# Patient Record
Sex: Female | Born: 1965 | Race: White | Hispanic: No | Marital: Married | Smoking: Current every day smoker
Health system: Southern US, Community
[De-identification: ages and names within clinical notes are randomized; demographics above are authoritative.]

## PROBLEM LIST (undated history)

## (undated) DIAGNOSIS — I10 Essential (primary) hypertension: Secondary | ICD-10-CM

## (undated) DIAGNOSIS — I509 Heart failure, unspecified: Secondary | ICD-10-CM

## (undated) DIAGNOSIS — M1990 Unspecified osteoarthritis, unspecified site: Secondary | ICD-10-CM

## (undated) DIAGNOSIS — J45909 Unspecified asthma, uncomplicated: Secondary | ICD-10-CM

## (undated) HISTORY — PX: TUBAL LIGATION: SHX77

## (undated) HISTORY — PX: CHOLECYSTECTOMY: SHX55

## (undated) HISTORY — DX: Essential (primary) hypertension: I10

## (undated) HISTORY — DX: Unspecified osteoarthritis, unspecified site: M19.90

## (undated) HISTORY — PX: APPENDECTOMY: SHX54

## (undated) HISTORY — DX: Heart failure, unspecified: I50.9

## (undated) HISTORY — DX: Unspecified asthma, uncomplicated: J45.909

---

## 2018-05-02 DIAGNOSIS — I161 Hypertensive emergency: Secondary | ICD-10-CM | POA: Insufficient documentation

## 2020-07-30 ENCOUNTER — Encounter: Payer: Self-pay | Admitting: Internal Medicine

## 2020-07-30 ENCOUNTER — Ambulatory Visit (INDEPENDENT_AMBULATORY_CARE_PROVIDER_SITE_OTHER): Payer: 59 | Admitting: Internal Medicine

## 2020-07-30 ENCOUNTER — Other Ambulatory Visit: Payer: Self-pay

## 2020-07-30 VITALS — BP 139/85 | HR 82 | Temp 98.0°F | Ht 62.72 in | Wt 235.0 lb

## 2020-07-30 DIAGNOSIS — I509 Heart failure, unspecified: Secondary | ICD-10-CM | POA: Diagnosis not present

## 2020-07-30 DIAGNOSIS — L0291 Cutaneous abscess, unspecified: Secondary | ICD-10-CM | POA: Diagnosis not present

## 2020-07-30 DIAGNOSIS — Z1239 Encounter for other screening for malignant neoplasm of breast: Secondary | ICD-10-CM | POA: Diagnosis not present

## 2020-07-30 LAB — URINALYSIS, ROUTINE W REFLEX MICROSCOPIC
Bilirubin, UA: NEGATIVE
Glucose, UA: NEGATIVE
Ketones, UA: NEGATIVE
Leukocytes,UA: NEGATIVE
Nitrite, UA: NEGATIVE
Protein,UA: NEGATIVE
Specific Gravity, UA: 1.015 (ref 1.005–1.030)
Urobilinogen, Ur: 0.2 mg/dL (ref 0.2–1.0)
pH, UA: 6 (ref 5.0–7.5)

## 2020-07-30 LAB — BAYER DCA HB A1C WAIVED: HB A1C (BAYER DCA - WAIVED): 5.9 %

## 2020-07-30 LAB — MICROSCOPIC EXAMINATION
Bacteria, UA: NONE SEEN
WBC, UA: NONE SEEN /HPF (ref 0–5)

## 2020-07-30 MED ORDER — HYDROCHLOROTHIAZIDE 12.5 MG PO CAPS: 12.5000 mg | ORAL_CAPSULE | Freq: Every day | ORAL | 3 refills | Status: DC

## 2020-07-30 MED ORDER — NYSTATIN 100000 UNIT/GM EX CREA: 1.0000 "application " | TOPICAL_CREAM | Freq: Two times a day (BID) | CUTANEOUS | 1 refills | Status: DC

## 2020-07-30 MED ORDER — SULFAMETHOXAZOLE-TRIMETHOPRIM 800-160 MG PO TABS: 1.0000 | ORAL_TABLET | Freq: Two times a day (BID) | ORAL | 0 refills | Status: AC

## 2020-07-30 MED ORDER — NAPROXEN 500 MG PO TABS: 500.0000 mg | ORAL_TABLET | Freq: Two times a day (BID) | ORAL | 2 refills | Status: DC | PRN

## 2020-07-30 NOTE — Progress Notes (Signed)
BP 139/85   Pulse 82   Temp 98 F (36.7 C) (Oral)   Ht 5' 2.72" (1.593 m)   Wt 235 lb (106.6 kg)   LMP  (LMP Unknown)   SpO2 98%   BMI 42.00 kg/m    Subjective:    Patient ID: Jodi Keith, female    DOB: 12/30/1965, 55 y.o.   MRN: [id number]  HPI: Jodi Keith is a 55 y.o. female  Pt is here to establish care - hasnt been seen  She has a ho of CHF not on meds currently has left lower ext, CAD  , asthma, and tobacco use presenting to the ED x 4 /4 for 1 month of left-sided flank pain and swelling as well as with 3 days of increased urinary frequency and urgency that acutely worsened yesterday afternoon  Per pt notcied some lumps / has pus and discharge from the ;left breast with erythema under bil breasts.   Hypertension This is a chronic problem. The problem is resistant. Pertinent negatives include no anxiety, blurred vision, chest pain, headaches, malaise/fatigue, neck pain, orthopnea, palpitations, peripheral edema, PND, shortness of breath or sweats.  Abdominal Pain This is a recurrent problem. The current episode started more than 1 month ago. The problem has been waxing and waning. The pain is located in the LUQ. The pain is at a severity of 5/10. The pain is moderate. The quality of the pain is dull. The abdominal pain does not radiate. Pertinent negatives include no flatus, headaches, melena, myalgias, nausea, vomiting or weight loss.    Chief Complaint  Patient presents with  . New Patient (Initial Visit)    UC week ago, pain left side under breast with swelling, started 2 months just now getting worse, at the worst when she lays down. Found blood in urine, went to ED, found cyst in right ovary. Has family h/o cancer, seeing gyno on 4/18. Trouble sleeping mind does not stop, has restless legs, take ibuprofen daily, HTN. Father, sister, stepmother and cousins have pasted in past 2 years.Left leg swelling    Relevant past medical, surgical, family and social history  reviewed and updated as indicated. Interim medical history since our last visit reviewed. Allergies and medications reviewed and updated.  Review of Systems  Constitutional: Negative for malaise/fatigue and weight loss.  Eyes: Negative for blurred vision.  Respiratory: Negative for shortness of breath.   Cardiovascular: Negative for chest pain, palpitations, orthopnea and PND.  Gastrointestinal: Positive for abdominal pain. Negative for flatus, melena, nausea and vomiting.  Musculoskeletal: Negative for myalgias and neck pain.  Neurological: Negative for headaches.    Per HPI unless specifically indicated above     Objective:    BP 139/85   Pulse 82   Temp 98 F (36.7 C) (Oral)   Ht 5' 2.72" (1.593 m)   Wt 235 lb (106.6 kg)   LMP  (LMP Unknown)   SpO2 98%   BMI 42.00 kg/m   Wt Readings from Last 3 Encounters:  07/30/20 235 lb (106.6 kg)    Physical Exam Vitals and nursing note reviewed.  Constitutional:      General: She is not in acute distress.    Appearance: Normal appearance. She is not ill-appearing or diaphoretic.  HENT:     Head: Normocephalic and atraumatic.     Right Ear: Tympanic membrane and external ear normal. There is no impacted cerumen.     Left Ear: External ear normal.     Nose: No congestion  or rhinorrhea.     Mouth/Throat:     Pharynx: No oropharyngeal exudate or posterior oropharyngeal erythema.  Eyes:     Conjunctiva/sclera: Conjunctivae normal.     Pupils: Pupils are equal, round, and reactive to light.  Cardiovascular:     Rate and Rhythm: Normal rate and regular rhythm.     Heart sounds: No murmur heard. No friction rub. No gallop.   Pulmonary:     Effort: No respiratory distress.     Breath sounds: No stridor. No wheezing or rhonchi.  Chest:     Chest wall: No tenderness.  Abdominal:     General: Bowel sounds are normal. There is no distension.     Palpations: Abdomen is soft. There is no shifting dullness, hepatomegaly or mass.      Tenderness: There is no abdominal tenderness. There is no guarding.  Musculoskeletal:        General: No swelling or deformity.     Cervical back: Normal range of motion and neck supple. No rigidity or tenderness.     Right lower leg: No edema.     Left lower leg: No edema.  Skin:    General: Skin is warm and dry.     Coloration: Skin is not jaundiced.     Findings: Erythema and rash present.     Comments: umps / has pus and discharge from the ;left breast with erythema under bil breasts.   Neurological:     Mental Status: She is alert and oriented to person, place, and time. Mental status is at baseline.  Psychiatric:        Mood and Affect: Mood normal.        Behavior: Behavior normal.        Thought Content: Thought content normal.        Judgment: Judgment normal.     No results found for this or any previous visit.      Current Outpatient Medications:  .  amLODipine (NORVASC) 10 MG tablet, Take 1 tablet by mouth daily., Disp: , Rfl:  .  losartan (COZAAR) 25 MG tablet, Take 1 tablet by mouth daily., Disp: , Rfl:   3.8 cm right ovarian cyst is noted. Recommend followup US in 3-6  months. Note: This recommendation does not apply to premenarchal  patients or to those with increased risk (genetic, family history,  elevated tumor markers or other high-risk factors) of ovarian  cancer. Reference: Radiology 2019 Nov; 293(2):359-371.    Electronically Signed  By: Lupita Raider M.D.  On: 07/21/2020 13:24  Narrative Performed by [facility] CLINICAL DATA: Acute left upper quadrant abdominal pain.   EXAM:  TRANSABDOMINAL AND TRANSVAGINAL ULTRASOUND OF PELVIS   DOPPLER ULTRASOUND OF OVARIES   TECHNIQUE:  Both transabdominal and transvaginal ultrasound examinations of the  pelvis were performed. Transabdominal technique was performed for  global imaging of the pelvis including uterus, ovaries, adnexal  regions, and pelvic cul-de-sac.   It was necessary to proceed with  endovaginal exam following the  transabdominal exam to visualize the endometrium and ovaries. Color  and duplex Doppler ultrasound was utilized to evaluate blood flow to  the ovaries.   COMPARISON: CT of same day.   FINDINGS:  Uterus   Measurements: 7.2 x 4.3 x 3.4 cm = volume: 55 mL. No fibroids or  other mass visualized.   Endometrium   Thickness: 8 mm which is within normal limits. No focal abnormality  visualized.   Right ovary   Measurements: 4.6  x 3.8 x 3.1 cm = volume: 28 mL. 3.8 cm cyst is  noted.   Left ovary   Not visualized.   Pulsed Doppler evaluation of right ovary demonstrates normal  low-resistance arterial and venous waveforms.   Other findings   No abnormal free fluid.  Procedure Note  Orlene OchGreen, James Judd, MD - 07/21/2020  Formatting of this note might be different from the original.  CLINICAL DATA: Acute left upper quadrant abdominal pain.   EXAM:  TRANSABDOMINAL AND TRANSVAGINAL ULTRASOUND OF PELVIS   DOPPLER ULTRASOUND OF OVARIES   TECHNIQUE:  Both transabdominal and transvaginal ultrasound examinations of the  pelvis were performed. Transabdominal technique was performed for  global imaging of the pelvis including uterus, ovaries, adnexal  regions, and pelvic cul-de-sac.   It was necessary to proceed with endovaginal exam following the  transabdominal exam to visualize the endometrium and ovaries. Color  and duplex Doppler ultrasound was utilized to evaluate blood flow to  the ovaries.   COMPARISON: CT of same day.   FINDINGS:  Uterus   Measurements: 7.2 x 4.3 x 3.4 cm = volume: 55 mL. No fibroids or  other mass visualized.   Endometrium   Thickness: 8 mm which is within normal limits. No focal abnormality  visualized.   Right ovary   Measurements: 4.6 x 3.8 x 3.1 cm = volume: 28 mL. 3.8 cm cyst is  noted.   Left ovary   Not visualized.   Pulsed Doppler evaluation of right ovary demonstrates normal  low-resistance  arterial and venous waveforms.   Other findings   No abnormal free fluid.   IMPRESSION:  3.8 cm right ovarian cyst is noted. Recommend followup US in 3-6  months. Note: This recommendation does not apply to premenarchal  patients or to those with increased risk (genetic, family history,  elevated tumor markers or other high-risk factors) of ovarian  cancer. Reference: Radiology 2019 Nov; 293(2):359-371.     Assessment & Plan:  1. Breast abcess  Will start pt on bactrim DS. noncontast CT scan - spleen wnl , no paraspleenic per read @ [facility]  Pain left sided LUQ / Lower chest  ? Sec to musculoskeletal sec to morbid obesity per radiologist@ [facility] , recommend  Iv contrast CT abdomen only if she is worse Has had a ? Previous infection in rt kidney. Will start pt on naproxen for pain  She has had -ve xray ribs  Has had multiple images and per radiology MD, pt doesn't need any further imaging unless she gets worse . Will refert to cards for wu of pain as well.  2. CHF /lower extremity swelling well start patient on HCTZ  refer to cardiology ASAP for possible echo/ stress test.   3. morbid obesity Lifestyle modifications advised to pt. A1c at   Portion control and avoiding high carb low fat diet advised.  Diet plan given to pt   exercise plan given and encouraged.  To increase exercise to 150 mins a week ie 21/2 hours a week. Pt verbalises understanding of the above.    4. Smoking cessation Smoking cessation advised. pt refuses chantix. failed nicotine patches in the past. continues to smoke. more than > 5 - 10 mins of time was spent with pt regarding smoking cessation and complications.      Problem List Items Addressed This Visit   None   Visit Diagnoses    Congestive heart failure, unspecified HF chronicity, unspecified heart failure type (HCC)    -  Primary   Relevant Medications   amLODipine (NORVASC) 10 MG tablet   losartan (COZAAR) 25 MG tablet   Other Relevant Orders    Ambulatory referral to Cardiology   CBC with Differential/Platelet   Comprehensive metabolic panel   Lipid panel   TSH   Urinalysis, Routine w reflex microscopic   Morbid obesity (HCC)       Relevant Orders   Bayer DCA Hb A1c Waived   Encounter for screening for malignant neoplasm of breast, unspecified screening modality       Relevant Orders   MM 3D SCREEN BREAST BILATERAL       Follow up plan: Return in about 1 week (around 08/06/2020).

## 2020-07-31 LAB — COMPREHENSIVE METABOLIC PANEL
ALT: 23 IU/L (ref 0–32)
AST: 20 IU/L (ref 0–40)
Albumin/Globulin Ratio: 1.3 (ref 1.2–2.2)
Albumin: 4.3 g/dL (ref 3.8–4.9)
Alkaline Phosphatase: 149 IU/L — ABNORMAL HIGH (ref 44–121)
BUN/Creatinine Ratio: 11 (ref 9–23)
BUN: 11 mg/dL (ref 6–24)
Bilirubin Total: 0.3 mg/dL (ref 0.0–1.2)
CO2: 21 mmol/L (ref 20–29)
Calcium: 9.5 mg/dL (ref 8.7–10.2)
Chloride: 101 mmol/L (ref 96–106)
Creatinine, Ser: 1.04 mg/dL — ABNORMAL HIGH (ref 0.57–1.00)
Globulin, Total: 3.2 g/dL (ref 1.5–4.5)
Glucose: 90 mg/dL (ref 65–99)
Potassium: 4.3 mmol/L (ref 3.5–5.2)
Sodium: 139 mmol/L (ref 134–144)
Total Protein: 7.5 g/dL (ref 6.0–8.5)
eGFR: 64 mL/min/{1.73_m2} (ref >59)

## 2020-07-31 LAB — CBC WITH DIFFERENTIAL/PLATELET
Basophils Absolute: 0.1 10*3/uL (ref 0.0–0.2)
Basos: 1 %
EOS (ABSOLUTE): 0.1 10*3/uL (ref 0.0–0.4)
Eos: 2 %
Hematocrit: 48.7 % — ABNORMAL HIGH (ref 34.0–46.6)
Hemoglobin: 16.3 g/dL — ABNORMAL HIGH (ref 11.1–15.9)
Immature Grans (Abs): 0 10*3/uL (ref 0.0–0.1)
Immature Granulocytes: 0 %
Lymphocytes Absolute: 2.8 10*3/uL (ref 0.7–3.1)
Lymphs: 37 %
MCH: 29.9 pg (ref 26.6–33.0)
MCHC: 33.5 g/dL (ref 31.5–35.7)
MCV: 89 fL (ref 79–97)
Monocytes Absolute: 0.4 10*3/uL (ref 0.1–0.9)
Monocytes: 6 %
Neutrophils Absolute: 4.1 10*3/uL (ref 1.4–7.0)
Neutrophils: 54 %
Platelets: 335 10*3/uL (ref 150–450)
RBC: 5.46 x10E6/uL — ABNORMAL HIGH (ref 3.77–5.28)
RDW: 14.1 % (ref 11.7–15.4)
WBC: 7.5 10*3/uL (ref 3.4–10.8)

## 2020-07-31 LAB — LIPID PANEL
Chol/HDL Ratio: 4.7 ratio — ABNORMAL HIGH (ref 0.0–4.4)
Cholesterol, Total: 183 mg/dL (ref 100–199)
HDL: 39 mg/dL — ABNORMAL LOW (ref >39)
LDL Chol Calc (NIH): 115 mg/dL — ABNORMAL HIGH (ref 0–99)
Triglycerides: 162 mg/dL — ABNORMAL HIGH (ref 0–149)
VLDL Cholesterol Cal: 29 mg/dL (ref 5–40)

## 2020-07-31 LAB — TSH: TSH: 7.72 u[IU]/mL — ABNORMAL HIGH (ref 0.450–4.500)

## 2020-08-04 ENCOUNTER — Other Ambulatory Visit (HOSPITAL_COMMUNITY)
Admission: RE | Admit: 2020-08-04 | Discharge: 2020-08-04 | Disposition: A | Discharge status: Home / Self Care | Payer: 59 | Source: Ambulatory Visit | Attending: Obstetrics and Gynecology | Admitting: Obstetrics and Gynecology

## 2020-08-04 ENCOUNTER — Ambulatory Visit (INDEPENDENT_AMBULATORY_CARE_PROVIDER_SITE_OTHER): Payer: 59 | Admitting: Obstetrics and Gynecology

## 2020-08-04 ENCOUNTER — Encounter: Payer: Self-pay | Admitting: Obstetrics and Gynecology

## 2020-08-04 ENCOUNTER — Other Ambulatory Visit: Payer: Self-pay

## 2020-08-04 ENCOUNTER — Telehealth: Payer: Self-pay | Admitting: Internal Medicine

## 2020-08-04 VITALS — BP 126/84 | Wt 237.0 lb

## 2020-08-04 DIAGNOSIS — N83201 Unspecified ovarian cyst, right side: Secondary | ICD-10-CM

## 2020-08-04 DIAGNOSIS — Z124 Encounter for screening for malignant neoplasm of cervix: Secondary | ICD-10-CM

## 2020-08-04 NOTE — Telephone Encounter (Signed)
Pt is calling to follow up regarding referral to cardiology and her mamogram. Pt was told to have her mamogram before she returns this Wednesday.  Cb- [phone]

## 2020-08-04 NOTE — Telephone Encounter (Signed)
Advised cardiology referral is waiting on notes she can call [place] to schedule

## 2020-08-04 NOTE — Progress Notes (Signed)
Obstetrics & Gynecology Office Visit    Chief Complaint  Patient presents with  . Follow-up  . Ovarian Cyst  Right ovarian cyst  History of Present Illness: 55 y.o. G58P3003 female who presents in follow up from an ER visit where a right ovarian cyst was found. She went to the ER on 07/21/20 due to left lower quadrant pain and lower back pain.  The pain in her back starts from her mid sacrum to her lateral right gluteal muscle.  She had blood noted in her urine. So, she had a stone-protocol CT scan, which showed a right ovarian cyst.  A pelvic ultrasound was utilized to get a better view of her right ovary.  An uncharacterized right ovarian cyst was noted (3.8 cm).  She doesn't remember the last time she had an ultrasound of her pelvis. Her mother had ovarian cancer.  Her mother had cancer at multiple sites. It is hard to determine where the primary was located or whether there was more than one primary.  On CT scan her lymph nodes were not noted to be enlarged.  No ascites was noted. No comment regarding omental caking was noted.   She notes weight gain. She believes the weight gain is "a lot." She has recently been placed on a "fluid" pill and she has lost weight on that.  She denies new constipation.  She states that she hasn't had a solid bowel movement in 3 years.  She notes new bloating in the past months. She does notice early satiety.    She smokes 1 ppd x 28 years.   Past Medical History:  Diagnosis Date  . Arthritis   . Asthma   . CHF (congestive heart failure) (HCC)   . Hypertension     Past Surgical History:  Procedure Laterality Date  . APPENDECTOMY    . CHOLECYSTECTOMY    . TUBAL LIGATION      Gynecologic History: No LMP recorded (lmp unknown). (Menstrual status: Other).  Obstetric History: [id number], s/p SVD x 3  Family History  Problem Relation Age of Onset  . Non-Hodgkin's lymphoma Mother   . Lung cancer Mother   . Thyroid cancer Mother   . Esophageal cancer  Mother   . Stomach cancer Mother   . Ovarian cancer Mother   . Skin cancer Mother   . Heart failure Mother   . Heart disease Mother   . Hypertension Mother   . Stroke Mother   . Cancer Mother   . Arthritis Mother   . Asthma Mother   . COPD Mother   . Early death Mother   . Heart attack Father   . Diabetes Father   . Kidney disease Father   . Heart disease Father   . Hypertension Father   . Heart failure Father   . Thyroid disease Sister   . Anxiety disorder Sister   . Depression Sister   . Panic disorder Sister   . Diabetes Sister   . Suicidality Sister   . Early death Sister   . Diabetes Brother   . Other Brother        drug abuse  . Heart disease Maternal Grandmother   . Stroke Maternal Grandmother   . Cancer Maternal Grandfather   . Heart disease Maternal Grandfather   . Stroke Maternal Grandfather   . Alzheimer's disease Paternal Grandmother   . Diabetes Paternal Grandmother   . Hypertension Paternal Grandmother   . Heart disease Paternal Grandfather  Social History   Socioeconomic History  . Marital status: Married    Spouse name: Not on file  . Number of children: Not on file  . Years of education: Not on file  . Highest education level: Not on file  Occupational History  . Not on file  Tobacco Use  . Smoking status: Current Every Day Smoker    Packs/day: 1.00  . Smokeless tobacco: Never Used  Vaping Use  . Vaping Use: Never used  Substance and Sexual Activity  . Alcohol use: Never  . Drug use: Never  . Sexual activity: Yes  Other Topics Concern  . Not on file  Social History Narrative  . Not on file   Social Determinants of Health   Financial Resource Strain: Not on file  Food Insecurity: Not on file  Transportation Needs: Not on file  Physical Activity: Not on file  Stress: Not on file  Social Connections: Not on file  Intimate Partner Violence: Not on file    Allergies  Allergen Reactions  . Prochlorperazine Edisylate  Anaphylaxis    coma    Prior to Admission medications   Medication Sig Start Date End Date Taking? Authorizing Provider  amLODipine (NORVASC) 10 MG tablet Take 1 tablet by mouth daily. 07/21/20 07/21/21  [provider]  hydrochlorothiazide (MICROZIDE) 12.5 MG capsule Take 1 capsule (12.5 mg total) by mouth daily. 07/30/20   Vigg, Avanti, MD  losartan (COZAAR) 25 MG tablet Take 1 tablet by mouth daily. 07/21/20 08/20/20  [provider]  naproxen (NAPROSYN) 500 MG tablet Take 1 tablet (500 mg total) by mouth 2 (two) times daily as needed. 07/30/20   Vigg, Avanti, MD  nystatin cream (MYCOSTATIN) Apply 1 application topically 2 (two) times daily. 07/30/20   Vigg, Avanti, MD  sulfamethoxazole-trimethoprim (BACTRIM DS) 800-160 MG tablet Take 1 tablet by mouth 2 (two) times daily for 7 days. 07/30/20 08/06/20  [name], MD    Review of Systems  Constitutional: Negative.   HENT: Negative.   Eyes: Negative.   Respiratory: Negative.   Cardiovascular: Negative.   Gastrointestinal: Negative.   Genitourinary: Negative.   Musculoskeletal: Negative.   Skin: Negative.   Neurological: Negative.   Psychiatric/Behavioral: Negative.      Physical Exam BP 126/84   Wt 237 lb (107.5 kg)   LMP  (LMP Unknown)   BMI 42.36 kg/m  No LMP recorded (lmp unknown). (Menstrual status: Other). Physical Exam Constitutional:      General: She is not in acute distress.    Appearance: Normal appearance. She is well-developed.  Genitourinary:     Vulva, bladder and urethral meatus normal.     Right Labia: No rash, tenderness, lesions, skin changes or Bartholin's cyst.    Left Labia: No tenderness, skin changes, Bartholin's cyst or rash.    No inguinal adenopathy present in the right or left side.    Pelvic Tanner Score: 5/5.     Right Adnexa: not tender, not full and no mass present.    Left Adnexa: not tender, not full and no mass present.    No cervical motion tenderness, friability, lesion or  polyp.     Uterus is not enlarged, fixed or tender.     Uterus exam comments: Exam limited by body habitus.     Uterus is anteverted.     No urethral tenderness or mass present.     Pelvic exam was performed with patient in the lithotomy position.  HENT:     Head:  Normocephalic and atraumatic.  Eyes:     General: No scleral icterus.    Conjunctiva/sclera: Conjunctivae normal.  Cardiovascular:     Rate and Rhythm: Normal rate and regular rhythm.     Heart sounds: No murmur heard. No friction rub. No gallop.   Pulmonary:     Effort: Pulmonary effort is normal. No respiratory distress.     Breath sounds: Normal breath sounds. No wheezing or rales.  Abdominal:     General: Bowel sounds are normal. There is no distension.     Palpations: Abdomen is soft. There is no mass.     Tenderness: There is no abdominal tenderness. There is no guarding or rebound.     Hernia: There is no hernia in the left inguinal area or right inguinal area.  Musculoskeletal:        General: Normal range of motion.     Cervical back: Normal range of motion and neck supple.  Lymphadenopathy:     Lower Body: No right inguinal adenopathy. No left inguinal adenopathy.  Neurological:     General: No focal deficit present.     Mental Status: She is alert and oriented to person, place, and time.     Cranial Nerves: No cranial nerve deficit.  Skin:    General: Skin is warm and dry.     Findings: No erythema.  Psychiatric:        Mood and Affect: Mood normal.        Behavior: Behavior normal.        Judgment: Judgment normal.    Female chaperone present for pelvic and breast  portions of the physical exam  I personally reviewed the images from [facility] and the pelvic ultrasound shows a right ovarian, simple-appearing cyst that measures 2.74 x 2.95 x 3.7 cm cyst with no internal blood flow on doppler.  No ascites noted.  The endometrial stripe is about 8 mm. However, she has had no bleeding in years.   Assessment: 55  y.o. G44P3003 female here for  1. Right ovarian cyst   2. Pap smear for cervical cancer screening      Plan: Problem List Items Addressed This Visit   None   Visit Diagnoses    Right ovarian cyst    -  Primary   Relevant Orders   US PELVIS TRANSVAGINAL NON-OB (TV ONLY)   Pap smear for cervical cancer screening       Relevant Orders   Cytology - PAP     Discussed that I could not tell her for sure that her cyst is not malignant. However, the features on CT scan and ultrasound are very reassuring.   I strongly encouraged her to follow up for a pelvic ultrasound in 3-6 months to verify resolution or stability of this cyst. She does not want to go to the OR to have this removed.  We discussed that if the ultrasound showed interval growth or interval development of concerning features, we could need to move to surgery for pathologic assessment.    A total of 45 minutes were spent face-to-face with the patient as well as preparation, review of outside records and images, and documentation during this encounter.    [name], MD 08/04/2020 3:31 PM

## 2020-08-05 ENCOUNTER — Encounter: Payer: Self-pay | Admitting: Internal Medicine

## 2020-08-05 ENCOUNTER — Ambulatory Visit (INDEPENDENT_AMBULATORY_CARE_PROVIDER_SITE_OTHER): Payer: 59 | Admitting: Internal Medicine

## 2020-08-05 ENCOUNTER — Telehealth: Payer: Self-pay

## 2020-08-05 VITALS — BP 118/83 | HR 91 | Temp 98.1°F | Ht 62.8 in | Wt 235.6 lb

## 2020-08-05 DIAGNOSIS — R609 Edema, unspecified: Secondary | ICD-10-CM

## 2020-08-05 DIAGNOSIS — I509 Heart failure, unspecified: Secondary | ICD-10-CM

## 2020-08-05 DIAGNOSIS — Z1239 Encounter for other screening for malignant neoplasm of breast: Secondary | ICD-10-CM | POA: Diagnosis not present

## 2020-08-05 DIAGNOSIS — Z23 Encounter for immunization: Secondary | ICD-10-CM | POA: Diagnosis not present

## 2020-08-05 MED ORDER — FAMOTIDINE 20 MG PO TABS: 20.0000 mg | ORAL_TABLET | Freq: Every day | ORAL | 3 refills | Status: DC

## 2020-08-05 MED ORDER — LOSARTAN POTASSIUM 50 MG PO TABS: 50.0000 mg | ORAL_TABLET | Freq: Every day | ORAL | 3 refills | Status: DC

## 2020-08-05 NOTE — Telephone Encounter (Signed)
Copied from CRM [id number]. Topic: Referral - Question >> Aug 05, 2020  2:33 PM Aretta Nip wrote: Reason for CRM: Pt is calling as referral was just placed for cardiologist and she was contacted but was told her insurance is not accepted. She was told that the preferred office would be [facility] in [place] or either but not preferred, [facility] in [place]. Please advise pt at [phone] States Vigg was trying to get her in.

## 2020-08-05 NOTE — Progress Notes (Signed)
BP 118/83   Pulse 91   Temp 98.1 F (36.7 C) (Oral)   Ht 5' 2.8" (1.595 m)   Wt 235 lb 9.6 oz (106.9 kg)   LMP  (LMP Unknown)   SpO2 100%   BMI 42.01 kg/m    Subjective:    Patient ID: Jodi Keith, female    DOB: Nov 27, 1965, 55 y.o.   MRN: [id number]  HPI: Jodi Keith is a 55 y.o. female  Breast abscess improving per pt no dratinage from abscess mammogram due, to fu with cardiology  Ho cholecystectomy 15 yrs ago. Saw the ob gyn says has an ovarian  Cyst to recheck [place] in 3 months. Sees them @ [facility]  Still co Left upper quadrant pain  Congestive Heart Failure Presents for initial (has soboe about 1/2 a mile. ) visit. Associated symptoms include edema and shortness of breath. Pertinent negatives include no abdominal pain, chest pain, chest pressure, fatigue, muscle weakness, near-syncope, nocturia, orthopnea, palpitations, paroxysmal nocturnal dyspnea or unexpected weight change.  Thyroid Problem Presents for initial visit. Symptoms include anxiety, cold intolerance, dry skin, hair loss, nail problem and weight gain. Patient reports no constipation, depressed mood, diaphoresis, diarrhea, fatigue, heat intolerance, hoarse voice, leg swelling, menstrual problem, palpitations, tremors, visual change or weight loss.    Chief Complaint  Patient presents with  . Congestive Heart Failure    Patient is here to go over lab work    Relevant past medical, surgical, family and social history reviewed and updated as indicated. Interim medical history since our last visit reviewed. Allergies and medications reviewed and updated.  Review of Systems  Constitutional: Positive for weight gain. Negative for activity change, appetite change, chills, diaphoresis, fatigue, fever, unexpected weight change and weight loss.  HENT: Negative for congestion and hoarse voice.   Eyes: Negative for visual disturbance.  Respiratory: Positive for shortness of breath. Negative for apnea,  cough, chest tightness and wheezing.   Cardiovascular: Negative for chest pain, palpitations, leg swelling and near-syncope.  Gastrointestinal: Negative for abdominal distention, abdominal pain, constipation, diarrhea and nausea.  Endocrine: Positive for cold intolerance. Negative for heat intolerance, polydipsia, polyphagia and polyuria.  Genitourinary: Negative for difficulty urinating, frequency, hematuria, menstrual problem, nocturia and urgency.  Musculoskeletal: Negative for muscle weakness.  Skin: Negative for color change and rash.  Neurological: Negative for dizziness, tremors, speech difficulty, weakness, light-headedness, numbness and headaches.  Psychiatric/Behavioral: Negative for behavioral problems and confusion. The patient is nervous/anxious.     Per HPI unless specifically indicated above     Objective:    BP 118/83   Pulse 91   Temp 98.1 F (36.7 C) (Oral)   Ht 5' 2.8" (1.595 m)   Wt 235 lb 9.6 oz (106.9 kg)   LMP  (LMP Unknown)   SpO2 100%   BMI 42.01 kg/m   Wt Readings from Last 3 Encounters:  08/05/20 235 lb 9.6 oz (106.9 kg)  08/04/20 237 lb (107.5 kg)  07/30/20 235 lb (106.6 kg)    Physical Exam Vitals and nursing note reviewed.  Constitutional:      General: She is not in acute distress.    Appearance: Normal appearance. She is not ill-appearing or diaphoretic.  HENT:     Head: Normocephalic and atraumatic.     Right Ear: Tympanic membrane and external ear normal. There is no impacted cerumen.     Left Ear: External ear normal.     Nose: No congestion or rhinorrhea.     Mouth/Throat:  Pharynx: No oropharyngeal exudate or posterior oropharyngeal erythema.  Eyes:     Conjunctiva/sclera: Conjunctivae normal.     Pupils: Pupils are equal, round, and reactive to light.  Cardiovascular:     Rate and Rhythm: Normal rate and regular rhythm.     Heart sounds: No murmur heard. No friction rub. No gallop.   Pulmonary:     Effort: No respiratory  distress.     Breath sounds: No stridor. No wheezing or rhonchi.  Chest:     Chest wall: No tenderness.  Abdominal:     General: Abdomen is flat. Bowel sounds are normal. There is no distension.     Palpations: Abdomen is soft. There is no mass.     Tenderness: There is no abdominal tenderness. There is no guarding.  Musculoskeletal:        General: No swelling or deformity.     Cervical back: Normal range of motion and neck supple. No rigidity or tenderness.     Right lower leg: No edema.     Left lower leg: No edema.  Skin:    General: Skin is warm.     Coloration: Skin is not jaundiced.  Neurological:     Mental Status: She is alert and oriented to person, place, and time. Mental status is at baseline.  Psychiatric:        Mood and Affect: Mood normal.        Behavior: Behavior normal.        Thought Content: Thought content normal.        Judgment: Judgment normal.     Results for orders placed or performed in visit on 07/30/20  Microscopic Examination   Urine  Result Value Ref Range   WBC, UA None seen 0 - 5 /hpf   RBC 0-2 0 - 2 /hpf   Epithelial Cells (non renal) 0-10 0 - 10 /hpf   Bacteria, UA None seen None seen/Few  CBC with Differential/Platelet  Result Value Ref Range   WBC 7.5 3.4 - 10.8 x10E3/uL   RBC 5.46 (H) 3.77 - 5.28 x10E6/uL   Hemoglobin 16.3 (H) 11.1 - 15.9 g/dL   Hematocrit 65.8 (H) 00.6 - 46.6 %   MCV 89 79 - 97 fL   MCH 29.9 26.6 - 33.0 pg   MCHC 33.5 31.5 - 35.7 g/dL   RDW 34.9 49.4 - 47.3 %   Platelets 335 150 - 450 x10E3/uL   Neutrophils 54 Not Estab. %   Lymphs 37 Not Estab. %   Monocytes 6 Not Estab. %   Eos 2 Not Estab. %   Basos 1 Not Estab. %   Neutrophils Absolute 4.1 1.4 - 7.0 x10E3/uL   Lymphocytes Absolute 2.8 0.7 - 3.1 x10E3/uL   Monocytes Absolute 0.4 0.1 - 0.9 x10E3/uL   EOS (ABSOLUTE) 0.1 0.0 - 0.4 x10E3/uL   Basophils Absolute 0.1 0.0 - 0.2 x10E3/uL   Immature Granulocytes 0 Not Estab. %   Immature Grans (Abs) 0.0 0.0 -  0.1 x10E3/uL  Comprehensive metabolic panel  Result Value Ref Range   Glucose 90 65 - 99 mg/dL   BUN 11 6 - 24 mg/dL   Creatinine, Ser 9.58 (H) 0.57 - 1.00 mg/dL   eGFR 64 >44 BN/LWH/8.71   BUN/Creatinine Ratio 11 9 - 23   Sodium 139 134 - 144 mmol/L   Potassium 4.3 3.5 - 5.2 mmol/L   Chloride 101 96 - 106 mmol/L   CO2 21 20 - 29 mmol/L  Calcium 9.5 8.7 - 10.2 mg/dL   Total Protein 7.5 6.0 - 8.5 g/dL   Albumin 4.3 3.8 - 4.9 g/dL   Globulin, Total 3.2 1.5 - 4.5 g/dL   Albumin/Globulin Ratio 1.3 1.2 - 2.2   Bilirubin Total 0.3 0.0 - 1.2 mg/dL   Alkaline Phosphatase 149 (H) 44 - 121 IU/L   AST 20 0 - 40 IU/L   ALT 23 0 - 32 IU/L  Lipid panel  Result Value Ref Range   Cholesterol, Total 183 100 - 199 mg/dL   Triglycerides 162 (H) 0 - 149 mg/dL   HDL 39 (L) >39 mg/dL   VLDL Cholesterol Cal 29 5 - 40 mg/dL   LDL Chol Calc (NIH) 115 (H) 0 - 99 mg/dL   Chol/HDL Ratio 4.7 (H) 0.0 - 4.4 ratio  TSH  Result Value Ref Range   TSH 7.720 (H) 0.450 - 4.500 uIU/mL  Urinalysis, Routine w reflex microscopic  Result Value Ref Range   Specific Gravity, UA 1.015 1.005 - 1.030   pH, UA 6.0 5.0 - 7.5   Color, UA Yellow Yellow   Appearance Ur Cloudy (A) Clear   Leukocytes,UA Negative Negative   Protein,UA Negative Negative/Trace   Glucose, UA Negative Negative   Ketones, UA Negative Negative   RBC, UA Trace (A) Negative   Bilirubin, UA Negative Negative   Urobilinogen, Ur 0.2 0.2 - 1.0 mg/dL   Nitrite, UA Negative Negative   Microscopic Examination See below:   Bayer DCA Hb A1c Waived  Result Value Ref Range   HB A1C (BAYER DCA - WAIVED) 5.9 <7.0 %        Current Outpatient Medications:  .  amLODipine (NORVASC) 10 MG tablet, Take 1 tablet by mouth daily., Disp: , Rfl:  .  hydrochlorothiazide (MICROZIDE) 12.5 MG capsule, Take 1 capsule (12.5 mg total) by mouth daily., Disp: 30 capsule, Rfl: 3 .  losartan (COZAAR) 25 MG tablet, Take 1 tablet by mouth daily., Disp: , Rfl:  .  nystatin  cream (MYCOSTATIN), Apply 1 application topically 2 (two) times daily., Disp: 30 g, Rfl: 1 .  sulfamethoxazole-trimethoprim (BACTRIM DS) 800-160 MG tablet, Take 1 tablet by mouth 2 (two) times daily for 7 days., Disp: 14 tablet, Rfl: 0 .  naproxen (NAPROSYN) 500 MG tablet, Take 1 tablet (500 mg total) by mouth 2 (two) times daily as needed. (Patient not taking: Reported on 08/05/2020), Disp: 30 tablet, Rfl: 2    Assessment & Plan:  1. Breast abscesses :  We will much improved secondary to antibiotics patient needs to get a mammogram ordered for screening but change to diagnostic secondary to left breast pain.  2.  Left upper quadrant abdominal pain much improved since last visit.  Patient has been taking medications for such.  We will add famotidine for possible acid reflux. Discussed with radiology MD at length no new images of abdomen required given that she already has had a CT stone protocol done recently. She will need to go to the ER if pain recurs or worsens. Pain most likely secondary to weight from enlarged breasts urologist review of last CT scans performed in the ER.   3.  Hypertension stable on current medications including HCTZ, Norvasc, losartan HTN :  Continue current meds.  Medication compliance emphasised. pt advised to keep Bp logs. Pt verbalised understanding of the same. Pt to have a low salt diet . Exercise to reach a goal of at least 150 mins a week.  lifestyle modifications explained  and pt understands importance of the above.   4. Hypothyroidism will check US  Thyroid  Consider starting pt on synthroid depending on this / Ft4 / Ft3 levels.   5. SOBOE / Pedal edema.  Will need to check ECHO ASAP Consider cardiology referral.   Problem List Items Addressed This Visit   None      Follow up plan: No follow-ups on file. Pneumonia vaccine today Orders Placed This Encounter  Procedures  . US THYROID    Standing Status:   Future    Standing Expiration Date:    10/05/2020    Order Specific Question:   Reason for Exam (SYMPTOM  OR DIAGNOSIS REQUIRED)    Answer:   elevated tsh    Order Specific Question:   Preferred imaging location?    Answer:   [facility]  . MM DIAG BREAST TOMO BILATERAL    Standing Status:   Future    Standing Expiration Date:   08/05/2021    Order Specific Question:   Reason for Exam (SYMPTOM  OR DIAGNOSIS REQUIRED)    Answer:   left breast pain    Order Specific Question:   Is the patient pregnant?    Answer:   No    Order Specific Question:   Preferred imaging location?    Answer:   [facility]  . US BREAST LTD UNI LEFT INC AXILLA    Standing Status:   Future    Standing Expiration Date:   08/05/2021    Order Specific Question:   Reason for Exam (SYMPTOM  OR DIAGNOSIS REQUIRED)    Answer:   left breast pain    Order Specific Question:   Preferred imaging location?    Answer:   [facility]  . US BREAST LTD UNI RIGHT INC AXILLA    Standing Status:   Future    Standing Expiration Date:   08/05/2021    Order Specific Question:   Reason for Exam (SYMPTOM  OR DIAGNOSIS REQUIRED)    Answer:   left breast pain    Order Specific Question:   Preferred imaging location?    Answer:   [facility]  . Pneumococcal polysaccharide vaccine 23-valent greater than or equal to 2yo subcutaneous/IM  . T4, free  . TSH  . AMB Referral to [facility] Coordinaton    Referral Priority:   Routine    Referral Type:   Consultation    Referral Reason:   Care Coordination    Number of Visits Requested:   1  . ECHOCARDIOGRAM COMPLETE    Standing Status:   Future    Standing Expiration Date:   09/04/2020    Order Specific Question:   Where should this test be performed    Answer:   [facility]    Order Specific Question:   Perflutren DEFINITY (image enhancing agent) should be administered unless hypersensitivity or allergy exist    Answer:   Administer Perflutren    Order Specific Question:   Reason for exam-Echo     Answer:   Dyspnea  R06.00

## 2020-08-06 ENCOUNTER — Other Ambulatory Visit: Payer: Self-pay

## 2020-08-06 ENCOUNTER — Telehealth: Payer: Self-pay

## 2020-08-06 ENCOUNTER — Ambulatory Visit: Payer: 59 | Admitting: Internal Medicine

## 2020-08-06 LAB — T4, FREE: Free T4: 1.2 ng/dL (ref 0.82–1.77)

## 2020-08-06 LAB — TSH: TSH: 5.48 u[IU]/mL — ABNORMAL HIGH (ref 0.450–4.500)

## 2020-08-06 NOTE — Telephone Encounter (Signed)
Returned patient's call, informed her that Dr. Charlotta Newton highly recommends that she go to the ER if she is still in this much pain and discomfort. A referral was sent in to [facility], and 7 days of tramadol was filled.

## 2020-08-06 NOTE — Telephone Encounter (Signed)
Pt called back in regards to missed call. No documentation at the time of who called . Was able to speak with CMA Tammy who had called pt to advise to go to ER if still in Pain, Pt was advised to go to ER by [facility] agent who relayed message of going to er

## 2020-08-06 NOTE — Chronic Care Management (AMB) (Signed)
  Care Management   Note  08/06/2020 Name: Jodi Keith MRN: [id number] DOB: 1965/09/11  Jodi Keith is a 55 y.o. year old female who is a primary care patient of Vigg, Avanti, MD. I reached out to Tobey Bride by phone today in response to a referral sent by [name]'s health plan.    [name] was given information about care management services today including:  1. Care management services include personalized support from designated clinical staff supervised by her physician, including individualized plan of care and coordination with other care providers 2. 24/7 contact phone numbers for assistance for urgent and routine care needs. 3. The patient may stop care management services at any time by phone call to the office staff.  Patient agreed to services and verbal consent obtained.   Follow up plan: Telephone appointment with care management team member scheduled for:08/26/2020  Penne Lash, RMA Care Guide, Embedded Care Coordination [facility]  [place], [place] [address] Direct Dial: [phone] [email or web address]

## 2020-08-07 ENCOUNTER — Inpatient Hospital Stay
Admission: RE | Admit: 2020-08-07 | Discharge: 2020-08-07 | Disposition: A | Discharge status: Home / Self Care | Payer: Self-pay | Source: Ambulatory Visit | Attending: *Deleted | Admitting: *Deleted

## 2020-08-07 ENCOUNTER — Other Ambulatory Visit: Payer: Self-pay | Admitting: *Deleted

## 2020-08-07 DIAGNOSIS — Z1231 Encounter for screening mammogram for malignant neoplasm of breast: Secondary | ICD-10-CM

## 2020-08-07 LAB — CYTOLOGY - PAP
Comment: NEGATIVE
Diagnosis: NEGATIVE
Diagnosis: REACTIVE
High risk HPV: NEGATIVE

## 2020-08-10 ENCOUNTER — Encounter: Payer: Self-pay | Admitting: Internal Medicine

## 2020-08-11 ENCOUNTER — Ambulatory Visit
Admission: RE | Admit: 2020-08-11 | Discharge: 2020-08-11 | Disposition: A | Discharge status: Home / Self Care | Payer: 59 | Source: Ambulatory Visit | Attending: Internal Medicine | Admitting: Internal Medicine

## 2020-08-11 ENCOUNTER — Telehealth: Payer: Self-pay | Admitting: Internal Medicine

## 2020-08-11 ENCOUNTER — Other Ambulatory Visit: Payer: Self-pay

## 2020-08-11 DIAGNOSIS — Z1239 Encounter for other screening for malignant neoplasm of breast: Secondary | ICD-10-CM | POA: Diagnosis not present

## 2020-08-11 NOTE — Telephone Encounter (Signed)
Pt is calling regarding the below message.  Pt has stopped all Medication except my blood pressure meds. Thursday night April 21st she  started having cramps from my head to my toes non stop. After I stopped taking meds she started feeling better with very little cramps on Saturday. And her mammogram tuned out good. Cb- [phone]

## 2020-08-12 ENCOUNTER — Ambulatory Visit: Payer: 59 | Admitting: Podiatry

## 2020-08-12 ENCOUNTER — Ambulatory Visit: Payer: 59

## 2020-08-12 DIAGNOSIS — B070 Plantar wart: Secondary | ICD-10-CM

## 2020-08-12 NOTE — Progress Notes (Signed)
Please let pt know this was normal.

## 2020-08-12 NOTE — Progress Notes (Signed)
   Subjective: 55 y.o. 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  They are very sensitive and painful to walk on.  They have been present for few months now.  Her husband has been shaving them down with minimal improvement.    Past Medical History:  Diagnosis Date  . Arthritis   . Asthma   . CHF (congestive heart failure) (HCC)   . Hypertension     Objective: Physical Exam General: The patient is alert and oriented x3 in no acute distress.   Dermatology: Hyperkeratotic skin lesions noted to the plantar aspect of the bilateral feet approximately 1 cm in diameter. Pinpoint bleeding noted upon debridement. Skin is warm, dry and supple bilateral lower extremities. Negative for open lesions or macerations.   Vascular: Palpable pedal pulses bilaterally. No edema or erythema noted. Capillary refill within normal limits.   Neurological: Epicritic and protective threshold grossly intact bilaterally.    Musculoskeletal Exam: Pain on palpation to the noted skin lesions.  Range of motion within normal limits to all pedal and ankle joints bilateral. Muscle strength 5/5 in all groups bilateral.    Assessment: 1. plantar wart bilateral feet    Plan of Care:  1. Patient was evaluated. 2. Excisional debridement of the plantar wart lesion(s) was performed using a chisel blade.  Salicylic acid was applied and the lesion(s) was dressed with a dry sterile dressing. 3.  Recommend OTC salicylic acid daily 4.  Offloading felt dancers pads were applied to the insoles of the shoes to offload pressure from the area 5.  Return to clinic in 4 weeks  Felecia Shelling, DPM [facility]  Dr. Felecia Shelling, DPM    [address] [place], [place] [address]                Office [phone]  Fax [phone]

## 2020-08-15 ENCOUNTER — Ambulatory Visit
Admission: RE | Admit: 2020-08-15 | Discharge: 2020-08-15 | Disposition: A | Discharge status: Home / Self Care | Payer: 59 | Source: Ambulatory Visit | Attending: Internal Medicine | Admitting: Internal Medicine

## 2020-08-15 ENCOUNTER — Other Ambulatory Visit: Payer: Self-pay

## 2020-08-15 DIAGNOSIS — R0600 Dyspnea, unspecified: Secondary | ICD-10-CM | POA: Insufficient documentation

## 2020-08-15 DIAGNOSIS — I110 Hypertensive heart disease with heart failure: Secondary | ICD-10-CM | POA: Diagnosis not present

## 2020-08-15 DIAGNOSIS — R609 Edema, unspecified: Secondary | ICD-10-CM | POA: Insufficient documentation

## 2020-08-15 DIAGNOSIS — I509 Heart failure, unspecified: Secondary | ICD-10-CM | POA: Diagnosis not present

## 2020-08-15 LAB — ECHOCARDIOGRAM COMPLETE
AR max vel: 2.42 cm2
AV Area VTI: 3.04 cm2
AV Area mean vel: 2.64 cm2
AV Mean grad: 4 mmHg
AV Peak grad: 6.7 mmHg
Ao pk vel: 1.29 m/s
Area-P 1/2: 5.58 cm2
S' Lateral: 2.36 cm

## 2020-08-15 NOTE — Progress Notes (Signed)
*  PRELIMINARY RESULTS* Echocardiogram 2D Echocardiogram has been performed.  Cristela Blue 08/15/2020, 10:39 AM

## 2020-08-19 ENCOUNTER — Other Ambulatory Visit: Payer: Self-pay | Admitting: Internal Medicine

## 2020-08-19 NOTE — Progress Notes (Signed)
D/w pt, to fu with cards next week and scheudled for a stress test sec to abnl echo. To have an US thyroid this week as well , fu with me in 2 weeks with results of the above imaging/ labs. Ok for virtual  per pts request as she has mulitple appointments / imaging

## 2020-08-21 ENCOUNTER — Ambulatory Visit: Payer: 59 | Admitting: Internal Medicine

## 2020-08-22 ENCOUNTER — Telehealth: Payer: Self-pay

## 2020-08-22 NOTE — Telephone Encounter (Signed)
Copied from CRM [id number]. Topic: General - Other >> Aug 22, 2020 10:58 AM Gaetana Michaelis A wrote: Reason for CRM: Patient has made contact to request a prescription for an albuterol inhaler  Patient has been prescribed the inhaler previously by another physician and would like another prescription  Patient shares that they experience discomfort being in the heat for extended amounts of time  Patient shared that aside from extended time outdoors in heat, they're experiencing no respiratory issues or discomfort  Patient declined to make an appointment with their PCP to discuss further because they're scheduled to be seen virtually on 09/09/20   Please contact to further advise when possible   Would pt need sooner apt for refill?

## 2020-08-22 NOTE — Telephone Encounter (Signed)
Please advise 

## 2020-08-25 NOTE — Telephone Encounter (Signed)
This prescription is not in the patient's chart. What would the quantity and refill numbers be?

## 2020-08-26 ENCOUNTER — Ambulatory Visit: Payer: Self-pay | Admitting: General Practice

## 2020-08-26 ENCOUNTER — Telehealth: Payer: 59 | Admitting: General Practice

## 2020-08-26 ENCOUNTER — Telehealth: Payer: Self-pay | Admitting: General Practice

## 2020-08-26 ENCOUNTER — Other Ambulatory Visit: Payer: Self-pay

## 2020-08-26 ENCOUNTER — Ambulatory Visit
Admission: RE | Admit: 2020-08-26 | Discharge: 2020-08-26 | Disposition: A | Discharge status: Home / Self Care | Payer: 59 | Source: Ambulatory Visit | Attending: Internal Medicine | Admitting: Internal Medicine

## 2020-08-26 DIAGNOSIS — R609 Edema, unspecified: Secondary | ICD-10-CM

## 2020-08-26 DIAGNOSIS — J45909 Unspecified asthma, uncomplicated: Secondary | ICD-10-CM

## 2020-08-26 DIAGNOSIS — I509 Heart failure, unspecified: Secondary | ICD-10-CM | POA: Diagnosis present

## 2020-08-26 DIAGNOSIS — G8929 Other chronic pain: Secondary | ICD-10-CM

## 2020-08-26 DIAGNOSIS — W19XXXA Unspecified fall, initial encounter: Secondary | ICD-10-CM

## 2020-08-26 DIAGNOSIS — I10 Essential (primary) hypertension: Secondary | ICD-10-CM

## 2020-08-26 NOTE — Patient Instructions (Signed)
Visit Information  PATIENT GOALS:  Goals Addressed            This Visit's Progress   . RNCM: Manage Chronic Pain       Timeframe:  Short-Term Goal Priority:  High Start Date:              08-26-2020               Expected End Date:   12-16-2020                    Follow Up Date 10-08-2020   - develop a personal pain management plan - plan exercise or activity when pain is best controlled - prioritize tasks for the day - track times pain is worst and when it is best - track what makes the pain worse and what makes it better - use ice or heat for pain relief - work slower and less intense when having pain    Why is this important?    Day-to-day life can be hard when you have chronic pain.   Pain medicine is just one piece of the treatment puzzle.   You can try these action steps to help you manage your pain.          Patient Care Plan: RNCM: Fall Risk (Adult)    Problem Identified: RNCM: Fall Risk   Priority: High    Goal: RNCM: Absence of Fall and Fall-Related Injury   Priority: High  Note:    Current Barriers:  Marland Kitchen Knowledge Deficits related to fall precautions in patient with chronic pain and weakness in legs, causing falls at times  . Decreased adherence to prescribed treatment for fall prevention . Unable to independently manage safety in the home as evidence of fall over the past weekend due to leg weakness and giving away . Does not contact provider office for questions/concerns . Knowledge Deficits related to fall prevention and safety  . Chronic Disease Management support and education needs related to preventing falls in a patient with multiple chronic conditions with recent fall without injury  Clinical Goal(s):  . patient will demonstrate improved adherence to prescribed treatment plan for decreasing falls as evidenced by patient reporting and review of EMR . patient will verbalize using fall risk reduction strategies discussed . patient will not  experience additional falls . patient will verbalize understanding of plan for effective management of falls prevention and safety . patient will work with RNCM, pcp, and CCM team  to address needs related to falls and safety in the patients environment  . patient will attend all scheduled medical appointments: 09-09-2020 at 340 pm . patient will demonstrate improved adherence to prescribed treatment plan for falls prevention and safety  as evidenced byno new falls, being safe in her environment and working with the CCM team and pcp to optimize health and well being.  Interventions:  . Collaboration with Charlynne Cousins, MD regarding development and update of comprehensive plan of care as evidenced by provider attestation and co-signature . Inter-disciplinary care team collaboration (see longitudinal plan of care) . Provided written and verbal education re: Potential causes of falls and Fall prevention strategies . Reviewed medications and discussed potential side effects of medications such as dizziness and frequent urination . Assessed for s/s of orthostatic hypotension . Assessed for falls since last encounter. Fall over the weekend of May 7th . Assessed patients knowledge of fall risk prevention secondary to previously provided education. . Assessed working status of life  alert bracelet and patient adherence . Provided patient information for fall alert systems . Evaluation of current treatment plan related to falls prevention and safety and patient's adherence to plan as established by provider. . Advised patient to call the office for new falls, for concerns, or questions  . Provided education to patient re: being safe in her surroundings and monitoring triggers that cause falls. The patient states that she often falls because her legs give away and cause her to fall. Left leg weakness with last fall over the weekend.  . Reviewed medications with patient and discussed compliance  . Provided  patient with falls prevention and safety educational materials related to preventing falls through the [facility] and my chart system . Reviewed scheduled/upcoming provider appointments including: 09-09-2020 at 340 pm . Discussed plans with patient for ongoing care management follow up and provided patient with direct contact information for care management team Self-Care Deficits:  Unable to independently manage safety as evidence of fall over the weekend, without injury Does not attend all scheduled provider appointments Does not adhere to prescribed medication regimen Does not maintain contact with provider office Does not contact provider office for questions/concerns Patient Goals:  - Utilize safety appropriately with all ambulation- the patient currently does not use any DME for ambulation  - De-clutter walkways - Change positions slowly - Wear secure fitting shoes at all times with ambulation - Utilize home lighting for dim lit areas - Demonstrate self and pet awareness at all times - activities of daily living skills assessed - barriers to physical activity or exercise addressed - barriers to physical activity or exercise identified - barriers to safety identified - cognition assessed - cognitive-stimulating activities promoted - fall prevention plan reviewed and updated - fear of falling, loss of independence and pain acknowledged - medication list reviewed - modification of home and work environment promoted - vision and/or hearing aid use promoted Follow Up Plan: Telephone follow up appointment with care management team member scheduled for: 10-08-2020 at 0900 am   Task: RNCM: Identify and Manage Contributors to Fall Risk   Note:   Care Management Activities:    - activities of daily living skills assessed - barriers to physical activity or exercise addressed - barriers to physical activity or exercise identified - barriers to safety identified - cognition assessed -  cognitive-stimulating activities promoted - fall prevention plan reviewed and updated - fear of falling, loss of independence and pain acknowledged - medication list reviewed - modification of home and work environment promoted - vision and/or hearing aid use promoted       Patient Care Plan: RNCM: Chronic Pain (Adult)- bilateral legs and left side    Problem Identified: RNCM; Pain Management Plan (Chronic Pain) bilateral legs and left side   Priority: High    Goal: RNCM: Pain Management Plan Developed   Priority: High  Note:   Current Barriers:  Marland Kitchen Knowledge Deficits related to managing acute/chronic pain . Non-adherence to scheduled provider appointments . Non-adherence to prescribed medication regimen . Difficulty obtaining medications . Chronic Disease Management support and education needs related to chronic pain . Unable to independently manage pain and discomfort to bilateral legs and left side  . Does not adhere to prescribed medication regimen . Does not contact provider office for questions/concerns Nurse Case Manager Clinical Goal(s):  . patient will verbalize understanding of plan for managing pain . patient will attend all scheduled medical appointments: 09-09-2020 . patient will demonstrate use of different relaxation  skills and/or  diversional activities to assist with pain reduction (distraction, imagery, relaxation, massage, acupressure, TENS, heat, and cold application . patient will report pain at a level less than 3 to 4 on a 10-10 rating scale- reports her pain level at a 7 today . patient will use pharmacological and nonpharmacological pain relief strategies . patient will verbalize acceptable level of pain relief and ability to engage in desired activities . patient will engage in desired activities without an increase in pain level Interventions:  . Collaboration with [name], MD regarding development and update of comprehensive plan of care as evidenced  by provider attestation and co-signature . Inter-disciplinary care team collaboration (see longitudinal plan of care) . - deep breathing, relaxation and mindfulness use promoted . - effectiveness of pharmacologic therapy monitored . - medication-induced side effects managed . - misuse of pain medication assessed . - motivation and barriers to change assessed and addressed . - mutually acceptable comfort goal set . - pain assessed . - pain treatment goals reviewed . Evaluation of current treatment plan related to pain and patient's adherence to plan as established by provider. . Advised patient to call the office for changes in level or intensity of pain and discomfort, take medications as directed and work with the CCM team to optimize a plan of care for effective management of pain and discomfort.  . Provided education to patient re: taking Ibuprofen as prescribed, discussing her pain concerns with the pcp. The patient states she has tried to talk to the pcp about her pain but feels the pcp is not listening. She says she is having everything done but the pain being addressed. Reflective listening with education about finding out the root issues of her  . Reviewed medications with patient and discussed the concern of the patient taking Ibuprofen for pain relief and taking 6 pills at one time multiple times a day. The patient states that is the only relief she can get. Has Naproxen but states that does not help her and makes her stomach hurt. Explained the effects of Ibuprofen on the body systems and the need to take as directed. Will discuss with the pcp and pharm D.  . Collaborated with pcp and pharm D regarding  misuse of Ibuprofen. The patient reports she is taking 6 pills at a time 5 to 6 times a day for her chronic pain and discomfort. Education given.  . Discussed plans with patient for ongoing care management follow up and provided patient with direct contact information for care management  team . Allow patient to maintain a diary of pain ratings, timing, precipitating events, medications, treatments, and what works best to relieve pain,  . Refer to support groups and self-help groups . Educate patient about the use of pharmacological interventions for pain management- antianxiety, antidepressants, NSAIDS, opioid analgesics,  . Explain the importance of lifestyle modifications to effective pain management  Patient Goals/Self Care Activities:  . - mutually acceptable comfort goal set . - pain assessed . - pain management plan developed . - pain treatment goals reviewed . - patient response to treatment assessed . - sharing of pain management plan with teachers and other caregivers encouraged . Self-administers medications as prescribed . Attends all scheduled provider appointments . Calls pharmacy for medication refills . Calls provider office for new concerns or questions Follow Up Plan: Telephone follow up appointment with care management team member scheduled for: 10-08-2020 at 0900 am    Timeframe:  Short-Term Goal Priority:  High Start Date:  08-26-2020               Expected End Date:   12-16-2020                    Follow Up Date 10-08-2020   - develop a personal pain management plan - plan exercise or activity when pain is best controlled - prioritize tasks for the day - track times pain is worst and when it is best - track what makes the pain worse and what makes it better - use ice or heat for pain relief - work slower and less intense when having pain    Why is this important?    Day-to-day life can be hard when you have chronic pain.   Pain medicine is just one piece of the treatment puzzle.   You can try these action steps to help you manage your pain.        Task: RNCM: Partner to Develop Chronic Pain Management Plan   Note:   Care Management Activities:    - mutually acceptable comfort goal set - pain assessed - pain management  plan developed - pain treatment goals reviewed - patient response to treatment assessed - sharing of pain management plan with teachers and other caregivers encouraged       Patient Care Plan: RNCM; Hypertension (Adult)    Problem Identified: RNCM: Hypertension (Hypertension)   Priority: Medium    Long-Range Goal: RNCM; Hypertension Monitored   Priority: Medium  Note:   Objective:  . Last practice recorded BP readings:  BP Readings from Last 3 Encounters:  08/05/20 118/83  08/04/20 126/84  07/30/20 139/85 .   Marland Kitchen Most recent eGFR/CrCl:  Lab Results  Component Value Date   EGFR 64 07/30/2020 .    No components found for: CRCL Current Barriers:  Marland Kitchen Knowledge Deficits related to basic understanding of hypertension pathophysiology and self care management . Knowledge Deficits related to understanding of medications prescribed for management of hypertension . Non-adherence to prescribed medication regimen . Does not adhere to prescribed medication regimen . Does not contact provider office for questions/concerns Case Manager Clinical Goal(s):  . patient will verbalize understanding of plan for hypertension management . patient will attend all scheduled medical appointments: 09-09-2020 at 340 pm . patient will demonstrate improved adherence to prescribed treatment plan for hypertension as evidenced by taking all medications as prescribed, monitoring and recording blood pressure as directed, adhering to low sodium/DASH diet . patient will demonstrate improved health management independence as evidenced by checking blood pressure as directed and notifying PCP if SBP>160 or DBP > 90, taking all medications as prescribe, and adhering to a low sodium diet as discussed. . patient will verbalize basic understanding of hypertension disease process and self health management plan as evidenced by compliance with heart healthy diet, compliance with medications, and working with the CCM team to  manage health and well being Interventions:  . Collaboration with Charlynne Cousins, MD regarding development and update of comprehensive plan of care as evidenced by provider attestation and co-signature . Inter-disciplinary care team collaboration (see longitudinal plan of care) . Evaluation of current treatment plan related to hypertension self management and patient's adherence to plan as established by provider. Is having swelling in her feet and legs. Scheduled for stress test tomorrow for evaluation. Education on elevation of legs. She does not like to wear anything on her legs, not even pants touching them. Review of ways to help with edema in legs and  feet.  . Provided education to patient re: stroke prevention, s/s of heart attack and stroke, DASH diet, complications of uncontrolled blood pressure . Reviewed medications with patient and discussed importance of compliance . Discussed plans with patient for ongoing care management follow up and provided patient with direct contact information for care management team . Advised patient, providing education and rationale, to monitor blood pressure daily and record, calling PCP for findings outside established parameters.  . Reviewed scheduled/upcoming provider appointments including: 09-09-2020 at 340 pm Self-Care Activities: - Self administers medications as prescribed Attends all scheduled provider appointments Calls provider office for new concerns, questions, or BP outside discussed parameters Checks BP and records as discussed Follows a low sodium diet/DASH diet Patient Goals: - check blood pressure weekly - choose a place to take my blood pressure (home, clinic or office, retail store) - write blood pressure results in a log or diary - agree on reward when goals are met - agree to work together to make changes - ask questions to understand - have a family meeting to talk about healthy habits - learn about high blood pressure  - blood  pressure trends reviewed - depression screen reviewed - home or ambulatory blood pressure monitoring encouraged Follow Up Plan: Telephone follow up appointment with care management team member scheduled for: 10-08-2020 at 0900 am   Task: RNCM: Identify and Monitor Blood Pressure Elevation   Note:   Care Management Activities:    - blood pressure trends reviewed - depression screen reviewed - home or ambulatory blood pressure monitoring encouraged       Patient Care Plan: RNCM: Asthma (Adult)    Problem Identified: RNCM: Disease Progression (Asthma)   Priority: Medium    Long-Range Goal: Disease Progression Prevented or Minimized   Priority: Medium  Note:   Current Barriers:  Marland Kitchen Knowledge deficits related to basic understanding of asthma disease process . Knowledge deficits related to basic asthma  self care/management . Knowledge deficit related to importance of energy conservation . Unable to independently manage asthma exacerbations  . Does not maintain contact with provider office . Does not contact provider office for questions/concerns  Case Manager Clinical Goal(s):  patient will report utilizing pursed lip breathing for shortness of breath  patient will verbalize understanding of asthma action plan and when to seek appropriate levels of medical care  patient will engage in lite exercise as tolerated to build/regain stamina and strength and reduce shortness of breath through activity tolerance  patient will verbalize basic understanding of asthma disease process and self care activities  Interventions:  . Collaboration with Charlynne Cousins, MD regarding development and update of comprehensive plan of care as evidenced by provider attestation and co-signature . Inter-disciplinary care team collaboration (see longitudinal plan of care)  UNABLE to independently: manage asthma during periods of exacerbation   Provided patient with basic written and verbal asthma education on  self care/management/and exacerbation prevention. The patient states that she needs a refill for her albuterol as the one she has is outdated. She states she has had asthma for a long time that started in her childhood. She only uses her inhaler when she needs it. Will touch base with pcp and collaborate with patients expressed needs. Explained to the patient that she may need to be evaluated by the pcp before an inhaler could be prescribed. Also advised the patient about smoking causing exacerbation. See care plan for smoking cessation.   Provided patient with asthma action plan and reinforced importance of  daily self assessment  Provided instruction about proper use of medications used for management of asthma including inhalers  Advised patient to self assesses asthma  action plan zone and make appointment with provider if in the yellow zone for 48 hours without improvement.  Provided patient with education about the role of exercise in the management of asthma   Advised patient to engage in light exercise as tolerated 3-5 days a week  Provided education about and advised patient to utilize infection prevention strategies to reduce risk of respiratory infection  Self-Care Activities:  Patient verbalizes understanding of plan to effective management of asthma  Self administers medications as prescribed Attends all scheduled provider appointments Calls pharmacy for medication refills Attends church or other social activities Performs ADL's independently Performs IADL's independently Calls provider office for new concerns or questions Patient Goals: - do breathing exercises every day - do breathing exercises at least 2 times each day - do exercises in a comfortable position that makes breathing as easy as possible - develop a new routine to improve sleep - don't eat or exercise right before bedtime - eat healthy - get at least 7 to 8 hours of sleep at night - get outdoors every day  (weather permitting) - keep room cool and dark - limit daytime naps - practice relaxation or meditation daily - use a fan or white noise in bedroom - use devices that will help like a cane, sock-puller or reacher - begin a symptom diary - bring symptom diary to all visits - develop a rescue plan - eliminate symptom triggers at home - follow rescue plan if symptoms flare-up - keep follow-up appointments - use an extra pillow to sleep - avoid second hand smoke - eliminate smoking in my home - identify and avoid work-related triggers - identify and remove indoor air pollutants - limit outdoor activity during cold weather - listen for public air quality announcements every day - activity or exercise based on tolerance encouraged - barriers to medication adherence identified - emotional support provided - medication-adherence assessment completed - screen for functional limitations reviewed - side effects of medication monitored - self-awareness of symptom triggers encouraged - symptom control monitored - symptom log or asthma action plan reviewed - symptom triggers identified Follow Up Plan: Telephone follow up appointment with care management team member scheduled for: 10-08-2020 at 0900 am   Task: RNCM: Monitor and Manage Adherence to Asthma Therapy   Note:   Care Management Activities:    - activity or exercise based on tolerance encouraged - barriers to medication adherence identified - emotional support provided - medication-adherence assessment completed - screen for functional limitations reviewed - side effects of medication monitored - self-awareness of symptom triggers encouraged - symptom control monitored - symptom log or asthma action plan reviewed - symptom triggers identified       Problem Identified: RNCM: Smoking cessation   Priority: High    Long-Range Goal: RNCM: Smoking Cessation   Priority: High  Note:   Current Barriers:  . Unable to  independently stop smoking.  Leodis [place] social connections . Does not contact provider office for questions/concerns . Tobacco abuse of >30 years; currently smoking 1 ppd . Previous quit attempts, unsuccessful several successful using 3 times while she was pregnant and quit because she did not want to harm the baby  . Reports smoking within 30 minutes of waking up . Reports triggers to smoke include: anxiety, not having the answers to her health problems . Reports motivation  to quit smoking includes: knows it would be better for her health and well being  . On a scale of 1-10, reports MOTIVATION to quit is 7 . On a scale of 1-10, reports CONFIDENCE in quitting is 7 Clinical Goal(s):  . patient will work with Chief Operating Officer and provider towards tobacco cessation  Interventions: . Collaboration with Charlynne Cousins, MD regarding development and update of comprehensive plan of care as evidenced by provider attestation and co-signature . Inter-disciplinary care team collaboration (see longitudinal plan of care) . Evaluation of current treatment plan reviewed- the patient wants to quit but states she smokes more when her anxiety level is up. Is interested in medications to help with quitting but states she can not afford. Pharm D referral.  . Provided contact information for [place] [facility] (1-800-QUIT-NOW). Patient will outreach this group for support. . Discussed plans with patient for ongoing care management follow up and provided patient with direct contact information for care management team . Provided patient with printed smoking cessation educational materials . Reviewed scheduled/upcoming provider appointments including: 09-09-2020 . Referred patient to pharmacy team . Provided contact information for [place] [facility] (1-800-QUIT-NOW). Patient will outreach this group for support. . Evaluation of current treatment plan reviewed Patient Goals/Self-Care  Activities - diversion activities as a habit during cravings  - verbally commit to reducing tobacco consumption - adherence to treatment plan encouraged - barriers to adherence to treatment plan identified - lifestyle changes encouraged - psychosocial concerns monitored - symptoms of respiratory distress monitored  Follow Up Plan: Telephone follow up appointment with care management team member scheduled for: 10-08-2020 at 0900 am   Task: RNCM: Smoking cessation   Note:   Care Management Activities:    - adherence to treatment plan encouraged - barriers to adherence to treatment plan identified - lifestyle changes encouraged - psychosocial concerns monitored - symptoms of respiratory distress monitored          Ms. Heimann was given information about Care Management services by the embedded care coordination team including:  1. Care Management services include personalized support from designated clinical staff supervised by her physician, including individualized plan of care and coordination with other care providers 2. 24/7 contact phone numbers for assistance for urgent and routine care needs. 3. The patient may stop CCM services at any time (effective at the end of the month) by phone call to the office staff.  Patient agreed to services and verbal consent obtained.   Patient verbalizes understanding of instructions provided today and agrees to view in [place].   Telephone follow up appointment with care management team member scheduled for: 10-08-2020 at 0900 am  Noreene Larsson RN, MSN, [facility] Mobile: [phone]    Steps to Quit Smoking Smoking tobacco is the leading cause of preventable death. It can affect almost every organ in the body. Smoking puts you and people around you at risk for many serious, long-lasting (chronic) diseases. Quitting smoking can be hard, but it is one of the  best things that you can do for your health. It is never too late to quit. How do I get ready to quit? When you decide to quit smoking, make a plan to help you succeed. Before you quit:  Pick a date to quit. Set a date within the next 2 weeks to give you time to prepare.  Write down the reasons why you are  quitting. Keep this list in places where you will see it often.  Tell your family, friends, and co-workers that you are quitting. Their support is important.  Talk with your doctor about the choices that may help you quit.  Find out if your health insurance will pay for these treatments.  Know the people, places, things, and activities that make you want to smoke (triggers). Avoid them. What first steps can I take to quit smoking?  Throw away all cigarettes at home, at work, and in your car.  Throw away the things that you use when you smoke, such as ashtrays and lighters.  Clean your car. Make sure to empty the ashtray.  Clean your home, including curtains and carpets. What can I do to help me quit smoking? Talk with your doctor about taking medicines and seeing a counselor at the same time. You are more likely to succeed when you do both.  If you are pregnant or breastfeeding, talk with your doctor about counseling or other ways to quit smoking. Do not take medicine to help you quit smoking unless your doctor tells you to do so. To quit smoking: Quit right away  Quit smoking totally, instead of slowly cutting back on how much you smoke over a period of time.  Go to counseling. You are more likely to quit if you go to counseling sessions regularly. Take medicine You may take medicines to help you quit. Some medicines need a prescription, and some you can buy over-the-counter. Some medicines may contain a drug called nicotine to replace the nicotine in cigarettes. Medicines may:  Help you to stop having the desire to smoke (cravings).  Help to stop the problems that come when  you stop smoking (withdrawal symptoms). Your doctor may ask you to use:  Nicotine patches, gum, or lozenges.  Nicotine inhalers or sprays.  Non-nicotine medicine that is taken by mouth. Find resources Find resources and other ways to help you quit smoking and remain smoke-free after you quit. These resources are most helpful when you use them often. They include:  Online chats with a Social worker.  Phone quitlines.  Printed Furniture conservator/restorer.  Support groups or group counseling.  Text messaging programs.  Mobile phone apps. Use apps on your mobile phone or tablet that can help you stick to your quit plan. There are many free apps for mobile phones and tablets as well as websites. Examples include Quit Guide from the [employer] and smokefree.gov   What things can I do to make it easier to quit?  Talk to your family and friends. Ask them to support and encourage you.  Call a phone quitline (1-800-QUIT-NOW), reach out to support groups, or work with a Social worker.  Ask people who smoke to not smoke around you.  Avoid places that make you want to smoke, such as: ? Bars. ? Parties. ? Smoke-break areas at work.  Spend time with people who do not smoke.  Lower the stress in your life. Stress can make you want to smoke. Try these things to help your stress: ? Getting regular exercise. ? Doing deep-breathing exercises. ? Doing yoga. ? Meditating. ? Doing a body scan. To do this, close your eyes, focus on one area of your body at a time from head to toe. Notice which parts of your body are tense. Try to relax the muscles in those areas.   How will I feel when I quit smoking? Day 1 to 3 weeks Within the first 24 hours,  you may start to have some problems that come from quitting tobacco. These problems are very bad 2-3 days after you quit, but they do not often last for more than 2-3 weeks. You may get these symptoms:  Mood swings.  Feeling restless, nervous, angry, or annoyed.  Trouble  concentrating.  Dizziness.  Strong desire for high-sugar foods and nicotine.  Weight gain.  Trouble pooping (constipation).  Feeling like you may vomit (nausea).  Coughing or a sore throat.  Changes in how the medicines that you take for other issues work in your body.  Depression.  Trouble sleeping (insomnia). Week 3 and afterward After the first 2-3 weeks of quitting, you may start to notice more positive results, such as:  Better sense of smell and taste.  Less coughing and sore throat.  Slower heart rate.  Lower blood pressure.  Clearer skin.  Better breathing.  Fewer sick days. Quitting smoking can be hard. Do not give up if you fail the first time. Some people need to try a few times before they succeed. Do your best to stick to your quit plan, and talk with your doctor if you have any questions or concerns. Summary  Smoking tobacco is the leading cause of preventable death. Quitting smoking can be hard, but it is one of the best things that you can do for your health.  When you decide to quit smoking, make a plan to help you succeed.  Quit smoking right away, not slowly over a period of time.  When you start quitting, seek help from your doctor, family, or friends. This information is not intended to replace advice given to you by your health care provider. Make sure you discuss any questions you have with your health care provider. Document Revised: 12/29/2018 Document Reviewed: 06/24/2018 Elsevier Patient Education  [place].  [email or web address]">  DASH Eating Plan DASH stands for Dietary Approaches to Stop Hypertension. The DASH eating plan is a healthy eating plan that has been shown to:  Reduce high blood pressure (hypertension).  Reduce your risk for type 2 diabetes, heart disease, and stroke.  Help with weight loss. What are tips for following this plan? Reading food labels  Check food  labels for the amount of salt (sodium) per serving. Choose foods with less than 5 percent of the Daily Value of sodium. Generally, foods with less than 300 milligrams (mg) of sodium per serving fit into this eating plan.  To find whole grains, look for the word "whole" as the first word in the ingredient list. Shopping  Buy products labeled as "low-sodium" or "no salt added."  Buy fresh foods. Avoid canned foods and pre-made or frozen meals. Cooking  Avoid adding salt when cooking. Use salt-free seasonings or herbs instead of table salt or sea salt. Check with your health care provider or pharmacist before using salt substitutes.  Do not fry foods. Cook foods using healthy methods such as baking, boiling, grilling, roasting, and broiling instead.  Cook with heart-healthy oils, such as olive, canola, avocado, soybean, or sunflower oil. Meal planning  Eat a balanced diet that includes: ? 4 or more servings of fruits and 4 or more servings of vegetables each day. Try to fill one-half of your plate with fruits and vegetables. ? 6-8 servings of whole grains each day. ? Less than 6 oz (170 g) of lean meat, poultry, or fish each day. A 3-oz (85-g) serving of meat is about the same size as a deck of cards. One  egg equals 1 oz (28 g). ? 2-3 servings of low-fat dairy each day. One serving is 1 cup (237 mL). ? 1 serving of nuts, seeds, or beans 5 times each week. ? 2-3 servings of heart-healthy fats. Healthy fats called omega-3 fatty acids are found in foods such as walnuts, flaxseeds, fortified milks, and eggs. These fats are also found in cold-water fish, such as sardines, salmon, and mackerel.  Limit how much you eat of: ? Canned or prepackaged foods. ? Food that is high in trans fat, such as some fried foods. ? Food that is high in saturated fat, such as fatty meat. ? Desserts and other sweets, sugary drinks, and other foods with added sugar. ? Full-fat dairy products.  Do not salt foods  before eating.  Do not eat more than 4 egg yolks a week.  Try to eat at least 2 vegetarian meals a week.  Eat more home-cooked food and less restaurant, buffet, and fast food.   Lifestyle  When eating at a restaurant, ask that your food be prepared with less salt or no salt, if possible.  If you drink alcohol: ? Limit how much you use to:  0-1 drink a day for women who are not pregnant.  0-2 drinks a day for men. ? Be aware of how much alcohol is in your drink. In the [place] one drink equals one 12 oz bottle of beer (355 mL), one 5 oz glass of wine (148 mL), or one 1 oz glass of hard liquor (44 mL). General information  Avoid eating more than 2,300 mg of salt a day. If you have hypertension, you may need to reduce your sodium intake to 1,500 mg a day.  Work with your health care provider to maintain a healthy body weight or to lose weight. Ask what an ideal weight is for you.  Get at least 30 minutes of exercise that causes your heart to beat faster (aerobic exercise) most days of the week. Activities may include walking, swimming, or biking.  Work with your health care provider or dietitian to adjust your eating plan to your individual calorie needs. What foods should I eat? Fruits All fresh, dried, or frozen fruit. Canned fruit in natural juice (without added sugar). Vegetables Fresh or frozen vegetables (raw, steamed, roasted, or grilled). Low-sodium or reduced-sodium tomato and vegetable juice. Low-sodium or reduced-sodium tomato sauce and tomato paste. Low-sodium or reduced-sodium canned vegetables. Grains Whole-grain or whole-wheat bread. Whole-grain or whole-wheat pasta. Brown rice. [place]. Bulgur. Whole-grain and low-sodium cereals. Pita bread. Low-fat, low-sodium crackers. Whole-wheat flour tortillas. Meats and other proteins Skinless chicken or [place]. Ground chicken or [place]. Pork with fat trimmed off. Fish and seafood. Egg whites. Dried beans, peas, or  lentils. Unsalted nuts, nut butters, and seeds. Unsalted canned beans. Lean cuts of beef with fat trimmed off. Low-sodium, lean precooked or cured meat, such as sausages or meat loaves. Dairy Low-fat (1%) or fat-free (skim) milk. Reduced-fat, low-fat, or fat-free cheeses. Nonfat, low-sodium ricotta or cottage cheese. Low-fat or nonfat yogurt. Low-fat, low-sodium cheese. Fats and oils Soft margarine without trans fats. Vegetable oil. Reduced-fat, low-fat, or light mayonnaise and salad dressings (reduced-sodium). Canola, safflower, olive, avocado, soybean, and sunflower oils. Avocado. Seasonings and condiments Herbs. Spices. Seasoning mixes without salt. Other foods Unsalted popcorn and pretzels. Fat-free sweets. The items listed above may not be a complete list of foods and beverages you can eat. Contact a dietitian for more information. What foods should I avoid? Fruits Canned fruit  in a light or heavy syrup. Fried fruit. Fruit in cream or butter sauce. Vegetables Creamed or fried vegetables. Vegetables in a cheese sauce. Regular canned vegetables (not low-sodium or reduced-sodium). Regular canned tomato sauce and paste (not low-sodium or reduced-sodium). Regular tomato and vegetable juice (not low-sodium or reduced-sodium). Angie Fava. Olives. Grains Baked goods made with fat, such as croissants, muffins, or some breads. Dry pasta or rice meal packs. Meats and other proteins Fatty cuts of meat. Ribs. Fried meat. Berniece Salines. Bologna, salami, and other precooked or cured meats, such as sausages or meat loaves. Fat from the back of a pig (fatback). Bratwurst. Salted nuts and seeds. Canned beans with added salt. Canned or smoked fish. Whole eggs or egg yolks. Chicken or [place] with skin. Dairy Whole or 2% milk, cream, and half-and-half. Whole or full-fat cream cheese. Whole-fat or sweetened yogurt. Full-fat cheese. Nondairy creamers. Whipped toppings. Processed cheese and cheese spreads. Fats and  oils Butter. Stick margarine. Lard. Shortening. Ghee. Bacon fat. Tropical oils, such as coconut, palm kernel, or palm oil. Seasonings and condiments Onion salt, garlic salt, seasoned salt, table salt, and sea salt. Worcestershire sauce. Tartar sauce. Barbecue sauce. Teriyaki sauce. Soy sauce, including reduced-sodium. Steak sauce. Canned and packaged gravies. Fish sauce. Oyster sauce. Cocktail sauce. Store-bought horseradish. Ketchup. Mustard. Meat flavorings and tenderizers. Bouillon cubes. Hot sauces. Pre-made or packaged marinades. Pre-made or packaged taco seasonings. Relishes. Regular salad dressings. Other foods Salted popcorn and pretzels. The items listed above may not be a complete list of foods and beverages you should avoid. Contact a dietitian for more information. Where to find more information  National Heart, Lung, and Blood Institute: https://wilson-eaton.com/  American Heart Association: www.heart.org  Academy of Nutrition and Dietetics: www.eatright.[place]: www.kidney.org Summary  The DASH eating plan is a healthy eating plan that has been shown to reduce high blood pressure (hypertension). It may also reduce your risk for type 2 diabetes, heart disease, and stroke.  When on the DASH eating plan, aim to eat more fresh fruits and vegetables, whole grains, lean proteins, low-fat dairy, and heart-healthy fats.  With the DASH eating plan, you should limit salt (sodium) intake to 2,300 mg a day. If you have hypertension, you may need to reduce your sodium intake to 1,500 mg a day.  Work with your health care provider or dietitian to adjust your eating plan to your individual calorie needs. This information is not intended to replace advice given to you by your health care provider. Make sure you discuss any questions you have with your health care provider. Document Revised: 03/09/2019 Document Reviewed: 03/09/2019 Elsevier Patient Education  2021 [place].  Pain Medicine Instructions You may need pain medicine after an injury or illness. Two common types of pain medicine are:  Opioid pain medicine. These may be called opioids.  Non-opioid pain medicine. This includes NSAIDs. It is important to follow your doctor's instructions when you are taking pain medicine. Doing this can keep yourself and others safe. How can pain medicine affect me? Pain medicine may not make all of your pain go away. It should make you comfortable enough to:  Move.  Breathe.  Do normal activities. Opioids can cause side effects, such as:  Trouble pooping (constipation).  Feeling sick to your stomach (nausea).  Throwing up (vomiting).  Feeling very sleepy.  Confusion.  Taking the medicine for nonmedical reasons even though taking it hurts your health and well-being (opioid use disorder).  Trouble breathing (respiratory depression). Taking  opioids for longer than 3 days raises your risk of these side effects. Taking opioids for a long time can affect how well you can do daily tasks. Taking them for a long time also puts you at risk for:  Car crashes.  Depression.  Suicide.  Heart attack.  Taking too much of the medicine (overdose). This can lead to death.   What should I do to stay safe while taking pain medicine? Take your medicine as told  Take pain medicine exactly as told by your doctor. Take it only when you need it.  Write down the times when you take your pain medicine. Look at the times before you take your next dose.  Take other over-the-counter or prescription medicines only as told by your doctor. ? If your pain medicine has acetaminophen in it, do not take any other acetaminophen while you are taking this medicine. Too much can damage the liver.  Get pain medicine prescriptions from only one doctor. Avoid certain activities While you are taking prescription pain medicine, and for 8 hours after your last dose:  Do not  drive.  Do not use machinery.  Do not use power tools.  Do not sign legal documents.  Do not drink alcohol.  Do not take sleeping pills.  Do not take care of children by yourself.  Do not do any activities that involve climbing or being in high places.  Do not go into any body of water unless there is an adult nearby who can watch you and help you if needed. This includes: ? [place], rivers, or oceans. ? Spas or swimming pools.   Keep others safe  Store your medicine as told by your doctor. Keep it where children and pets cannot reach it.  Do not share your pain medicine with anyone.  Do not save any leftover pills. If you have leftover pills, you can: ? Bring them to a take-back program. ? Bring them to a pharmacy that has a drug disposal container. ? Throw them in the trash. Check the medicine label or package insert to see if it is safe to throw it out. If it is safe, take the medicine out of the container. Mix it with something that makes it unusable, such as pet waste. Then put the medicine in the trash. General instructions  Talk with your doctor about other ways to manage your pain.  If you have trouble pooping: ? Drink enough fluid to keep your pee (urine) pale yellow. ? Use a poop (stool) softener as told by your doctor. ? Eat more fruits and vegetables.  Keep all follow-up visits as told by your doctor. This is important. Contact a doctor if:  Your medicine is not helping with your pain.  You have a rash.  You feel depressed. Get help right away if: Seek medical care right away if you are taking pain medicines and you (or people close to you) notice any of the following:  Trouble breathing.  Breathing that is slower than normal.  Breathing that is more shallow than normal.  Confusion.  Sleepiness or trouble staying awake.  Feeling sick to your stomach.  Throwing up.  Your skin or lips turning pale or bluish in color.  Tongue swelling. Get help  right away if you feel like you may hurt yourself or others, or have thoughts about taking your own life. Go to your nearest emergency room or:  Call your local emergency services (911 in the [place]  Call the [facility]  Suicide Prevention Lifeline at 1-800- 6297520816. This is open 24 hours a day. Summary  Take your pain medicine exactly as told by your doctor.  Pain medicine can help lower your pain. It may also cause side effects.  Talk with your doctor about other ways to manage your pain.  Follow your doctor's instructions about how to take your pain medicine and keep others safe. Ask what activities you should avoid while taking pain medicine. This information is not intended to replace advice given to you by your health care provider. Make sure you discuss any questions you have with your health care provider. Document Revised: 01/17/2020 Document Reviewed: 01/17/2020 Elsevier Patient Education  2021 [place] for Falls Each year, millions of people have serious injuries from falls. It is important to understand your risk for falling. Talk with your health care provider about your risk and what you can do to lower it. There are actions you can take at home to lower your risk and prevent falls. If you do have a serious fall, make sure to tell your health care provider. Falling once raises your risk of falling again. How can falls affect me? Serious injuries from falls are common. These include:  Broken bones, such as hip fractures.  Head injuries, such as traumatic brain injuries (TBI) or concussion. A fear of falling can cause you to avoid activities and stay at home. This can make your muscles weaker and actually raise your risk for a fall. What can increase my risk? There are a number of risk factors that increase your risk for falling. The more risk factors you have, the higher your risk of falling. Serious injuries from a fall happen most often to  people older than age 66. Children and young adults ages 46-29 are also at higher risk. Common risk factors include:  Weakness in the lower body.  Lack (deficiency) of vitamin D.  Being generally weak or confused due to long-term (chronic) illness.  Dizziness or balance problems.  Poor vision.  Medicines that cause dizziness or drowsiness. These can include medicines for your blood pressure, heart, anxiety, insomnia, or edema, as well as pain medicines and muscle relaxants. Other risk factors include:  Drinking alcohol.  Having had a fall in the past.  Having depression.  Having foot pain or wearing improper footwear.  Working at a dangerous job.  Having any of the following in your home: ? Tripping hazards, such as floor clutter or loose rugs. ? Poor lighting. ? Pets.  Having dementia or memory loss. What actions can I take to lower my risk of falling? Physical activity Maintain physical fitness. Do strength and balance exercises. Consider taking a regular class to build strength and balance. Yoga and tai chi are good options. Vision Have your eyes checked every year and your vision prescription updated as needed. Walking aids and footwear  Wear nonskid shoes. Do not wear high heels.  Do not walk around the house in socks or slippers.  Use a cane or walker as told by your health care provider. Home safety  Attach secure railings on both sides of your stairs.  Install grab bars for your tub, shower, and toilet. Use a bath mat in your tub or shower.  Use good lighting in all rooms. Keep a flashlight near your bed.  Make sure there is a clear path from your bed to the bathroom. Use night-lights.  Do not use throw rugs. Make sure all carpeting is taped  or tacked down securely.  Remove all clutter from walkways and stairways, including extension cords.  Repair uneven or broken steps.  Avoid walking on icy or slippery surfaces. Walk on the grass instead of on  icy or slick sidewalks. Use ice melt to get rid of ice on walkways.  Use a cordless phone.      Questions to ask your health care provider  Can you help me check my risk for a fall?  Do any of my medicines make me more likely to fall?  Should I take a vitamin D supplement?  What exercises can I do to improve my strength and balance?  Should I make an appointment to have my vision checked?  Do I need a bone density test to check for weak bones or osteoporosis?  Would it help to use a cane or a walker? Where to find more information  Centers for Disease Control and Prevention, [place]AD[place]: http://www.wolf.info/  Community-Based Fall Prevention Programs: http://www.wolf.info/  National Institute on Aging: http://kim-miller.com/ Contact a health care provider if:  You fall at home.  You are afraid of falling at home.  You feel weak, drowsy, or dizzy. Summary  Serious injuries from a fall happen most often to people older than age 2. Children and young adults ages 92-29 are also at higher risk.  Talk with your health care provider about your risks for falling and how to lower those risks.  Taking certain precautions at home can lower your risk for falling.  If you fall, always tell your health care provider. This information is not intended to replace advice given to you by your health care provider. Make sure you discuss any questions you have with your health care provider. Document Revised: 11/07/2019 Document Reviewed: 11/07/2019 Elsevier Patient Education  [place].

## 2020-08-26 NOTE — Chronic Care Management (AMB) (Signed)
Care Management    RN Visit Note  08/26/2020 Name: Jodi Keith MRN: [id number] DOB: 01-30-1966  Subjective: Jodi Keith is a 55 y.o. year old female who is a primary care patient of Vigg, Avanti, MD. The care management team was consulted for assistance with disease management and care coordination needs.    Engaged with patient by telephone for initial visit in response to provider referral for case management and/or care coordination services.   Consent to Services:   Jodi Keith was given information about Care Management services today including:  1. Care Management services includes personalized support from designated clinical staff supervised by her physician, including individualized plan of care and coordination with other care providers 2. 24/7 contact phone numbers for assistance for urgent and routine care needs. 3. The patient may stop case management services at any time by phone call to the office staff.  Patient agreed to services and consent obtained.   Assessment: Review of patient past medical history, allergies, medications, health status, including review of consultants reports, laboratory and other test data, was performed as part of comprehensive evaluation and provision of chronic care management services.   SDOH (Social Determinants of Health) assessments and interventions performed:  SDOH Interventions   Flowsheet Row Most Recent Value  SDOH Interventions   Physical Activity Interventions Other (Comments)  [patient states she wants to exercise but is limited due to pain in her legs]  Stress Interventions Other (Comment)  [the patient is concerned due to all the testing being done]  Social Connections Interventions Other (Comment)  [has a good support system]  Depression Interventions/Treatment  --  [referral to LCSW for help with smoking cessation and anxiety]       Care Plan  Allergies  Allergen Reactions  . Prochlorperazine Edisylate  Anaphylaxis    coma    Outpatient Encounter Medications as of 08/26/2020  Medication Sig Note  . ibuprofen (ADVIL) 200 MG tablet Take 200 mg by mouth every 4 (four) hours as needed (patient states that she takes 6 at a time 5 or 6 times a day). Education- will do pharmacy referral   . naproxen (NAPROSYN) 500 MG tablet Take 1 tablet (500 mg total) by mouth 2 (two) times daily as needed. 08/05/2020: Makes patient sick to her stomach  . amLODipine (NORVASC) 10 MG tablet Take 1 tablet by mouth daily.   . famotidine (PEPCID) 20 MG tablet Take 1 tablet (20 mg total) by mouth at bedtime.   . hydrochlorothiazide (MICROZIDE) 12.5 MG capsule Take 1 capsule (12.5 mg total) by mouth daily.   Marland Kitchen losartan (COZAAR) 50 MG tablet Take 1 tablet (50 mg total) by mouth daily.   Marland Kitchen nystatin cream (MYCOSTATIN) Apply 1 application topically 2 (two) times daily.    No facility-administered encounter medications on file as of 08/26/2020.    Patient Active Problem List   Diagnosis Date Noted  . Hypertensive emergency 05/02/2018    Conditions to be addressed/monitored: HTN and chronic pain, fall prevention and safety, smoking cessation and asthma  Care Plan : RNCM: Fall Risk (Adult)  Updates made by Jodi Keith since 08/26/2020 12:00 AM    Problem: RNCM: Fall Risk   Priority: High    Goal: RNCM: Absence of Fall and Fall-Related Injury   Priority: High  Note:    Current Barriers:  Marland Kitchen Knowledge Deficits related to fall precautions in patient with chronic pain and weakness in legs, causing falls at times  . Decreased adherence to  prescribed treatment for fall prevention . Unable to independently manage safety in the home as evidence of fall over the past weekend due to leg weakness and giving away . Does not contact provider office for questions/concerns . Knowledge Deficits related to fall prevention and safety  . Chronic Disease Management support and education needs related to preventing falls in a patient  with multiple chronic conditions with recent fall without injury  Clinical Goal(s):  . patient will demonstrate improved adherence to prescribed treatment plan for decreasing falls as evidenced by patient reporting and review of EMR . patient will verbalize using fall risk reduction strategies discussed . patient will not experience additional falls . patient will verbalize understanding of plan for effective management of falls prevention and safety . patient will work with RNCM, pcp, and CCM team  to address needs related to falls and safety in the patients environment  . patient will attend all scheduled medical appointments: 09-09-2020 at 340 pm . patient will demonstrate improved adherence to prescribed treatment plan for falls prevention and safety  as evidenced byno new falls, being safe in her environment and working with the CCM team and pcp to optimize health and well being.  Interventions:  . Collaboration with Charlynne Cousins, MD regarding development and update of comprehensive plan of care as evidenced by provider attestation and co-signature . Inter-disciplinary care team collaboration (see longitudinal plan of care) . Provided written and verbal education re: Potential causes of falls and Fall prevention strategies . Reviewed medications and discussed potential side effects of medications such as dizziness and frequent urination . Assessed for s/s of orthostatic hypotension . Assessed for falls since last encounter. Fall over the weekend of May 7th . Assessed patients knowledge of fall risk prevention secondary to previously provided education. . Assessed working status of life alert bracelet and patient adherence . Provided patient information for fall alert systems . Evaluation of current treatment plan related to falls prevention and safety and patient's adherence to plan as established by provider. . Advised patient to call the office for new falls, for concerns, or questions   . Provided education to patient re: being safe in her surroundings and monitoring triggers that cause falls. The patient states that she often falls because her legs give away and cause her to fall. Left leg weakness with last fall over the weekend.  . Reviewed medications with patient and discussed compliance  . Provided patient with falls prevention and safety educational materials related to preventing falls through the [facility] and my chart system . Reviewed scheduled/upcoming provider appointments including: 09-09-2020 at 340 pm . Discussed plans with patient for ongoing care management follow up and provided patient with direct contact information for care management team Self-Care Deficits:  Unable to independently manage safety as evidence of fall over the weekend, without injury Does not attend all scheduled provider appointments Does not adhere to prescribed medication regimen Does not maintain contact with provider office Does not contact provider office for questions/concerns Patient Goals:  - Utilize safety appropriately with all ambulation- the patient currently does not use any DME for ambulation  - De-clutter walkways - Change positions slowly - Wear secure fitting shoes at all times with ambulation - Utilize home lighting for dim lit areas - Demonstrate self and pet awareness at all times - activities of daily living skills assessed - barriers to physical activity or exercise addressed - barriers to physical activity or exercise identified - barriers to safety identified - cognition assessed -  cognitive-stimulating activities promoted - fall prevention plan reviewed and updated - fear of falling, loss of independence and pain acknowledged - medication list reviewed - modification of home and work environment promoted - vision and/or hearing aid use promoted Follow Up Plan: Telephone follow up appointment with care management team member scheduled for: 10-08-2020 at 0900 am    Task: RNCM: Identify and Manage Contributors to Fall Risk   Note:   Care Management Activities:    - activities of daily living skills assessed - barriers to physical activity or exercise addressed - barriers to physical activity or exercise identified - barriers to safety identified - cognition assessed - cognitive-stimulating activities promoted - fall prevention plan reviewed and updated - fear of falling, loss of independence and pain acknowledged - medication list reviewed - modification of home and work environment promoted - vision and/or hearing aid use promoted       Care Plan : RNCM: Chronic Pain (Adult)- bilateral legs and left side  Updates made by Jodi Keith since 08/26/2020 12:00 AM    Problem: RNCM; Pain Management Plan (Chronic Pain) bilateral legs and left side   Priority: High    Goal: RNCM: Pain Management Plan Developed   Priority: High  Note:   Current Barriers:  Marland Kitchen Knowledge Deficits related to managing acute/chronic pain . Non-adherence to scheduled provider appointments . Non-adherence to prescribed medication regimen . Difficulty obtaining medications . Chronic Disease Management support and education needs related to chronic pain . Unable to independently manage pain and discomfort to bilateral legs and left side  . Does not adhere to prescribed medication regimen . Does not contact provider office for questions/concerns Nurse Case Manager Clinical Goal(s):  . patient will verbalize understanding of plan for managing pain . patient will attend all scheduled medical appointments: 09-09-2020 . patient will demonstrate use of different relaxation  skills and/or diversional activities to assist with pain reduction (distraction, imagery, relaxation, massage, acupressure, TENS, heat, and cold application . patient will report pain at a level less than 3 to 4 on a 10-10 rating scale- reports her pain level at a 7 today . patient will use pharmacological  and nonpharmacological pain relief strategies . patient will verbalize acceptable level of pain relief and ability to engage in desired activities . patient will engage in desired activities without an increase in pain level Interventions:  . Collaboration with Charlynne Cousins, MD regarding development and update of comprehensive plan of care as evidenced by provider attestation and co-signature . Inter-disciplinary care team collaboration (see longitudinal plan of care) . - deep breathing, relaxation and mindfulness use promoted . - effectiveness of pharmacologic therapy monitored . - medication-induced side effects managed . - misuse of pain medication assessed . - motivation and barriers to change assessed and addressed . - mutually acceptable comfort goal set . - pain assessed . - pain treatment goals reviewed . Evaluation of current treatment plan related to pain and patient's adherence to plan as established by provider. . Advised patient to call the office for changes in level or intensity of pain and discomfort, take medications as directed and work with the CCM team to optimize a plan of care for effective management of pain and discomfort.  . Provided education to patient re: taking Ibuprofen as prescribed, discussing her pain concerns with the pcp. The patient states she has tried to talk to the pcp about her pain but feels the pcp is not listening. She says she is having everything done but  the pain being addressed. Reflective listening with education about finding out the root issues of her  . Reviewed medications with patient and discussed the concern of the patient taking Ibuprofen for pain relief and taking 6 pills at one time multiple times a day. The patient states that is the only relief she can get. Has Naproxen but states that does not help her and makes her stomach hurt. Explained the effects of Ibuprofen on the body systems and the need to take as directed. Will discuss with the  pcp and pharm D.  . Collaborated with pcp and pharm D regarding  misuse of Ibuprofen. The patient reports she is taking 6 pills at a time 5 to 6 times a day for her chronic pain and discomfort. Education given.  . Discussed plans with patient for ongoing care management follow up and provided patient with direct contact information for care management team . Allow patient to maintain a diary of pain ratings, timing, precipitating events, medications, treatments, and what works best to relieve pain,  . Refer to support groups and self-help groups . Educate patient about the use of pharmacological interventions for pain management- antianxiety, antidepressants, NSAIDS, opioid analgesics,  . Explain the importance of lifestyle modifications to effective pain management  Patient Goals/Self Care Activities:  . - mutually acceptable comfort goal set . - pain assessed . - pain management plan developed . - pain treatment goals reviewed . - patient response to treatment assessed . - sharing of pain management plan with teachers and other caregivers encouraged . Self-administers medications as prescribed . Attends all scheduled provider appointments . Calls pharmacy for medication refills . Calls provider office for new concerns or questions Follow Up Plan: Telephone follow up appointment with care management team member scheduled for: 10-08-2020 at 0900 am    Timeframe:  Short-Term Goal Priority:  High Start Date:              08-26-2020               Expected End Date:   12-16-2020                    Follow Up Date 10-08-2020   - develop a personal pain management plan - plan exercise or activity when pain is best controlled - prioritize tasks for the day - track times pain is worst and when it is best - track what makes the pain worse and what makes it better - use ice or heat for pain relief - work slower and less intense when having pain    Why is this important?    Day-to-day life can  be hard when you have chronic pain.   Pain medicine is just one piece of the treatment puzzle.   You can try these action steps to help you manage your pain.        Task: RNCM: Partner to Develop Chronic Pain Management Plan   Note:   Care Management Activities:    - mutually acceptable comfort goal set - pain assessed - pain management plan developed - pain treatment goals reviewed - patient response to treatment assessed - sharing of pain management plan with teachers and other caregivers encouraged       Care Plan : RNCM; Hypertension (Adult)  Updates made by Jodi Keith since 08/26/2020 12:00 AM    Problem: RNCM: Hypertension (Hypertension)   Priority: Medium    Long-Range Goal: RNCM; Hypertension Monitored   Priority: Medium  Note:   Objective:  . Last practice recorded BP readings:  BP Readings from Last 3 Encounters:  08/05/20 118/83  08/04/20 126/84  07/30/20 139/85 .   Marland Kitchen Most recent eGFR/CrCl:  Lab Results  Component Value Date   EGFR 64 07/30/2020 .    No components found for: CRCL Current Barriers:  Marland Kitchen Knowledge Deficits related to basic understanding of hypertension pathophysiology and self care management . Knowledge Deficits related to understanding of medications prescribed for management of hypertension . Non-adherence to prescribed medication regimen . Does not adhere to prescribed medication regimen . Does not contact provider office for questions/concerns Case Manager Clinical Goal(s):  . patient will verbalize understanding of plan for hypertension management . patient will attend all scheduled medical appointments: 09-09-2020 at 340 pm . patient will demonstrate improved adherence to prescribed treatment plan for hypertension as evidenced by taking all medications as prescribed, monitoring and recording blood pressure as directed, adhering to low sodium/DASH diet . patient will demonstrate improved health management independence as evidenced  by checking blood pressure as directed and notifying PCP if SBP>160 or DBP > 90, taking all medications as prescribe, and adhering to a low sodium diet as discussed. . patient will verbalize basic understanding of hypertension disease process and self health management plan as evidenced by compliance with heart healthy diet, compliance with medications, and working with the CCM team to manage health and well being Interventions:  . Collaboration with Charlynne Cousins, MD regarding development and update of comprehensive plan of care as evidenced by provider attestation and co-signature . Inter-disciplinary care team collaboration (see longitudinal plan of care) . Evaluation of current treatment plan related to hypertension self management and patient's adherence to plan as established by provider. Is having swelling in her feet and legs. Scheduled for stress test tomorrow for evaluation. Education on elevation of legs. She does not like to wear anything on her legs, not even pants touching them. Review of ways to help with edema in legs and feet.  . Provided education to patient re: stroke prevention, s/s of heart attack and stroke, DASH diet, complications of uncontrolled blood pressure . Reviewed medications with patient and discussed importance of compliance . Discussed plans with patient for ongoing care management follow up and provided patient with direct contact information for care management team . Advised patient, providing education and rationale, to monitor blood pressure daily and record, calling PCP for findings outside established parameters.  . Reviewed scheduled/upcoming provider appointments including: 09-09-2020 at 340 pm Self-Care Activities: - Self administers medications as prescribed Attends all scheduled provider appointments Calls provider office for new concerns, questions, or BP outside discussed parameters Checks BP and records as discussed Follows a low sodium diet/DASH  diet Patient Goals: - check blood pressure weekly - choose a place to take my blood pressure (home, clinic or office, retail store) - write blood pressure results in a log or diary - agree on reward when goals are met - agree to work together to make changes - ask questions to understand - have a family meeting to talk about healthy habits - learn about high blood pressure  - blood pressure trends reviewed - depression screen reviewed - home or ambulatory blood pressure monitoring encouraged Follow Up Plan: Telephone follow up appointment with care management team member scheduled for: 10-08-2020 at 0900 am   Task: RNCM: Identify and Monitor Blood Pressure Elevation   Note:   Care Management Activities:    - blood pressure trends reviewed -  depression screen reviewed - home or ambulatory blood pressure monitoring encouraged       Care Plan : RNCM: Asthma (Adult)  Updates made by Jodi Keith since 08/26/2020 12:00 AM    Problem: RNCM: Disease Progression (Asthma)   Priority: Medium    Long-Range Goal: Disease Progression Prevented or Minimized   Priority: Medium  Note:   Current Barriers:  Marland Kitchen Knowledge deficits related to basic understanding of asthma disease process . Knowledge deficits related to basic asthma  self care/management . Knowledge deficit related to importance of energy conservation . Unable to independently manage asthma exacerbations  . Does not maintain contact with provider office . Does not contact provider office for questions/concerns  Case Manager Clinical Goal(s):  patient will report utilizing pursed lip breathing for shortness of breath  patient will verbalize understanding of asthma action plan and when to seek appropriate levels of medical care  patient will engage in lite exercise as tolerated to build/regain stamina and strength and reduce shortness of breath through activity tolerance  patient will verbalize basic understanding of asthma  disease process and self care activities  Interventions:  . Collaboration with Charlynne Cousins, MD regarding development and update of comprehensive plan of care as evidenced by provider attestation and co-signature . Inter-disciplinary care team collaboration (see longitudinal plan of care)  UNABLE to independently: manage asthma during periods of exacerbation   Provided patient with basic written and verbal asthma education on self care/management/and exacerbation prevention. The patient states that she needs a refill for her albuterol as the one she has is outdated. She states she has had asthma for a long time that started in her childhood. She only uses her inhaler when she needs it. Will touch base with pcp and collaborate with patients expressed needs. Explained to the patient that she may need to be evaluated by the pcp before an inhaler could be prescribed. Also advised the patient about smoking causing exacerbation. See care plan for smoking cessation.   Provided patient with asthma action plan and reinforced importance of daily self assessment  Provided instruction about proper use of medications used for management of asthma including inhalers  Advised patient to self assesses asthma  action plan zone and make appointment with provider if in the yellow zone for 48 hours without improvement.  Provided patient with education about the role of exercise in the management of asthma   Advised patient to engage in light exercise as tolerated 3-5 days a week  Provided education about and advised patient to utilize infection prevention strategies to reduce risk of respiratory infection  Self-Care Activities:  Patient verbalizes understanding of plan to effective management of asthma  Self administers medications as prescribed Attends all scheduled provider appointments Calls pharmacy for medication refills Attends church or other social activities Performs ADL's independently Performs  IADL's independently Calls provider office for new concerns or questions Patient Goals: - do breathing exercises every day - do breathing exercises at least 2 times each day - do exercises in a comfortable position that makes breathing as easy as possible - develop a new routine to improve sleep - don't eat or exercise right before bedtime - eat healthy - get at least 7 to 8 hours of sleep at night - get outdoors every day (weather permitting) - keep room cool and dark - limit daytime naps - practice relaxation or meditation daily - use a fan or white noise in bedroom - use devices that will help like a cane,  sock-puller or reacher - begin a symptom diary - bring symptom diary to all visits - develop a rescue plan - eliminate symptom triggers at home - follow rescue plan if symptoms flare-up - keep follow-up appointments - use an extra pillow to sleep - avoid second hand smoke - eliminate smoking in my home - identify and avoid work-related triggers - identify and remove indoor air pollutants - limit outdoor activity during cold weather - listen for public air quality announcements every day - activity or exercise based on tolerance encouraged - barriers to medication adherence identified - emotional support provided - medication-adherence assessment completed - screen for functional limitations reviewed - side effects of medication monitored - self-awareness of symptom triggers encouraged - symptom control monitored - symptom log or asthma action plan reviewed - symptom triggers identified Follow Up Plan: Telephone follow up appointment with care management team member scheduled for: 10-08-2020 at 0900 am   Task: RNCM: Monitor and Manage Adherence to Asthma Therapy   Note:   Care Management Activities:    - activity or exercise based on tolerance encouraged - barriers to medication adherence identified - emotional support provided - medication-adherence assessment  completed - screen for functional limitations reviewed - side effects of medication monitored - self-awareness of symptom triggers encouraged - symptom control monitored - symptom log or asthma action plan reviewed - symptom triggers identified       Problem: RNCM: Smoking cessation   Priority: High    Long-Range Goal: RNCM: Smoking Cessation   Priority: High  Note:   Current Barriers:  . Unable to independently stop smoking.  [place] social connections . Does not contact provider office for questions/concerns . Tobacco abuse of >30 years; currently smoking 1 ppd . Previous quit attempts, unsuccessful several successful using 3 times while she was pregnant and quit because she did not want to harm the baby  . Reports smoking within 30 minutes of waking up . Reports triggers to smoke include: anxiety, not having the answers to her health problems . Reports motivation to quit smoking includes: knows it would be better for her health and well being  . On a scale of 1-10, reports MOTIVATION to quit is 7 . On a scale of 1-10, reports CONFIDENCE in quitting is 7 Clinical Goal(s):  . patient will work with Chief Operating Officer and provider towards tobacco cessation  Interventions: . Collaboration with Charlynne Cousins, MD regarding development and update of comprehensive plan of care as evidenced by provider attestation and co-signature . Inter-disciplinary care team collaboration (see longitudinal plan of care) . Evaluation of current treatment plan reviewed- the patient wants to quit but states she smokes more when her anxiety level is up. Is interested in medications to help with quitting but states she can not afford. Pharm D referral.  . Provided contact information for [facility] (1-800-QUIT-NOW). Patient will outreach this group for support. . Discussed plans with patient for ongoing care management follow up and provided patient with direct  contact information for care management team . Provided patient with printed smoking cessation educational materials . Reviewed scheduled/upcoming provider appointments including: 09-09-2020 . Referred patient to pharmacy team . Provided contact information for [facility] (1-800-QUIT-NOW). Patient will outreach this group for support. . Evaluation of current treatment plan reviewed Patient Goals/Self-Care Activities - diversion activities as a habit during cravings  - verbally commit to reducing tobacco consumption - adherence to treatment plan encouraged - barriers to  adherence to treatment plan identified - lifestyle changes encouraged - psychosocial concerns monitored - symptoms of respiratory distress monitored  Follow Up Plan: Telephone follow up appointment with care management team member scheduled for: 10-08-2020 at 0900 am   Task: RNCM: Smoking cessation   Note:   Care Management Activities:    - adherence to treatment plan encouraged - barriers to adherence to treatment plan identified - lifestyle changes encouraged - psychosocial concerns monitored - symptoms of respiratory distress monitored          Plan: Telephone follow up appointment with care management team member scheduled for:  10-08-2020 at 0900 am  Noreene Larsson RN, MSN, [facility] Mobile: [phone]

## 2020-08-26 NOTE — Chronic Care Management (AMB) (Deleted)
Care Management    RN Visit Note  08/26/2020 Name: Jodi Keith MRN: [id number] DOB: 04/19/1966  Subjective: Jodi Keith is a 55 y.o. year old female who is a primary care patient of Vigg, Avanti, MD. The care management team was consulted for assistance with disease management and care coordination needs.    Engaged with patient by telephone for initial visit in response to provider referral for case management and/or care coordination services.   Consent to Services:   Jodi Keith was given information about Care Management services today including:  1. Care Management services includes personalized support from designated clinical staff supervised by her physician, including individualized plan of care and coordination with other care providers 2. 24/7 contact phone numbers for assistance for urgent and routine care needs. 3. The patient may stop case management services at any time by phone call to the office staff.  Patient agreed to services and consent obtained.   Assessment: Review of patient past medical history, allergies, medications, health status, including review of consultants reports, laboratory and other test data, was performed as part of comprehensive evaluation and provision of chronic care management services.   SDOH (Social Determinants of Health) assessments and interventions performed:    Care Plan  Allergies  Allergen Reactions  . Prochlorperazine Edisylate Anaphylaxis    coma    Outpatient Encounter Medications as of 08/26/2020  Medication Sig Note  . amLODipine (NORVASC) 10 MG tablet Take 1 tablet by mouth daily.   . famotidine (PEPCID) 20 MG tablet Take 1 tablet (20 mg total) by mouth at bedtime.   . hydrochlorothiazide (MICROZIDE) 12.5 MG capsule Take 1 capsule (12.5 mg total) by mouth daily.   Marland Kitchen ibuprofen (ADVIL) 200 MG tablet Take 200 mg by mouth every 4 (four) hours as needed (patient states that she takes 6 at a time 5 or 6 times a day).  Education- will do pharmacy referral   . losartan (COZAAR) 50 MG tablet Take 1 tablet (50 mg total) by mouth daily.   . naproxen (NAPROSYN) 500 MG tablet Take 1 tablet (500 mg total) by mouth 2 (two) times daily as needed. 08/05/2020: Makes patient sick to her stomach  . nystatin cream (MYCOSTATIN) Apply 1 application topically 2 (two) times daily.    No facility-administered encounter medications on file as of 08/26/2020.    Patient Active Problem List   Diagnosis Date Noted  . Hypertensive emergency 05/02/2018    Conditions to be addressed/monitored: HTN and asthma, chronic pain, falls prevention and safety  Care Plan : RNCM: Fall Risk (Adult)  Updates made by Marlowe Sax since 08/26/2020 12:00 AM    Problem: RNCM: Fall Risk   Priority: High    Goal: RNCM: Absence of Fall and Fall-Related Injury   Priority: High  Note:    Current Barriers:  Marland Kitchen Knowledge Deficits related to fall precautions in patient with chronic pain and weakness in legs, causing falls at times  . Decreased adherence to prescribed treatment for fall prevention . Unable to independently manage safety in the home as evidence of fall over the past weekend due to leg weakness and giving away . Does not contact provider office for questions/concerns . Knowledge Deficits related to fall prevention and safety  . Chronic Disease Management support and education needs related to preventing falls in a patient with multiple chronic conditions with recent fall without injury  Clinical Goal(s):  . patient will demonstrate improved adherence to prescribed treatment plan for decreasing  falls as evidenced by patient reporting and review of EMR . patient will verbalize using fall risk reduction strategies discussed . patient will not experience additional falls . patient will verbalize understanding of plan for effective management of falls prevention and safety . patient will work with RNCM, pcp, and CCM team  to address needs  related to falls and safety in the patients environment  . patient will attend all scheduled medical appointments: 09-09-2020 at 340 pm . patient will demonstrate improved adherence to prescribed treatment plan for falls prevention and safety  as evidenced byno new falls, being safe in her environment and working with the CCM team and pcp to optimize health and well being.  Interventions:  . Collaboration with Charlynne Cousins, MD regarding development and update of comprehensive plan of care as evidenced by provider attestation and co-signature . Inter-disciplinary care team collaboration (see longitudinal plan of care) . Provided written and verbal education re: Potential causes of falls and Fall prevention strategies . Reviewed medications and discussed potential side effects of medications such as dizziness and frequent urination . Assessed for s/s of orthostatic hypotension . Assessed for falls since last encounter. Fall over the weekend of May 7th . Assessed patients knowledge of fall risk prevention secondary to previously provided education. . Assessed working status of life alert bracelet and patient adherence . Provided patient information for fall alert systems . Evaluation of current treatment plan related to falls prevention and safety and patient's adherence to plan as established by provider. . Advised patient to call the office for new falls, for concerns, or questions  . Provided education to patient re: being safe in her surroundings and monitoring triggers that cause falls. The patient states that she often falls because her legs give away and cause her to fall. Left leg weakness with last fall over the weekend.  . Reviewed medications with patient and discussed compliance  . Provided patient with falls prevention and safety educational materials related to preventing falls through the [facility] and my chart system . Reviewed scheduled/upcoming provider appointments including: 09-09-2020 at  340 pm . Discussed plans with patient for ongoing care management follow up and provided patient with direct contact information for care management team Self-Care Deficits:  Unable to independently manage safety as evidence of fall over the weekend, without injury Does not attend all scheduled provider appointments Does not adhere to prescribed medication regimen Does not maintain contact with provider office Does not contact provider office for questions/concerns Patient Goals:  - Utilize safety appropriately with all ambulation- the patient currently does not use any DME for ambulation  - De-clutter walkways - Change positions slowly - Wear secure fitting shoes at all times with ambulation - Utilize home lighting for dim lit areas - Demonstrate self and pet awareness at all times - activities of daily living skills assessed - barriers to physical activity or exercise addressed - barriers to physical activity or exercise identified - barriers to safety identified - cognition assessed - cognitive-stimulating activities promoted - fall prevention plan reviewed and updated - fear of falling, loss of independence and pain acknowledged - medication list reviewed - modification of home and work environment promoted - vision and/or hearing aid use promoted Follow Up Plan: Telephone follow up appointment with care management team member scheduled for: 10-08-2020 at 0900 am   Task: RNCM: Identify and Manage Contributors to Fall Risk   Note:   Care Management Activities:    - activities of daily living skills assessed -  barriers to physical activity or exercise addressed - barriers to physical activity or exercise identified - barriers to safety identified - cognition assessed - cognitive-stimulating activities promoted - fall prevention plan reviewed and updated - fear of falling, loss of independence and pain acknowledged - medication list reviewed - modification of home and work  environment promoted - vision and/or hearing aid use promoted       Care Plan : RNCM: Chronic Pain (Adult)- bilateral legs and left side  Updates made by Vanita Ingles since 08/26/2020 12:00 AM    Problem: RNCM; Pain Management Plan (Chronic Pain) bilateral legs and left side   Priority: High    Goal: RNCM: Pain Management Plan Developed   Priority: High  Note:   Current Barriers:  Marland Kitchen Knowledge Deficits related to managing acute/chronic pain . Non-adherence to scheduled provider appointments . Non-adherence to prescribed medication regimen . Difficulty obtaining medications . Chronic Disease Management support and education needs related to chronic pain . Unable to independently manage pain and discomfort to bilateral legs and left side  . Does not adhere to prescribed medication regimen . Does not contact provider office for questions/concerns Nurse Case Manager Clinical Goal(s):  . patient will verbalize understanding of plan for managing pain . patient will attend all scheduled medical appointments: 09-09-2020 . patient will demonstrate use of different relaxation  skills and/or diversional activities to assist with pain reduction (distraction, imagery, relaxation, massage, acupressure, TENS, heat, and cold application . patient will report pain at a level less than 3 to 4 on a 10-10 rating scale- reports her pain level at a 7 today . patient will use pharmacological and nonpharmacological pain relief strategies . patient will verbalize acceptable level of pain relief and ability to engage in desired activities . patient will engage in desired activities without an increase in pain level Interventions:  . Collaboration with Charlynne Cousins, MD regarding development and update of comprehensive plan of care as evidenced by provider attestation and co-signature . Inter-disciplinary care team collaboration (see longitudinal plan of care) . - deep breathing, relaxation and mindfulness use  promoted . - effectiveness of pharmacologic therapy monitored . - medication-induced side effects managed . - misuse of pain medication assessed . - motivation and barriers to change assessed and addressed . - mutually acceptable comfort goal set . - pain assessed . - pain treatment goals reviewed . Evaluation of current treatment plan related to pain and patient's adherence to plan as established by provider. . Advised patient to call the office for changes in level or intensity of pain and discomfort, take medications as directed and work with the CCM team to optimize a plan of care for effective management of pain and discomfort.  . Provided education to patient re: taking Ibuprofen as prescribed, discussing her pain concerns with the pcp. The patient states she has tried to talk to the pcp about her pain but feels the pcp is not listening. She says she is having everything done but the pain being addressed. Reflective listening with education about finding out the root issues of her  . Reviewed medications with patient and discussed the concern of the patient taking Ibuprofen for pain relief and taking 6 pills at one time multiple times a day. The patient states that is the only relief she can get. Has Naproxen but states that does not help her and makes her stomach hurt. Explained the effects of Ibuprofen on the body systems and the need to take as directed. Will  discuss with the pcp and pharm D.  . Collaborated with pcp and pharm D regarding  misuse of Ibuprofen. The patient reports she is taking 6 pills at a time 5 to 6 times a day for her chronic pain and discomfort. Education given.  . Discussed plans with patient for ongoing care management follow up and provided patient with direct contact information for care management team . Allow patient to maintain a diary of pain ratings, timing, precipitating events, medications, treatments, and what works best to relieve pain,  . Refer to support  groups and self-help groups . Educate patient about the use of pharmacological interventions for pain management- antianxiety, antidepressants, NSAIDS, opioid analgesics,  . Explain the importance of lifestyle modifications to effective pain management  Patient Goals/Self Care Activities:  . - mutually acceptable comfort goal set . - pain assessed . - pain management plan developed . - pain treatment goals reviewed . - patient response to treatment assessed . - sharing of pain management plan with teachers and other caregivers encouraged . Self-administers medications as prescribed . Attends all scheduled provider appointments . Calls pharmacy for medication refills . Calls provider office for new concerns or questions Follow Up Plan: Telephone follow up appointment with care management team member scheduled for: 10-08-2020 at 0900 am    Timeframe:  Short-Term Goal Priority:  High Start Date:              08-26-2020               Expected End Date:   12-16-2020                    Follow Up Date 10-08-2020   - develop a personal pain management plan - plan exercise or activity when pain is best controlled - prioritize tasks for the day - track times pain is worst and when it is best - track what makes the pain worse and what makes it better - use ice or heat for pain relief - work slower and less intense when having pain    Why is this important?    Day-to-day life can be hard when you have chronic pain.   Pain medicine is just one piece of the treatment puzzle.   You can try these action steps to help you manage your pain.        Task: RNCM: Partner to Develop Chronic Pain Management Plan   Note:   Care Management Activities:    - mutually acceptable comfort goal set - pain assessed - pain management plan developed - pain treatment goals reviewed - patient response to treatment assessed - sharing of pain management plan with teachers and other caregivers encouraged        Care Plan : RNCM; Hypertension (Adult)  Updates made by Vanita Ingles since 08/26/2020 12:00 AM    Problem: RNCM: Hypertension (Hypertension)   Priority: Medium    Long-Range Goal: RNCM; Hypertension Monitored   Priority: Medium  Note:   Objective:  . Last practice recorded BP readings:  BP Readings from Last 3 Encounters:  08/05/20 118/83  08/04/20 126/84  07/30/20 139/85 .   Marland Kitchen Most recent eGFR/CrCl:  Lab Results  Component Value Date   EGFR 64 07/30/2020 .    No components found for: CRCL Current Barriers:  Marland Kitchen Knowledge Deficits related to basic understanding of hypertension pathophysiology and self care management . Knowledge Deficits related to understanding of medications prescribed for management of hypertension .  Non-adherence to prescribed medication regimen . Does not adhere to prescribed medication regimen . Does not contact provider office for questions/concerns Case Manager Clinical Goal(s):  . patient will verbalize understanding of plan for hypertension management . patient will attend all scheduled medical appointments: 09-09-2020 at 340 pm . patient will demonstrate improved adherence to prescribed treatment plan for hypertension as evidenced by taking all medications as prescribed, monitoring and recording blood pressure as directed, adhering to low sodium/DASH diet . patient will demonstrate improved health management independence as evidenced by checking blood pressure as directed and notifying PCP if SBP>160 or DBP > 90, taking all medications as prescribe, and adhering to a low sodium diet as discussed. . patient will verbalize basic understanding of hypertension disease process and self health management plan as evidenced by compliance with heart healthy diet, compliance with medications, and working with the CCM team to manage health and well being Interventions:  . Collaboration with [name], MD regarding development and update of comprehensive plan  of care as evidenced by provider attestation and co-signature . Inter-disciplinary care team collaboration (see longitudinal plan of care) . Evaluation of current treatment plan related to hypertension self management and patient's adherence to plan as established by provider. Is having swelling in her feet and legs. Scheduled for stress test tomorrow for evaluation. Education on elevation of legs. She does not like to wear anything on her legs, not even pants touching them. Review of ways to help with edema in legs and feet.  . Provided education to patient re: stroke prevention, s/s of heart attack and stroke, DASH diet, complications of uncontrolled blood pressure . Reviewed medications with patient and discussed importance of compliance . Discussed plans with patient for ongoing care management follow up and provided patient with direct contact information for care management team . Advised patient, providing education and rationale, to monitor blood pressure daily and record, calling PCP for findings outside established parameters.  . Reviewed scheduled/upcoming provider appointments including: 09-09-2020 at 340 pm Self-Care Activities: - Self administers medications as prescribed Attends all scheduled provider appointments Calls provider office for new concerns, questions, or BP outside discussed parameters Checks BP and records as discussed Follows a low sodium diet/DASH diet Patient Goals: - check blood pressure weekly - choose a place to take my blood pressure (home, clinic or office, retail store) - write blood pressure results in a log or diary - agree on reward when goals are met - agree to work together to make changes - ask questions to understand - have a family meeting to talk about healthy habits - learn about high blood pressure  - blood pressure trends reviewed - depression screen reviewed - home or ambulatory blood pressure monitoring encouraged Follow Up Plan: Telephone  follow up appointment with care management team member scheduled for: 10-08-2020 at 0900 am   Task: RNCM: Identify and Monitor Blood Pressure Elevation   Note:   Care Management Activities:    - blood pressure trends reviewed - depression screen reviewed - home or ambulatory blood pressure monitoring encouraged       Care Plan : RNCM: Asthma (Adult)  Updates made by Marlowe Sax since 08/26/2020 12:00 AM    Problem: RNCM: Disease Progression (Asthma)   Priority: Medium    Long-Range Goal: Disease Progression Prevented or Minimized   Priority: Medium  Note:   Current Barriers:  Marland Kitchen Knowledge deficits related to basic understanding of asthma disease process . Knowledge deficits related to basic asthma  self care/management . Knowledge deficit related to importance of energy conservation . Unable to independently manage asthma exacerbations  . Does not maintain contact with provider office . Does not contact provider office for questions/concerns  Case Manager Clinical Goal(s):  patient will report utilizing pursed lip breathing for shortness of breath  patient will verbalize understanding of asthma action plan and when to seek appropriate levels of medical care  patient will engage in lite exercise as tolerated to build/regain stamina and strength and reduce shortness of breath through activity tolerance  patient will verbalize basic understanding of asthma disease process and self care activities  Interventions:  . Collaboration with Charlynne Cousins, MD regarding development and update of comprehensive plan of care as evidenced by provider attestation and co-signature . Inter-disciplinary care team collaboration (see longitudinal plan of care)  UNABLE to independently: manage asthma during periods of exacerbation   Provided patient with basic written and verbal asthma education on self care/management/and exacerbation prevention. The patient states that she needs a refill for her  albuterol as the one she has is outdated. She states she has had asthma for a long time that started in her childhood. She only uses her inhaler when she needs it. Will touch base with pcp and collaborate with patients expressed needs. Explained to the patient that she may need to be evaluated by the pcp before an inhaler could be prescribed. Also advised the patient about smoking causing exacerbation. See care plan for smoking cessation.   Provided patient with asthma action plan and reinforced importance of daily self assessment  Provided instruction about proper use of medications used for management of asthma including inhalers  Advised patient to self assesses asthma  action plan zone and make appointment with provider if in the yellow zone for 48 hours without improvement.  Provided patient with education about the role of exercise in the management of asthma   Advised patient to engage in light exercise as tolerated 3-5 days a week  Provided education about and advised patient to utilize infection prevention strategies to reduce risk of respiratory infection  Self-Care Activities:  Patient verbalizes understanding of plan to effective management of asthma  Self administers medications as prescribed Attends all scheduled provider appointments Calls pharmacy for medication refills Attends church or other social activities Performs ADL's independently Performs IADL's independently Calls provider office for new concerns or questions Patient Goals: - do breathing exercises every day - do breathing exercises at least 2 times each day - do exercises in a comfortable position that makes breathing as easy as possible - develop a new routine to improve sleep - don't eat or exercise right before bedtime - eat healthy - get at least 7 to 8 hours of sleep at night - get outdoors every day (weather permitting) - keep room cool and dark - limit daytime naps - practice relaxation or  meditation daily - use a fan or white noise in bedroom - use devices that will help like a cane, sock-puller or reacher - begin a symptom diary - bring symptom diary to all visits - develop a rescue plan - eliminate symptom triggers at home - follow rescue plan if symptoms flare-up - keep follow-up appointments - use an extra pillow to sleep - avoid second hand smoke - eliminate smoking in my home - identify and avoid work-related triggers - identify and remove indoor air pollutants - limit outdoor activity during cold weather - listen for public air quality announcements every day - activity  or exercise based on tolerance encouraged - barriers to medication adherence identified - emotional support provided - medication-adherence assessment completed - screen for functional limitations reviewed - side effects of medication monitored - self-awareness of symptom triggers encouraged - symptom control monitored - symptom log or asthma action plan reviewed - symptom triggers identified Follow Up Plan: Telephone follow up appointment with care management team member scheduled for: 10-08-2020 at 0900 am   Task: RNCM: Monitor and Manage Adherence to Asthma Therapy   Note:   Care Management Activities:    - activity or exercise based on tolerance encouraged - barriers to medication adherence identified - emotional support provided - medication-adherence assessment completed - screen for functional limitations reviewed - side effects of medication monitored - self-awareness of symptom triggers encouraged - symptom control monitored - symptom log or asthma action plan reviewed - symptom triggers identified       Problem: RNCM: Smoking cessation   Priority: High    Long-Range Goal: RNCM: Smoking Cessation   Priority: High  Note:   Current Barriers:  . Unable to independently stop smoking.  Jodi Keith social connections . Does not contact provider office for  questions/concerns . Tobacco abuse of >30 years; currently smoking 1 ppd . Previous quit attempts, unsuccessful several successful using 3 times while she was pregnant and quit because she did not want to harm the baby  . Reports smoking within 30 minutes of waking up . Reports triggers to smoke include: anxiety, not having the answers to her health problems . Reports motivation to quit smoking includes: knows it would be better for her health and well being  . On a scale of 1-10, reports MOTIVATION to quit is 7 . On a scale of 1-10, reports CONFIDENCE in quitting is 7 Clinical Goal(s):  . patient will work with Chief Operating Officer and provider towards tobacco cessation  Interventions: . Collaboration with Charlynne Cousins, MD regarding development and update of comprehensive plan of care as evidenced by provider attestation and co-signature . Inter-disciplinary care team collaboration (see longitudinal plan of care) . Evaluation of current treatment plan reviewed- the patient wants to quit but states she smokes more when her anxiety level is up. Is interested in medications to help with quitting but states she can not afford. Pharm D referral.  . Provided contact information for [place] [facility]uit [facility] (1-800-QUIT-NOW). Patient will outreach this group for support. . Discussed plans with patient for ongoing care management follow up and provided patient with direct contact information for care management team . Provided patient with printed smoking cessation educational materials . Reviewed scheduled/upcoming provider appointments including: 09-09-2020 . Referred patient to pharmacy team . Provided contact information for  Quit Line (1-800-QUIT-NOW). Patient will outreach this group for support. . Evaluation of current treatment plan reviewed Patient Goals/Self-Care Activities - diversion activities as a habit during cravings  - verbally commit to reducing tobacco  consumption - adherence to treatment plan encouraged - barriers to adherence to treatment plan identified - lifestyle changes encouraged - psychosocial concerns monitored - symptoms of respiratory distress monitored  Follow Up Plan: Telephone follow up appointment with care management team member scheduled for: 10-08-2020 at 0900 am   Task: RNCM: Smoking cessation   Note:   Care Management Activities:    - adherence to treatment plan encouraged - barriers to adherence to treatment plan identified - lifestyle changes encouraged - psychosocial concerns monitored - symptoms of respiratory distress monitored  Plan: Telephone follow up appointment with care management team member scheduled for:  10-08-2020 at 30 am  [place], MSN, [facility] Mobile: [phone]

## 2020-08-26 NOTE — Telephone Encounter (Signed)
  Chronic Care Management   Outreach Note  08/26/2020 Name: Jodi Keith MRN: [id number] DOB: 1965-09-07  Referred by: [name], MD Reason for referral : Care Coordination (RNCM: Initial Outreach for Chronic Disease Management and Care Coordination Needs- Call attempt)   The patient called back and the patient call completed. See new encounter.   Follow Up Plan: Telephone follow up appointment with care management team member scheduled for: 10-08-2020 at 0900 am  Alto Denver RN, MSN, CCM Community Care Coordinator [place]  [facility] Mobile: [phone]

## 2020-08-26 NOTE — Progress Notes (Signed)
Erroneous encounter

## 2020-08-27 ENCOUNTER — Telehealth: Payer: Self-pay

## 2020-08-27 NOTE — Telephone Encounter (Signed)
Pt returning your call. Please call back °

## 2020-08-27 NOTE — Chronic Care Management (AMB) (Signed)
  Chronic Care Management   Note  08/27/2020 Name: Jodi Keith MRN: [id number] DOB: June 26, 1965  Jodi Keith is a 55 y.o. year old female who is a primary care patient of Vigg, Avanti, MD. Jodi Keith is currently enrolled in care management services. An additional referral for Licensed Clinical SW and Pharm D  was placed.   Follow up plan: Unsuccessful telephone outreach attempt made. A HIPAA compliant phone message was left for the patient providing contact information and requesting a return call.  The care management team will reach out to the patient again over the next 4 days.  If patient returns call to provider office, please advise to call Embedded Care Management Care Guide Jodi Keith  at [phone]  Jodi Keith, RMA Care Guide, Embedded Care Coordination [facility]  [place], [place] [address] Direct Dial: [phone] [email or web address] Website: [email or web address]

## 2020-08-28 ENCOUNTER — Telehealth: Payer: Self-pay | Admitting: Internal Medicine

## 2020-08-28 NOTE — Telephone Encounter (Signed)
Please let patient know that her thyroid US showed a nodule which looked stable per the Radiologist.  They recommend she repeat it in 1 year to ensure its stability.  The scan also showed some abnormality of the thyroid which is consistent with her elevated TSH.  When Dr. Charlotta Newton returns, she will let patient know further treatment recommendations.

## 2020-08-28 NOTE — Telephone Encounter (Signed)
Patient would like to speak with someone regarding some test results.  Patient would not give any other details.  Please call patient to discuss at [phone]

## 2020-08-28 NOTE — Telephone Encounter (Signed)
Please advise 

## 2020-08-28 NOTE — Telephone Encounter (Signed)
Spoke with patient and notified of recent ultrasound imaging results per Larae Grooms, NP. Patient verbalized understanding and has no further questions.

## 2020-08-29 NOTE — Chronic Care Management (AMB) (Signed)
  Chronic Care Management   Note  08/29/2020 Name: Jodi Keith MRN: [id number] DOB: 1965-11-25  Jodi Keith is a 55 y.o. year old female who is a primary care patient of Vigg, Avanti, MD. Jodi Keith is currently enrolled in care management services. An additional referral for LCSW and Pharm D was placed.   Follow up plan: Telephone appointment with care management team member scheduled for:  Pharm D 09/22/2020 LCSW 10/03/2020   Penne Lash, RMA Care Guide, Embedded Care Coordination [facility]  [place], [place] [address] Direct Dial: [phone] [email or web address] Website: [email or web address]

## 2020-09-08 ENCOUNTER — Telehealth: Payer: 59 | Admitting: Internal Medicine

## 2020-09-09 ENCOUNTER — Telehealth (INDEPENDENT_AMBULATORY_CARE_PROVIDER_SITE_OTHER): Payer: 59 | Admitting: Internal Medicine

## 2020-09-09 ENCOUNTER — Ambulatory Visit: Payer: Self-pay | Admitting: General Practice

## 2020-09-09 ENCOUNTER — Encounter: Payer: Self-pay | Admitting: Internal Medicine

## 2020-09-09 ENCOUNTER — Ambulatory Visit: Payer: 59 | Admitting: Podiatry

## 2020-09-09 DIAGNOSIS — E041 Nontoxic single thyroid nodule: Secondary | ICD-10-CM

## 2020-09-09 DIAGNOSIS — G8929 Other chronic pain: Secondary | ICD-10-CM

## 2020-09-09 MED ORDER — LEVOTHYROXINE SODIUM 25 MCG PO TABS: 25.0000 ug | ORAL_TABLET | Freq: Every day | ORAL | 3 refills | Status: DC

## 2020-09-09 MED ORDER — ATORVASTATIN CALCIUM 20 MG PO TABS: 20.0000 mg | ORAL_TABLET | Freq: Every day | ORAL | 3 refills | Status: DC

## 2020-09-09 NOTE — Chronic Care Management (AMB) (Signed)
Care Management    RN Visit Note  09/09/2020 Name: Jodi Keith MRN: [id number] DOB: [birth year]  Subjective: Jodi Keith is a 55 y.o. year old female who is a primary care patient of Vigg, Avanti, MD. The care management team was consulted for assistance with disease management and care coordination needs.    Engaged with patient by telephone for follow up visit in response to provider referral for case management and/or care coordination services.   Consent to Services:   [name] was given information about Care Management services today including:  1. Care Management services includes personalized support from designated clinical staff supervised by her physician, including individualized plan of care and coordination with other care providers 2. 24/7 contact phone numbers for assistance for urgent and routine care needs. 3. The patient may stop case management services at any time by phone call to the office staff.  Patient agreed to services and consent obtained.   Assessment: Review of patient past medical history, allergies, medications, health status, including review of consultants reports, laboratory and other test data, was performed as part of comprehensive evaluation and provision of chronic care management services.   SDOH (Social Determinants of Health) assessments and interventions performed:    Care Plan  Allergies  Allergen Reactions  . Prochlorperazine Edisylate Anaphylaxis    coma    Outpatient Encounter Medications as of 09/09/2020  Medication Sig Note  . amLODipine (NORVASC) 10 MG tablet Take 1 tablet by mouth daily.   Marland Kitchen atorvastatin (LIPITOR) 20 MG tablet Take 1 tablet (20 mg total) by mouth daily.   . famotidine (PEPCID) 20 MG tablet Take 1 tablet (20 mg total) by mouth at bedtime.   . hydrochlorothiazide (MICROZIDE) 12.5 MG capsule Take 1 capsule (12.5 mg total) by mouth daily. (Patient not taking: Reported on 09/09/2020)   . ibuprofen  (ADVIL) 200 MG tablet Take 200 mg by mouth every 4 (four) hours as needed (patient states that she takes 6 at a time 5 or 6 times a day). Education- will do pharmacy referral   . levothyroxine (SYNTHROID) 25 MCG tablet Take 1 tablet (25 mcg total) by mouth daily.   Marland Kitchen losartan (COZAAR) 50 MG tablet Take 1 tablet (50 mg total) by mouth daily.   . metoprolol tartrate (LOPRESSOR) 100 MG tablet Take 100 mg by mouth daily.   . naproxen (NAPROSYN) 500 MG tablet Take 1 tablet (500 mg total) by mouth 2 (two) times daily as needed. 08/05/2020: Makes patient sick to her stomach  . nystatin cream (MYCOSTATIN) Apply 1 application topically 2 (two) times daily.   Marland Kitchen spironolactone (ALDACTONE) 25 MG tablet Take 0.5 tablets by mouth daily.    No facility-administered encounter medications on file as of 09/09/2020.    Patient Active Problem List   Diagnosis Date Noted  . Hypertensive emergency 05/02/2018    Conditions to be addressed/monitored: chroinic pain and new thyroid medicaition questions  Care Plan : RNCM: Chronic Pain (Adult)- bilateral legs and left side  Updates made by Marlowe Sax since 09/09/2020 12:00 AM    Problem: RNCM; Pain Management Plan (Chronic Pain) bilateral legs and left side   Priority: High    Goal: RNCM: Pain Management Plan Developed   Priority: High  Note:   Current Barriers:  Marland Kitchen Knowledge Deficits related to managing acute/chronic pain . Non-adherence to scheduled provider appointments . Non-adherence to prescribed medication regimen . Difficulty obtaining medications . Chronic Disease Management support and education needs related to  chronic pain . Unable to independently manage pain and discomfort to bilateral legs and left side  . Does not adhere to prescribed medication regimen . Does not contact provider office for questions/concerns Nurse Case Manager Clinical Goal(s):  . patient will verbalize understanding of plan for managing pain . patient will attend all  scheduled medical appointments: 09-09-2020 . patient will demonstrate use of different relaxation  skills and/or diversional activities to assist with pain reduction (distraction, imagery, relaxation, massage, acupressure, TENS, heat, and cold application . patient will report pain at a level less than 3 to 4 on a 10-10 rating scale- reports her pain level at a 7 today . patient will use pharmacological and nonpharmacological pain relief strategies . patient will verbalize acceptable level of pain relief and ability to engage in desired activities . patient will engage in desired activities without an increase in pain level Interventions:  . Collaboration with [name], MD regarding development and update of comprehensive plan of care as evidenced by provider attestation and co-signature . Inter-disciplinary care team collaboration (see longitudinal plan of care) . - deep breathing, relaxation and mindfulness use promoted . - effectiveness of pharmacologic therapy monitored . - medication-induced side effects managed . - misuse of pain medication assessed . - motivation and barriers to change assessed and addressed . - mutually acceptable comfort goal set . - pain assessed . - pain treatment goals reviewed . Evaluation of current treatment plan related to pain and patient's adherence to plan as established by provider. 09-09-2020: The  patient called and sent a message through CRM that she wanted to talk to "Nurse Pam".  Call made to the patient and she was concerned about the new thyroid medications that pcp placed her on today and the side effects.  The patient states she is frustrated that her pain is not being addressed. Empathetic listening and support given. Advised the patient to talk to the pcp about her concerns and frustrations. Also advised the patient to take the medication for her thyroid and give it an honest try. The patient verbalized understanding.  . Advised patient to call the  office for changes in level or intensity of pain and discomfort, take medications as directed and work with the CCM team to optimize a plan of care for effective management of pain and discomfort.  . Provided education to patient re: taking Ibuprofen as prescribed, discussing her pain concerns with the pcp. The patient states she has tried to talk to the pcp about her pain but feels the pcp is not listening. She says she is having everything done but the pain being addressed. Reflective listening with education about finding out the root issues of her. 09-09-2020: Review with the patient and the patient does not feel her concerns are being addressed.  . Reviewed medications with patient and discussed the concern of the patient taking Ibuprofen for pain relief and taking 6 pills at one time multiple times a day. The patient states that is the only relief she can get. Has Naproxen but states that does not help her and makes her stomach hurt. Explained the effects of Ibuprofen on the body systems and the need to take as directed. Will discuss with the pcp and pharm D.  . Collaborated with pcp and pharm D regarding  misuse of Ibuprofen. The patient reports she is taking 6 pills at a time 5 to 6 times a day for her chronic pain and discomfort. Education given.  . Discussed plans with patient  for ongoing care management follow up and provided patient with direct contact information for care management team . Allow patient to maintain a diary of pain ratings, timing, precipitating events, medications, treatments, and what works best to relieve pain,  . Refer to support groups and self-help groups . Educate patient about the use of pharmacological interventions for pain management- antianxiety, antidepressants, NSAIDS, opioid analgesics,  . Explain the importance of lifestyle modifications to effective pain management  Patient Goals/Self Care Activities:  . - mutually acceptable comfort goal set . - pain  assessed . - pain management plan developed . - pain treatment goals reviewed . - patient response to treatment assessed . - sharing of pain management plan with teachers and other caregivers encouraged . Self-administers medications as prescribed . Attends all scheduled provider appointments . Calls pharmacy for medication refills . Calls provider office for new concerns or questions Follow Up Plan: Telephone follow up appointment with care management team member scheduled for: 11-04-2020 at 0945am    Timeframe:  Short-Term Goal Priority:  High Start Date:              08-26-2020               Expected End Date:   12-16-2020                    Follow Up Date 11-04-2020   - develop a personal pain management plan - plan exercise or activity when pain is best controlled - prioritize tasks for the day - track times pain is worst and when it is best - track what makes the pain worse and what makes it better - use ice or heat for pain relief - work slower and less intense when having pain    Why is this important?    Day-to-day life can be hard when you have chronic pain.   Pain medicine is just one piece of the treatment puzzle.   You can try these action steps to help you manage your pain.          Plan: Telephone follow up appointment with care management team member scheduled for:  11-04-2020 at 0945 am  Alto Denver RN, MSN, CCM Community Care Coordinator [place]  [facility] Mobile: [phone]

## 2020-09-09 NOTE — Patient Instructions (Signed)
Visit Information  Goals Addressed            This Visit's Progress   . RNCM: Manage Chronic Pain       Timeframe:  Short-Term Goal Priority:  High Start Date:              08-26-2020               Expected End Date:   12-16-2020                    Follow Up Date 11-04-2020   - develop a personal pain management plan - plan exercise or activity when pain is best controlled - prioritize tasks for the day - track times pain is worst and when it is best - track what makes the pain worse and what makes it better - use ice or heat for pain relief - work slower and less intense when having pain    Why is this important?    Day-to-day life can be hard when you have chronic pain.   Pain medicine is just one piece of the treatment puzzle.   You can try these action steps to help you manage your pain.    09-09-2020: The patient is frustrated and does not feel her pain is being addressed. Will collaborate with the pcp      Patient Care Plan: RNCM: Fall Risk (Adult)    Problem Identified: RNCM: Fall Risk   Priority: High    Goal: RNCM: Absence of Fall and Fall-Related Injury   Priority: High  Note:    Current Barriers:  Marland Kitchen Knowledge Deficits related to fall precautions in patient with chronic pain and weakness in legs, causing falls at times  . Decreased adherence to prescribed treatment for fall prevention . Unable to independently manage safety in the home as evidence of fall over the past weekend due to leg weakness and giving away . Does not contact provider office for questions/concerns . Knowledge Deficits related to fall prevention and safety  . Chronic Disease Management support and education needs related to preventing falls in a patient with multiple chronic conditions with recent fall without injury  Clinical Goal(s):  . patient will demonstrate improved adherence to prescribed treatment plan for decreasing falls as evidenced by patient reporting and review of  EMR . patient will verbalize using fall risk reduction strategies discussed . patient will not experience additional falls . patient will verbalize understanding of plan for effective management of falls prevention and safety . patient will work with RNCM, pcp, and CCM team  to address needs related to falls and safety in the patients environment  . patient will attend all scheduled medical appointments: 09-09-2020 at 340 pm . patient will demonstrate improved adherence to prescribed treatment plan for falls prevention and safety  as evidenced byno new falls, being safe in her environment and working with the CCM team and pcp to optimize health and well being.  Interventions:  . Collaboration with Charlynne Cousins, MD regarding development and update of comprehensive plan of care as evidenced by provider attestation and co-signature . Inter-disciplinary care team collaboration (see longitudinal plan of care) . Provided written and verbal education re: Potential causes of falls and Fall prevention strategies . Reviewed medications and discussed potential side effects of medications such as dizziness and frequent urination . Assessed for s/s of orthostatic hypotension . Assessed for falls since last encounter. Fall over the weekend of May 7th . Assessed patients knowledge  of fall risk prevention secondary to previously provided education. . Assessed working status of life alert bracelet and patient adherence . Provided patient information for fall alert systems . Evaluation of current treatment plan related to falls prevention and safety and patient's adherence to plan as established by provider. . Advised patient to call the office for new falls, for concerns, or questions  . Provided education to patient re: being safe in her surroundings and monitoring triggers that cause falls. The patient states that she often falls because her legs give away and cause her to fall. Left leg weakness with last fall  over the weekend.  . Reviewed medications with patient and discussed compliance  . Provided patient with falls prevention and safety educational materials related to preventing falls through the [facility] and my chart system . Reviewed scheduled/upcoming provider appointments including: 09-09-2020 at 340 pm . Discussed plans with patient for ongoing care management follow up and provided patient with direct contact information for care management team Self-Care Deficits:  Unable to independently manage safety as evidence of fall over the weekend, without injury Does not attend all scheduled provider appointments Does not adhere to prescribed medication regimen Does not maintain contact with provider office Does not contact provider office for questions/concerns Patient Goals:  - Utilize safety appropriately with all ambulation- the patient currently does not use any DME for ambulation  - De-clutter walkways - Change positions slowly - Wear secure fitting shoes at all times with ambulation - Utilize home lighting for dim lit areas - Demonstrate self and pet awareness at all times - activities of daily living skills assessed - barriers to physical activity or exercise addressed - barriers to physical activity or exercise identified - barriers to safety identified - cognition assessed - cognitive-stimulating activities promoted - fall prevention plan reviewed and updated - fear of falling, loss of independence and pain acknowledged - medication list reviewed - modification of home and work environment promoted - vision and/or hearing aid use promoted Follow Up Plan: Telephone follow up appointment with care management team member scheduled for: 10-08-2020 at 0900 am   Task: RNCM: Identify and Manage Contributors to Fall Risk   Note:   Care Management Activities:    - activities of daily living skills assessed - barriers to physical activity or exercise addressed - barriers to physical  activity or exercise identified - barriers to safety identified - cognition assessed - cognitive-stimulating activities promoted - fall prevention plan reviewed and updated - fear of falling, loss of independence and pain acknowledged - medication list reviewed - modification of home and work environment promoted - vision and/or hearing aid use promoted       Patient Care Plan: RNCM: Chronic Pain (Adult)- bilateral legs and left side    Problem Identified: RNCM; Pain Management Plan (Chronic Pain) bilateral legs and left side   Priority: High    Goal: RNCM: Pain Management Plan Developed   Priority: High  Note:   Current Barriers:  Marland Kitchen Knowledge Deficits related to managing acute/chronic pain . Non-adherence to scheduled provider appointments . Non-adherence to prescribed medication regimen . Difficulty obtaining medications . Chronic Disease Management support and education needs related to chronic pain . Unable to independently manage pain and discomfort to bilateral legs and left side  . Does not adhere to prescribed medication regimen . Does not contact provider office for questions/concerns Nurse Case Manager Clinical Goal(s):  . patient will verbalize understanding of plan for managing pain . patient will attend all  scheduled medical appointments: 09-09-2020 . patient will demonstrate use of different relaxation  skills and/or diversional activities to assist with pain reduction (distraction, imagery, relaxation, massage, acupressure, TENS, heat, and cold application . patient will report pain at a level less than 3 to 4 on a 10-10 rating scale- reports her pain level at a 7 today . patient will use pharmacological and nonpharmacological pain relief strategies . patient will verbalize acceptable level of pain relief and ability to engage in desired activities . patient will engage in desired activities without an increase in pain level Interventions:  . Collaboration with  Charlynne Cousins, MD regarding development and update of comprehensive plan of care as evidenced by provider attestation and co-signature . Inter-disciplinary care team collaboration (see longitudinal plan of care) . - deep breathing, relaxation and mindfulness use promoted . - effectiveness of pharmacologic therapy monitored . - medication-induced side effects managed . - misuse of pain medication assessed . - motivation and barriers to change assessed and addressed . - mutually acceptable comfort goal set . - pain assessed . - pain treatment goals reviewed . Evaluation of current treatment plan related to pain and patient's adherence to plan as established by provider. 09-09-2020: The  patient called and sent a message through [place] that she wanted to talk to "Nurse Pam".  Call made to the patient and she was concerned about the new thyroid medications that pcp placed her on today and the side effects.  The patient states she is frustrated that her pain is not being addressed. Empathetic listening and support given. Advised the patient to talk to the pcp about her concerns and frustrations. Also advised the patient to take the medication for her thyroid and give it an honest try. The patient verbalized understanding.  . Advised patient to call the office for changes in level or intensity of pain and discomfort, take medications as directed and work with the CCM team to optimize a plan of care for effective management of pain and discomfort.  . Provided education to patient re: taking Ibuprofen as prescribed, discussing her pain concerns with the pcp. The patient states she has tried to talk to the pcp about her pain but feels the pcp is not listening. She says she is having everything done but the pain being addressed. Reflective listening with education about finding out the root issues of her. 09-09-2020: Review with the patient and the patient does not feel her concerns are being addressed.  . Reviewed  medications with patient and discussed the concern of the patient taking Ibuprofen for pain relief and taking 6 pills at one time multiple times a day. The patient states that is the only relief she can get. Has Naproxen but states that does not help her and makes her stomach hurt. Explained the effects of Ibuprofen on the body systems and the need to take as directed. Will discuss with the pcp and pharm D.  . Collaborated with pcp and pharm D regarding  misuse of Ibuprofen. The patient reports she is taking 6 pills at a time 5 to 6 times a day for her chronic pain and discomfort. Education given.  . Discussed plans with patient for ongoing care management follow up and provided patient with direct contact information for care management team . Allow patient to maintain a diary of pain ratings, timing, precipitating events, medications, treatments, and what works best to relieve pain,  . Refer to support groups and self-help groups . Educate patient about the use  of pharmacological interventions for pain management- antianxiety, antidepressants, NSAIDS, opioid analgesics,  . Explain the importance of lifestyle modifications to effective pain management  Patient Goals/Self Care Activities:  . - mutually acceptable comfort goal set . - pain assessed . - pain management plan developed . - pain treatment goals reviewed . - patient response to treatment assessed . - sharing of pain management plan with teachers and other caregivers encouraged . Self-administers medications as prescribed . Attends all scheduled provider appointments . Calls pharmacy for medication refills . Calls provider office for new concerns or questions Follow Up Plan: Telephone follow up appointment with care management team member scheduled for: 11-04-2020 at 0945am    Timeframe:  Short-Term Goal Priority:  High Start Date:              08-26-2020               Expected End Date:   12-16-2020                    Follow Up Date  11-04-2020   - develop a personal pain management plan - plan exercise or activity when pain is best controlled - prioritize tasks for the day - track times pain is worst and when it is best - track what makes the pain worse and what makes it better - use ice or heat for pain relief - work slower and less intense when having pain    Why is this important?    Day-to-day life can be hard when you have chronic pain.   Pain medicine is just one piece of the treatment puzzle.   You can try these action steps to help you manage your pain.        Task: RNCM: Partner to Develop Chronic Pain Management Plan   Note:   Care Management Activities:    - mutually acceptable comfort goal set - pain assessed - pain management plan developed - pain treatment goals reviewed - patient response to treatment assessed - sharing of pain management plan with teachers and other caregivers encouraged       Patient Care Plan: RNCM; Hypertension (Adult)    Problem Identified: RNCM: Hypertension (Hypertension)   Priority: Medium    Long-Range Goal: RNCM; Hypertension Monitored   Priority: Medium  Note:   Objective:  . Last practice recorded BP readings:  BP Readings from Last 3 Encounters:  08/05/20 118/83  08/04/20 126/84  07/30/20 139/85 .   Marland Kitchen Most recent eGFR/CrCl:  Lab Results  Component Value Date   EGFR 64 07/30/2020 .    No components found for: CRCL Current Barriers:  Marland Kitchen Knowledge Deficits related to basic understanding of hypertension pathophysiology and self care management . Knowledge Deficits related to understanding of medications prescribed for management of hypertension . Non-adherence to prescribed medication regimen . Does not adhere to prescribed medication regimen . Does not contact provider office for questions/concerns Case Manager Clinical Goal(s):  . patient will verbalize understanding of plan for hypertension management . patient will attend all scheduled  medical appointments: 09-09-2020 at 340 pm . patient will demonstrate improved adherence to prescribed treatment plan for hypertension as evidenced by taking all medications as prescribed, monitoring and recording blood pressure as directed, adhering to low sodium/DASH diet . patient will demonstrate improved health management independence as evidenced by checking blood pressure as directed and notifying PCP if SBP>160 or DBP > 90, taking all medications as prescribe, and adhering to a low sodium  diet as discussed. . patient will verbalize basic understanding of hypertension disease process and self health management plan as evidenced by compliance with heart healthy diet, compliance with medications, and working with the CCM team to manage health and well being Interventions:  . Collaboration with Charlynne Cousins, MD regarding development and update of comprehensive plan of care as evidenced by provider attestation and co-signature . Inter-disciplinary care team collaboration (see longitudinal plan of care) . Evaluation of current treatment plan related to hypertension self management and patient's adherence to plan as established by provider. Is having swelling in her feet and legs. Scheduled for stress test tomorrow for evaluation. Education on elevation of legs. She does not like to wear anything on her legs, not even pants touching them. Review of ways to help with edema in legs and feet.  . Provided education to patient re: stroke prevention, s/s of heart attack and stroke, DASH diet, complications of uncontrolled blood pressure . Reviewed medications with patient and discussed importance of compliance . Discussed plans with patient for ongoing care management follow up and provided patient with direct contact information for care management team . Advised patient, providing education and rationale, to monitor blood pressure daily and record, calling PCP for findings outside established parameters.   . Reviewed scheduled/upcoming provider appointments including: 09-09-2020 at 340 pm Self-Care Activities: - Self administers medications as prescribed Attends all scheduled provider appointments Calls provider office for new concerns, questions, or BP outside discussed parameters Checks BP and records as discussed Follows a low sodium diet/DASH diet Patient Goals: - check blood pressure weekly - choose a place to take my blood pressure (home, clinic or office, retail store) - write blood pressure results in a log or diary - agree on reward when goals are met - agree to work together to make changes - ask questions to understand - have a family meeting to talk about healthy habits - learn about high blood pressure  - blood pressure trends reviewed - depression screen reviewed - home or ambulatory blood pressure monitoring encouraged Follow Up Plan: Telephone follow up appointment with care management team member scheduled for: 10-08-2020 at 0900 am   Task: RNCM: Identify and Monitor Blood Pressure Elevation   Note:   Care Management Activities:    - blood pressure trends reviewed - depression screen reviewed - home or ambulatory blood pressure monitoring encouraged       Patient Care Plan: RNCM: Asthma (Adult)    Problem Identified: RNCM: Disease Progression (Asthma)   Priority: Medium    Long-Range Goal: Disease Progression Prevented or Minimized   Priority: Medium  Note:   Current Barriers:  Marland Kitchen Knowledge deficits related to basic understanding of asthma disease process . Knowledge deficits related to basic asthma  self care/management . Knowledge deficit related to importance of energy conservation . Unable to independently manage asthma exacerbations  . Does not maintain contact with provider office . Does not contact provider office for questions/concerns  Case Manager Clinical Goal(s):  patient will report utilizing pursed lip breathing for shortness of  breath  patient will verbalize understanding of asthma action plan and when to seek appropriate levels of medical care  patient will engage in lite exercise as tolerated to build/regain stamina and strength and reduce shortness of breath through activity tolerance  patient will verbalize basic understanding of asthma disease process and self care activities  Interventions:  . Collaboration with Charlynne Cousins, MD regarding development and update of comprehensive plan of care as evidenced  by provider attestation and co-signature . Inter-disciplinary care team collaboration (see longitudinal plan of care)  UNABLE to independently: manage asthma during periods of exacerbation   Provided patient with basic written and verbal asthma education on self care/management/and exacerbation prevention. The patient states that she needs a refill for her albuterol as the one she has is outdated. She states she has had asthma for a long time that started in her childhood. She only uses her inhaler when she needs it. Will touch base with pcp and collaborate with patients expressed needs. Explained to the patient that she may need to be evaluated by the pcp before an inhaler could be prescribed. Also advised the patient about smoking causing exacerbation. See care plan for smoking cessation.   Provided patient with asthma action plan and reinforced importance of daily self assessment  Provided instruction about proper use of medications used for management of asthma including inhalers  Advised patient to self assesses asthma  action plan zone and make appointment with provider if in the yellow zone for 48 hours without improvement.  Provided patient with education about the role of exercise in the management of asthma   Advised patient to engage in light exercise as tolerated 3-5 days a week  Provided education about and advised patient to utilize infection prevention strategies to reduce risk of respiratory  infection  Self-Care Activities:  Patient verbalizes understanding of plan to effective management of asthma  Self administers medications as prescribed Attends all scheduled provider appointments Calls pharmacy for medication refills Attends church or other social activities Performs ADL's independently Performs IADL's independently Calls provider office for new concerns or questions Patient Goals: - do breathing exercises every day - do breathing exercises at least 2 times each day - do exercises in a comfortable position that makes breathing as easy as possible - develop a new routine to improve sleep - don't eat or exercise right before bedtime - eat healthy - get at least 7 to 8 hours of sleep at night - get outdoors every day (weather permitting) - keep room cool and dark - limit daytime naps - practice relaxation or meditation daily - use a fan or white noise in bedroom - use devices that will help like a cane, sock-puller or reacher - begin a symptom diary - bring symptom diary to all visits - develop a rescue plan - eliminate symptom triggers at home - follow rescue plan if symptoms flare-up - keep follow-up appointments - use an extra pillow to sleep - avoid second hand smoke - eliminate smoking in my home - identify and avoid work-related triggers - identify and remove indoor air pollutants - limit outdoor activity during cold weather - listen for public air quality announcements every day - activity or exercise based on tolerance encouraged - barriers to medication adherence identified - emotional support provided - medication-adherence assessment completed - screen for functional limitations reviewed - side effects of medication monitored - self-awareness of symptom triggers encouraged - symptom control monitored - symptom log or asthma action plan reviewed - symptom triggers identified Follow Up Plan: Telephone follow up appointment with care management  team member scheduled for: 10-08-2020 at 0900 am   Task: RNCM: Monitor and Manage Adherence to Asthma Therapy   Note:   Care Management Activities:    - activity or exercise based on tolerance encouraged - barriers to medication adherence identified - emotional support provided - medication-adherence assessment completed - screen for functional limitations reviewed - side effects of  medication monitored - self-awareness of symptom triggers encouraged - symptom control monitored - symptom log or asthma action plan reviewed - symptom triggers identified       Problem Identified: RNCM: Smoking cessation   Priority: High    Long-Range Goal: RNCM: Smoking Cessation   Priority: High  Note:   Current Barriers:  . Unable to independently stop smoking.  [place] social connections . Does not contact provider office for questions/concerns . Tobacco abuse of >30 years; currently smoking 1 ppd . Previous quit attempts, unsuccessful several successful using 3 times while she was pregnant and quit because she did not want to harm the baby  . Reports smoking within 30 minutes of waking up . Reports triggers to smoke include: anxiety, not having the answers to her health problems . Reports motivation to quit smoking includes: knows it would be better for her health and well being  . On a scale of 1-10, reports MOTIVATION to quit is 7 . On a scale of 1-10, reports CONFIDENCE in quitting is 7 Clinical Goal(s):  . patient will work with Chief Operating Officer and provider towards tobacco cessation  Interventions: . Collaboration with Charlynne Cousins, MD regarding development and update of comprehensive plan of care as evidenced by provider attestation and co-signature . Inter-disciplinary care team collaboration (see longitudinal plan of care) . Evaluation of current treatment plan reviewed- the patient wants to quit but states she smokes more when her anxiety  level is up. Is interested in medications to help with quitting but states she can not afford. Pharm D referral.  . Provided contact information for [place] [facility] (1-800-QUIT-NOW). Patient will outreach this group for support. . Discussed plans with patient for ongoing care management follow up and provided patient with direct contact information for care management team . Provided patient with printed smoking cessation educational materials . Reviewed scheduled/upcoming provider appointments including: 09-09-2020 . Referred patient to pharmacy team . Provided contact information for [place] [facility] (1-800-QUIT-NOW). Patient will outreach this group for support. . Evaluation of current treatment plan reviewed Patient Goals/Self-Care Activities - diversion activities as a habit during cravings  - verbally commit to reducing tobacco consumption - adherence to treatment plan encouraged - barriers to adherence to treatment plan identified - lifestyle changes encouraged - psychosocial concerns monitored - symptoms of respiratory distress monitored  Follow Up Plan: Telephone follow up appointment with care management team member scheduled for: 10-08-2020 at 0900 am   Task: RNCM: Smoking cessation   Note:   Care Management Activities:    - adherence to treatment plan encouraged - barriers to adherence to treatment plan identified - lifestyle changes encouraged - psychosocial concerns monitored - symptoms of respiratory distress monitored         Patient verbalizes understanding of instructions provided today and agrees to view in [place].   Telephone follow up appointment with care management team member scheduled for: 11-04-2020 and 0945 am  [place], MSN, [facility] Mobile: [phone]

## 2020-09-09 NOTE — Progress Notes (Signed)
There were no vitals taken for this visit.   Subjective:    Patient ID: Jodi Keith, female    DOB: 01-05-1966, 55 y.o.   MRN: [id number]  HPI: Jodi Keith is a 55 y.o. female  I connected with [name] on 09/09/20 by a video enabled telemedicine application and verified that I am speaking with the correct person using two identifiers.  I discussed the limitations of evaluation and management by telemedicine. The patient expressed understanding and agreed to proceed. "I discussed the limitations of evaluation and management by telemedicine and the availability of in person appointments. The patient expressed understanding and agreed to proceed"     This visit was completed via MyChart due to the restrictions of the COVID-19 pandemic. All issues as above were discussed and addressed. Physical exam was done as above through visual confirmation on MyChart. If it was felt that the patient should be evaluated in the office, they were directed there. The patient verbally consented to this visit. Location of the patient: home Location of the provider: work Those involved with this call:  Provider: [name], MD CMA: Tristan Schroeder, CMA Front Desk/Registration: Harriet Pho  Time spent on call: 10 minutes with patient face to face via video conference. More than 50% of this time was spent in counseling and coordination of care. 10 minutes total spent in review of patient's record and preparation of their chart.    Congestive Heart Failure Presents for follow-up visit. Associated symptoms include edema. Pertinent negatives include no abdominal pain, fatigue, near-syncope, nocturia, orthopnea, palpitations or shortness of breath.  Thyroid Problem Presents for follow-up (pt has an abnl thyroid nodule per [place] ) visit. Patient reports no fatigue, leg swelling, menstrual problem, nail problem, palpitations or tremors.    Chief Complaint  Patient presents with  .  Congestive Heart Failure    Relevant past medical, surgical, family and social history reviewed and updated as indicated. Interim medical history since our last visit reviewed. Allergies and medications reviewed and updated.  Review of Systems  Constitutional: Negative for fatigue.  Respiratory: Negative for shortness of breath.   Cardiovascular: Negative for palpitations and near-syncope.  Gastrointestinal: Negative for abdominal pain.  Genitourinary: Negative for menstrual problem and nocturia.  Neurological: Negative for tremors.    Per HPI unless specifically indicated above     Objective:    There were no vitals taken for this visit.  Wt Readings from Last 3 Encounters:  08/05/20 235 lb 9.6 oz (106.9 kg)  08/04/20 237 lb (107.5 kg)  07/30/20 235 lb (106.6 kg)    Physical Exam Constitutional:      General: She is not in acute distress.    Appearance: Normal appearance. She is not ill-appearing or diaphoretic.  Eyes:     Conjunctiva/sclera: Conjunctivae normal.     Pupils: Pupils are equal, round, and reactive to light.  Pulmonary:     Breath sounds: No wheezing or rhonchi.  Chest:     Chest wall: No tenderness.  Neurological:     Mental Status: She is alert.  Psychiatric:        Mood and Affect: Mood normal.        Behavior: Behavior normal.        Thought Content: Thought content normal.        Judgment: Judgment normal.     Results for orders placed or performed during the hospital encounter of 08/15/20  ECHOCARDIOGRAM COMPLETE  Result Value Ref Range  Ao pk vel 1.29 m/s   AV Area VTI 3.04 cm2   AR max vel 2.42 cm2   AV Mean grad 4.0 mmHg   AV Peak grad 6.7 mmHg   S' Lateral 2.36 cm   AV Area mean vel 2.64 cm2   Area-P 1/2 5.58 cm2        Current Outpatient Medications:  .  amLODipine (NORVASC) 10 MG tablet, Take 1 tablet by mouth daily., Disp: , Rfl:  .  famotidine (PEPCID) 20 MG tablet, Take 1 tablet (20 mg total) by mouth at bedtime., Disp: 30  tablet, Rfl: 3 .  ibuprofen (ADVIL) 200 MG tablet, Take 200 mg by mouth every 4 (four) hours as needed (patient states that she takes 6 at a time 5 or 6 times a day). Education- will do pharmacy referral, Disp: , Rfl:  .  metoprolol tartrate (LOPRESSOR) 100 MG tablet, Take 100 mg by mouth daily., Disp: , Rfl:  .  naproxen (NAPROSYN) 500 MG tablet, Take 1 tablet (500 mg total) by mouth 2 (two) times daily as needed., Disp: 30 tablet, Rfl: 2 .  nystatin cream (MYCOSTATIN), Apply 1 application topically 2 (two) times daily., Disp: 30 g, Rfl: 1 .  spironolactone (ALDACTONE) 25 MG tablet, Take 0.5 tablets by mouth daily., Disp: , Rfl:  .  hydrochlorothiazide (MICROZIDE) 12.5 MG capsule, Take 1 capsule (12.5 mg total) by mouth daily. (Patient not taking: Reported on 09/09/2020), Disp: 30 capsule, Rfl: 3 .  losartan (COZAAR) 50 MG tablet, Take 1 tablet (50 mg total) by mouth daily., Disp: 30 tablet, Rfl: 3     Assessment & Plan:   Thyroid nodule :  There is an approximately 0.3 cm partially shadowing dystrophic calcification involving the superior pole of the right lobe of the thyroid which is without associated nodule.  IMPRESSION: 1. Atrophic and moderately heterogeneous appearing thyroid, [name], Keith (MRN [id number]) as of 09/09/2020 09:29  Ref. Range 07/30/2020 10:32 07/30/2020 10:34 08/05/2020 09:32  TSH Latest Ref Range: 0.450 - 4.500 uIU/mL  7.720 (H) 5.480 (H)  T4,[name], [name]   HLD LDL - goal 70, 117 today  Will start pt on lipitor 20 mg qpm. recheck FLP,  check LFT's work on diet, SE of meds explained to pt. low fat and high fiber diet explained to pt.   Problem List Items Addressed This Visit   None      Follow up plan: No follow-ups on file.

## 2020-09-16 ENCOUNTER — Ambulatory Visit: Payer: Self-pay

## 2020-09-16 NOTE — Telephone Encounter (Signed)
Pt has apt for [employer] on 09/17/2020

## 2020-09-16 NOTE — Telephone Encounter (Signed)
Pt. Reports she has a history of asthma "for a long time." "When the weather gets hot it gets worse and now it is worse at night." Requesting refill of Albuterol inhaler. Has one that she has had from a previous provider.Virtual visit made for tomorrow. Reason for Disposition . [1] MODERATE longstanding difficulty breathing (e.g., speaks in phrases, SOB even at rest, pulse 100-120) AND [2] SAME as normal  Answer Assessment - Initial Assessment Questions 1. RESPIRATORY STATUS: "Describe your breathing?" (e.g., wheezing, shortness of breath, unable to speak, severe coughing)      Shortness of breath 2. ONSET: "When did this breathing problem begin?"      2 weeks ago 3. PATTERN "Does the difficult breathing come and go, or has it been constant since it started?"      Comes and goes 4. SEVERITY: "How bad is your breathing?" (e.g., mild, moderate, severe)    - MILD: No SOB at rest, mild SOB with walking, speaks normally in sentences, can lie down, no retractions, pulse < 100.    - MODERATE: SOB at rest, SOB with minimal exertion and prefers to sit, cannot lie down flat, speaks in phrases, mild retractions, audible wheezing, pulse 100-120.    - SEVERE: Very SOB at rest, speaks in single words, struggling to breathe, sitting hunched forward, retractions, pulse > 120      Worse at night 5. RECURRENT SYMPTOM: "Have you had difficulty breathing before?" If Yes, ask: "When was the last time?" and "What happened that time?"      Yes 6. CARDIAC HISTORY: "Do you have any history of heart disease?" (e.g., heart attack, angina, bypass surgery, angioplasty)      Yes 7. LUNG HISTORY: "Do you have any history of lung disease?"  (e.g., pulmonary embolus, asthma, emphysema)     Asthma 8. CAUSE: "What do you think is causing the breathing problem?"      Asthma 9. OTHER SYMPTOMS: "Do you have any other symptoms? (e.g., dizziness, runny nose, cough, chest pain, fever)     Wheezing 10. O2 SATURATION MONITOR:  "Do  you use an oxygen saturation monitor (pulse oximeter) at home?" If Yes, "What is your reading (oxygen level) today?" "What is your usual oxygen saturation reading?" (e.g., 95%)       No 11. PREGNANCY: "Is there any chance you are pregnant?" "When was your last menstrual period?"       No 12. TRAVEL: "Have you traveled out of the country in the last month?" (e.g., travel history, exposures)       No  Protocols used: BREATHING DIFFICULTY-A-AH

## 2020-09-16 NOTE — Progress Notes (Signed)
BP (!) 138/102   Wt 235 lb (106.6 kg)   LMP  (LMP Unknown)   BMI 41.90 kg/m    Subjective:    Patient ID: Jodi Keith, female    DOB: April 09, 1966, 55 y.o.   MRN: [id number]  HPI: Jodi Keith is a 55 y.o. female  Chief Complaint  Patient presents with  . Asthma    Patient states that her asthma has flared up since the weather has gotten warm   ASTHMA Asthma status: uncontrolled Same symptoms occur every summer Satisfied with current treatment?: yes Albuterol/rescue inhaler frequency: she is out Dyspnea frequency: couple of times a day Wheezing frequency: couple of times a day Cough frequency: couple of times a day- symptoms worsen when she is outside.  Improved inside. Nocturnal symptom frequency: nightly Limitation of activity: yes Current upper respiratory symptoms: no Triggers: heat Home peak flows: Last Spirometry:  Failed/intolerant to following asthma meds:  Asthma meds in past: albuterol Aerochamber/spacer use: no Visits to ER or Urgent Care in past year: no Pneumovax: Up to Date Influenza: Not up to Date  Relevant past medical, surgical, family and social history reviewed and updated as indicated. Interim medical history since our last visit reviewed. Allergies and medications reviewed and updated.  Review of Systems  Respiratory: Positive for cough, shortness of breath and wheezing.     Per HPI unless specifically indicated above     Objective:    BP (!) 138/102   Wt 235 lb (106.6 kg)   LMP  (LMP Unknown)   BMI 41.90 kg/m   Wt Readings from Last 3 Encounters:  09/17/20 235 lb (106.6 kg)  08/05/20 235 lb 9.6 oz (106.9 kg)  08/04/20 237 lb (107.5 kg)    Physical Exam Vitals and nursing note reviewed.  Pulmonary:     Effort: Pulmonary effort is normal. No respiratory distress.  Neurological:     Mental Status: She is alert.  Psychiatric:        Mood and Affect: Mood normal.        Behavior: Behavior normal.        Thought  Content: Thought content normal.        Judgment: Judgment normal.     Results for orders placed or performed during the hospital encounter of 08/15/20  ECHOCARDIOGRAM COMPLETE  Result Value Ref Range   Ao pk vel 1.29 m/s   AV Area VTI 3.04 cm2   AR max vel 2.42 cm2   AV Mean grad 4.0 mmHg   AV Peak grad 6.7 mmHg   S' Lateral 2.36 cm   AV Area mean vel 2.64 cm2   Area-P 1/2 5.58 cm2      Assessment & Plan:   Problem List Items Addressed This Visit   None   Visit Diagnoses    Moderate asthma with exacerbation, unspecified whether persistent    -  Primary   Relevant Medications   albuterol (VENTOLIN HFA) 108 (90 Base) MCG/ACT inhaler       Follow up plan: Return in about 1 week (around 09/24/2020) for spirometry and breathing check.    This visit was completed via MyChart due to the restrictions of the COVID-19 pandemic. All issues as above were discussed and addressed. Physical exam was done as above through visual confirmation on MyChart. If it was felt that the patient should be evaluated in the office, they were directed there. The patient verbally consented to this visit. 1. Location of the patient: Home 2.  Location of the provider: Office 3. Those involved with this call:  ? Provider: Larae Grooms, NP ? CMA: Tiffany Reel, CMA ? Front Desk/Registration: Harriet Pho 4. Time spent on call: 20 minutes with patient face to face via video conference. More than 50% of this time was spent in counseling and coordination of care. 30 minutes total spent in review of patient's record and preparation of their chart.

## 2020-09-17 ENCOUNTER — Ambulatory Visit (INDEPENDENT_AMBULATORY_CARE_PROVIDER_SITE_OTHER): Payer: 59 | Admitting: Nurse Practitioner

## 2020-09-17 ENCOUNTER — Encounter: Payer: Self-pay | Admitting: Nurse Practitioner

## 2020-09-17 VITALS — BP 138/102 | Wt 235.0 lb

## 2020-09-17 DIAGNOSIS — J45901 Unspecified asthma with (acute) exacerbation: Secondary | ICD-10-CM

## 2020-09-17 MED ORDER — ALBUTEROL SULFATE HFA 108 (90 BASE) MCG/ACT IN AERS: 2.0000 | INHALATION_SPRAY | Freq: Four times a day (QID) | RESPIRATORY_TRACT | 2 refills | Status: DC | PRN

## 2020-09-17 NOTE — Assessment & Plan Note (Signed)
Chronic.  Not well controlled.  Patient is having trouble breathing whenever she goes outside. She is a current everyday smoker. Does not currently have albuterol. Has never been on maintenance inhaler. Recommend she come into the office next week to have Spirometry completed.  Patient will likely benefit from daily inhaler.

## 2020-09-18 ENCOUNTER — Telehealth: Payer: Self-pay | Admitting: Pharmacist

## 2020-09-18 NOTE — Chronic Care Management (AMB) (Signed)
Chronic Care Management Pharmacy Assistant   Name: Adamaris King  MRN: [id number] DOB: 01-30-1966  Jodi Keith is an 55 y.o. year old female who presents for her initial CCM visit with the clinical pharmacist.  Reason for Encounter: Initial CCM call   Recent office visits:  09/17/2020 Larae Grooms, NP. Seen for Asthma. Started on albuterol (VENTOLIN HFA) 108 (90 Base) MCG/ACT inhaler. Follow up in 1 week for spirometry and breathing check. 09/09/2020 [name], MD (PCP, Video Visit) Seen for Congestive heart failure and Thyroid problem. Start pt on lipitor 20 mg qpm. Follow up in 4 weeks. 08/05/2020 [name], MD (PCP) Congestion heart failure and thyroid problem. Start on famotidine. Given Pneumonia vaccine. 07/30/2020 Evalee Jefferson, MD (PCP) New patient establishing care. Start pt on bactrim, naproxen, HCTZ. Referring pt to Cardiology for ECHO/stress test. Follow up in 1 week.  Recent consult visits:  08/12/2020 Gala Lewandowsky, DPM (Podiatry) Consultation. Recommend OTC salicylic acid daily for plantar wart lesions. Follow up in 4 weeks. 08/04/2020 [name], MD (OBGYN) Ovarian Cyst follow up. Follow up in 3 months.  Hospital visits:  Medication Reconciliation was completed by comparing discharge summary, patient's EMR and Pharmacy list, and upon discussion with patient.  Admitted to the hospital on 07/21/2020 due to Right ovarian cyst. Discharge date was 07/21/2020. Discharged from [facility].    New?Medications Started at [facility] Discharge:?? -None noted Medication Changes at Hospital Discharge: -None noted  Medications Discontinued at Hospital Discharge: -None noted  Medications that remain the same after Hospital Discharge:??  -All other medications will remain the same.    Medications: Outpatient Encounter Medications as of 09/18/2020  Medication Sig Note  . albuterol (VENTOLIN HFA) 108 (90 Base) MCG/ACT inhaler Inhale 2 puffs into  the lungs every 6 (six) hours as needed for wheezing or shortness of breath.   Marland Kitchen amLODipine (NORVASC) 10 MG tablet Take 1 tablet by mouth daily.   Marland Kitchen atorvastatin (LIPITOR) 20 MG tablet Take 1 tablet (20 mg total) by mouth daily.   . famotidine (PEPCID) 20 MG tablet Take 1 tablet (20 mg total) by mouth at bedtime.   . hydrochlorothiazide (MICROZIDE) 12.5 MG capsule Take 1 capsule (12.5 mg total) by mouth daily. (Patient not taking: Reported on 09/09/2020)   . ibuprofen (ADVIL) 200 MG tablet Take 200 mg by mouth every 4 (four) hours as needed (patient states that she takes 6 at a time 5 or 6 times a day). Education- will do pharmacy referral   . levothyroxine (SYNTHROID) 25 MCG tablet Take 1 tablet (25 mcg total) by mouth daily.   Marland Kitchen losartan (COZAAR) 50 MG tablet Take 1 tablet (50 mg total) by mouth daily.   . metoprolol tartrate (LOPRESSOR) 100 MG tablet Take 100 mg by mouth daily.   . naproxen (NAPROSYN) 500 MG tablet Take 1 tablet (500 mg total) by mouth 2 (two) times daily as needed. 08/05/2020: Makes patient sick to her stomach  . nystatin cream (MYCOSTATIN) Apply 1 application topically 2 (two) times daily.   Marland Kitchen spironolactone (ALDACTONE) 25 MG tablet Take 0.5 tablets by mouth daily.    No facility-administered encounter medications on file as of 09/18/2020.    Albuterol (VENTOLIN HFA) 108 (90 Base) MCG/ACT inhaler Last filled:  AmLODipine (NORVASC) 10 MG tablet Last filled:08/19/2020 30 DS  Atorvastatin (LIPITOR) 20 MG tablet Last filled:09/09/2020 30 DS  famotidine (PEPCID) 20 MG tablet Last filled: 08/05/2020 30 DS  hydrochlorothiazide (MICROZIDE) 12.5 MG capsule Last filled:07/30/2020  30 D  ibuprofen (ADVIL) 200 MG tablet Last filled:None noted  levothyroxine (SYNTHROID) 25 MCG tablet Last filled: 09/09/2020 30 DS  losartan (COZAAR) 50 MG tablet (Expired) Last filled: 08/05/2020 30 DS  metoprolol tartrate (LOPRESSOR) 100 MG tablet Last filled: 09/01/2020 3         DS  naproxen (NAPROSYN) 500 MG tablet Last filled: 07/30/2020 15 DS  nystatin cream (MYCOSTATIN) Last filled: 07/30/2020 15 DS  spironolactone (ALDACTONE) 25 MG tablet Last filled:08/16/2020 60 DS   Have you seen any other providers since your last visit?  Patient states she has not seen other providers since her last visit.  Any changes in your medications or health?  Patient states she had started on Albuterol Inhaler   Any side effects from any medications? Patient states she has not had any side effect from any of her medications.  Do you have an symptoms or problems not managed by your medications?  Patient states she does not have any symptoms or problems not manages by her medications.  Any concerns about your health right now?  Patient states she is fighting currently a kidney infection in last 2 days is currently taking AZO for it and having body cramps for about 3-4 weeks. I advised patient to call her PCP to get evaluated.  Has your provider asked that you check blood pressure, blood sugar, or follow special diet at home? Patient states yes with her Blood pressure she checks it at least twice daily.  Do you get any type of exercise on a regular basis?  Patient states she tried to walk as much as she can.  Can you think of a goal you would like to reach for your health? Patient states that her goal is to just to be healthy so she can run & play with her grandchildren.  Do you have any problems getting your medications?  Patient states she does not have any problems getting her medications.  Is there anything that you would like to discuss during the appointment?  Patient states she is fighting currently a kidney infection in last 2 days is currently taking AZO for it and having body cramps for about 3-4 weeks. I advised patient to call her PCP to get evaluated.  Star Rating Drugs: Atorvastatin (LIPITOR) 20 MG tablet Last filled:09/09/2020 30 DS losartan (COZAAR) 50  MG tablet (Expired) Last filled: 08/05/2020 30 DS  Myriam Carolin Coy, RMA [facility] [phone]

## 2020-09-22 ENCOUNTER — Ambulatory Visit: Payer: 59 | Admitting: Pharmacist

## 2020-09-22 NOTE — Progress Notes (Incomplete)
Chronic Care Management Pharmacy Note  09/22/2020 Name:  Jodi Keith MRN:  [id number] DOB:  1966-01-31    Subjective: Jodi Keith is an 55 y.o. year old female who is a primary patient of Vigg, Avanti, MD.  The CCM team was consulted for assistance with disease management and care coordination needs.    Engaged with patient by telephone for initial visit in response to provider referral for pharmacy case management and/or care coordination services.   Consent to Services:  The patient was given the following information about Chronic Care Management services today, agreed to services, and gave verbal consent: 1. CCM service includes personalized support from designated clinical staff supervised by the primary care provider, including individualized plan of care and coordination with other care providers 2. 24/7 contact phone numbers for assistance for urgent and routine care needs. 3. Service will only be billed when office clinical staff spend 20 minutes or more in a month to coordinate care. 4. Only one practitioner may furnish and bill the service in a calendar month. 5.The patient may stop CCM services at any time (effective at the end of the month) by phone call to the office staff. 6. The patient will be responsible for cost sharing (co-pay) of up to 20% of the service fee (after annual deductible is met). Patient agreed to services and consent obtained.  Patient Care Team: Charlynne Cousins, MD as PCP - General (Internal Medicine) Vanita Ingles, RN as Registered Nurse (General Practice) Rebekah Chesterfield, LCSW as Social Worker (Licensed Clinical Social Worker) Vladimir Faster, [facility] (Pharmacist)  Recent office visits: 09/17/20- Mathis Dad- albuterol 2 puffs q6hp  Recent consult visits: 4/29/22[facility] visits: None in previous 6 months   Objective:  Lab Results  Component Value Date   CREATININE 1.04 (H) 07/30/2020   BUN 11 07/30/2020   NA 139 07/30/2020   K  4.3 07/30/2020   CALCIUM 9.5 07/30/2020   CO2 21 07/30/2020   GLUCOSE 90 07/30/2020    Lab Results  Component Value Date/Time   HGBA1C 5.9 07/30/2020 10:32 AM    Last diabetic Eye exam: No results found for: HMDIABEYEEXA  Last diabetic Foot exam: No results found for: HMDIABFOOTEX   Lab Results  Component Value Date   CHOL 183 07/30/2020   HDL 39 (L) 07/30/2020   LDLCALC 115 (H) 07/30/2020   TRIG 162 (H) 07/30/2020   CHOLHDL 4.7 (H) 07/30/2020    Hepatic Function Latest Ref Rng & Units 07/30/2020  Total Protein 6.0 - 8.5 g/dL 7.5  Albumin 3.8 - 4.9 g/dL 4.3  AST 0 - 40 IU/L 20  ALT 0 - 32 IU/L 23  Alk Phosphatase 44 - 121 IU/L 149(H)  Total Bilirubin 0.0 - 1.2 mg/dL 0.3    Lab Results  Component Value Date/Time   TSH 5.480 (H) 08/05/2020 09:32 AM   TSH 7.720 (H) 07/30/2020 10:34 AM   FREET4 1.20 08/05/2020 09:32 AM    CBC Latest Ref Rng & Units 07/30/2020  WBC 3.4 - 10.8 x10E3/uL 7.5  Hemoglobin 11.1 - 15.9 g/dL 16.3(H)  Hematocrit 34.0 - 46.6 % 48.7(H)  Platelets 150 - 450 x10E3/uL 335    No results found for: VD25OH  Clinical ASCVD: No  The 10-year ASCVD risk score Mikey Bussing DC Jr., et al., 2013) is: 8.5%   Values used to calculate the score:     Age: 75 years     Sex: Female     Is Non-Hispanic African American:  No     Diabetic: No     Tobacco smoker: Yes     Systolic Blood Pressure: 138 mmHg     Is BP treated: Yes     HDL Cholesterol: 39 mg/dL     Total Cholesterol: 183 mg/dL    Depression screen [facility] 2/9 09/09/2020 08/26/2020 08/05/2020  Decreased Interest 0 0 0  Down, Depressed, Hopeless 1 0 0  PHQ - 2 Score 1 0 0  Altered sleeping 1 3 -  Tired, decreased energy 0 3 -  Change in appetite 0 1 -  Feeling bad or failure about yourself  0 0 -  Trouble concentrating 0 0 -  Moving slowly or fidgety/restless 0 0 -  Suicidal thoughts - 0 -  PHQ-9 Score 2 7 -    GAD 7 : Generalized Anxiety Score 08/05/2020 07/30/2020  Nervous, Anxious, on Edge 3 3   Control/stop worrying 3 3  Worry too much - different things 3 3  Trouble relaxing 2 3  Restless 2 2  Easily annoyed or irritable 3 3  Afraid - awful might happen 3 3  Total GAD 7 Score 19 20    Social History   Tobacco Use  Smoking Status Current Every Day Smoker   Packs/day: 1.00  Smokeless Tobacco Never Used   BP Readings from Last 3 Encounters:  09/17/20 (!) 138/102  08/05/20 118/83  08/04/20 126/84   Pulse Readings from Last 3 Encounters:  08/05/20 91  07/30/20 82   Wt Readings from Last 3 Encounters:  09/17/20 235 lb (106.6 kg)  08/05/20 235 lb 9.6 oz (106.9 kg)  08/04/20 237 lb (107.5 kg)   BMI Readings from Last 3 Encounters:  09/17/20 41.90 kg/m  08/05/20 42.01 kg/m  08/04/20 42.36 kg/m    Assessment/Interventions: Review of patient past medical history, allergies, medications, health status, including review of consultants reports, laboratory and other test data, was performed as part of comprehensive evaluation and provision of chronic care management services.   SDOH:  (Social Determinants of Health) assessments and interventions performed: {yes/no:20286}  SDOH Screenings   Alcohol Screen: Low Risk    Last Alcohol Screening Score (AUDIT): 0  Depression (PHQ2-9): Low Risk    PHQ-2 Score: 2  Financial Resource Strain: Low Risk    Difficulty of Paying Living Expenses: Not very hard  Food Insecurity: No Food Insecurity   Worried About Programme researcher, broadcasting/film/video in the Last Year: Never true   Ran Out of Food in the Last Year: Never true  Housing: Low Risk    Last Housing Risk Score: 0  Physical Activity: Inactive   Days of Exercise per Week: 0 days   Minutes of Exercise per Session: 0 min  Social Connections: Moderately Isolated   Frequency of Communication with Friends and Family: More than three times a week   Frequency of Social Gatherings with Friends and Family: More than three times a week   Attends Religious Services: Never   Doctor, general practice or Organizations: No   Attends Banker Meetings: Never   Marital Status: Married  Stress: Stress Concern Present   Feeling of Stress : To some extent  Tobacco Use: High Risk   Smoking Tobacco Use: Current Every Day Smoker   Smokeless Tobacco Use: Never Used  Transportation Needs: No Transportation Needs   Lack of Transportation (Medical): No   Lack of Transportation (Non-Medical): No      Immunization History  Administered Date(s) Administered  Influenza,inj,Quad PF,6+ Mos 05/03/2018   PFIZER(Purple Top)SARS-COV-2 Vaccination 11/19/2019, 12/20/2019   Pneumococcal Polysaccharide-23 08/05/2020    Conditions to be addressed/monitored:  {USCCMDZASSESSMENTOPTIONS:23563}  There are no care plans that you recently modified to display for this patient.    Medication Assistance: {MEDASSISTANCEINFO:25044}  Compliance/Adherence/Medication fill history: Care Gaps: ***  Star-Rating Drugs: ***  Patient's preferred pharmacy is:  [facility], [place] - [address] [place] [address] Phone: [phone] Fax: [phone]  Uses pill box? {Yes or If no, why not?:20788} Pt endorses ***% compliance  We discussed: {Pharmacy options:24294} Patient decided to: {US Pharmacy Plan:23885}  Care Plan and Follow Up Patient Decision:  {FOLLOWUP:24991}  Plan: {CM FOLLOW UP PLAN:25073}  ***

## 2020-09-26 ENCOUNTER — Ambulatory Visit: Payer: 59 | Admitting: Podiatry

## 2020-09-30 ENCOUNTER — Other Ambulatory Visit: Payer: Self-pay

## 2020-09-30 ENCOUNTER — Encounter: Payer: Self-pay | Admitting: Internal Medicine

## 2020-09-30 ENCOUNTER — Ambulatory Visit (INDEPENDENT_AMBULATORY_CARE_PROVIDER_SITE_OTHER): Payer: 59 | Admitting: Internal Medicine

## 2020-09-30 VITALS — BP 119/80 | HR 76 | Temp 98.1°F | Ht 62.8 in | Wt 235.0 lb

## 2020-09-30 DIAGNOSIS — F17200 Nicotine dependence, unspecified, uncomplicated: Secondary | ICD-10-CM

## 2020-09-30 DIAGNOSIS — J45909 Unspecified asthma, uncomplicated: Secondary | ICD-10-CM | POA: Diagnosis not present

## 2020-09-30 DIAGNOSIS — N390 Urinary tract infection, site not specified: Secondary | ICD-10-CM

## 2020-09-30 DIAGNOSIS — J45901 Unspecified asthma with (acute) exacerbation: Secondary | ICD-10-CM

## 2020-09-30 DIAGNOSIS — E039 Hypothyroidism, unspecified: Secondary | ICD-10-CM | POA: Insufficient documentation

## 2020-09-30 DIAGNOSIS — R1012 Left upper quadrant pain: Secondary | ICD-10-CM

## 2020-09-30 DIAGNOSIS — R0602 Shortness of breath: Secondary | ICD-10-CM | POA: Insufficient documentation

## 2020-09-30 DIAGNOSIS — E785 Hyperlipidemia, unspecified: Secondary | ICD-10-CM

## 2020-09-30 DIAGNOSIS — D751 Secondary polycythemia: Secondary | ICD-10-CM

## 2020-09-30 LAB — URINALYSIS, ROUTINE W REFLEX MICROSCOPIC
Bilirubin, UA: NEGATIVE
Glucose, UA: NEGATIVE
Ketones, UA: NEGATIVE
Leukocytes,UA: NEGATIVE
Nitrite, UA: NEGATIVE
Protein,UA: NEGATIVE
RBC, UA: NEGATIVE
Specific Gravity, UA: 1.015 (ref 1.005–1.030)
Urobilinogen, Ur: 0.2 mg/dL (ref 0.2–1.0)
pH, UA: 6.5 (ref 5.0–7.5)

## 2020-09-30 MED ORDER — TRAMADOL HCL 50 MG PO TABS: 50.0000 mg | ORAL_TABLET | Freq: Two times a day (BID) | ORAL | 0 refills | Status: AC | PRN

## 2020-09-30 MED ORDER — OZEMPIC (0.25 OR 0.5 MG/DOSE) 2 MG/1.5ML ~~LOC~~ SOPN: 0.5000 mg | PEN_INJECTOR | SUBCUTANEOUS | 6 refills | Status: DC

## 2020-09-30 MED ORDER — NICOTINE 21 MG/24HR TD PT24: 21.0000 mg | MEDICATED_PATCH | Freq: Every day | TRANSDERMAL | 0 refills | Status: DC

## 2020-09-30 NOTE — Progress Notes (Signed)
BP 119/80   Pulse 76   Temp 98.1 F (36.7 C) (Oral)   Ht 5' 2.8" (1.595 m)   Wt 235 lb (106.6 kg)   LMP  (LMP Unknown)   SpO2 96%   BMI 41.89 kg/m    Subjective:    Patient ID: Jodi Keith, female    DOB: 11-13-1965, 55 y.o.   MRN: [id number]  Chief Complaint  Patient presents with   Flank Pain    Left side pain is ongoing   edema    B/L legs, getting better per patient    HPI: Jodi Keith is a 55 y.o. female  Pt is here for abdominal pain / She was here last week for sob/ is a current smoker was asked to come back for spirometry for further eval. Cleda Daub shows restrictive lung dz, unlike copd. Pt conitnues to co LUQ for which sh ewas in the ER and had presented to me in such a manner as a referal from them to establish care. She has had a -e CT scan then, has had [place] which was -ve too. She is in pain however and is very tearful today.    Abdominal Pain This is a recurrent problem. The current episode started more than 1 month ago. The onset quality is gradual.   Chief Complaint  Patient presents with   Flank Pain    Left side pain is ongoing   edema    B/L legs, getting better per patient    Relevant past medical, surgical, family and social history reviewed and updated as indicated. Interim medical history since our last visit reviewed. Allergies and medications reviewed and updated.  Review of Systems  Gastrointestinal:  Positive for abdominal pain.   Per HPI unless specifically indicated above     Objective:    BP 119/80   Pulse 76   Temp 98.1 F (36.7 C) (Oral)   Ht 5' 2.8" (1.595 m)   Wt 235 lb (106.6 kg)   LMP  (LMP Unknown)   SpO2 96%   BMI 41.89 kg/m   Wt Readings from Last 3 Encounters:  09/30/20 235 lb (106.6 kg)  09/17/20 235 lb (106.6 kg)  08/05/20 235 lb 9.6 oz (106.9 kg)    Physical Exam  Results for orders placed or performed during the hospital encounter of 08/15/20  ECHOCARDIOGRAM COMPLETE  Result Value Ref Range    Ao pk vel 1.29 m/s   AV Area VTI 3.04 cm2   AR max vel 2.42 cm2   AV Mean grad 4.0 mmHg   AV Peak grad 6.7 mmHg   S' Lateral 2.36 cm   AV Area mean vel 2.64 cm2   Area-P 1/2 5.58 cm2        Current Outpatient Medications:    albuterol (VENTOLIN HFA) 108 (90 Base) MCG/ACT inhaler, Inhale 2 puffs into the lungs every 6 (six) hours as needed for wheezing or shortness of breath., Disp: 16 g, Rfl: 2   amLODipine (NORVASC) 10 MG tablet, Take 1 tablet by mouth daily., Disp: , Rfl:    atorvastatin (LIPITOR) 20 MG tablet, Take 1 tablet (20 mg total) by mouth daily., Disp: 30 tablet, Rfl: 3   famotidine (PEPCID) 20 MG tablet, Take 1 tablet (20 mg total) by mouth at bedtime., Disp: 30 tablet, Rfl: 3   hydrochlorothiazide (MICROZIDE) 12.5 MG capsule, Take 1 capsule (12.5 mg total) by mouth daily., Disp: 30 capsule, Rfl: 3   levothyroxine (SYNTHROID) 25 MCG tablet, Take  1 tablet (25 mcg total) by mouth daily., Disp: 30 tablet, Rfl: 3   naproxen (NAPROSYN) 500 MG tablet, Take 1 tablet (500 mg total) by mouth 2 (two) times daily as needed., Disp: 30 tablet, Rfl: 2   nystatin cream (MYCOSTATIN), Apply 1 application topically 2 (two) times daily., Disp: 30 g, Rfl: 1   spironolactone (ALDACTONE) 25 MG tablet, Take 0.5 tablets by mouth daily., Disp: , Rfl:    Homeopathic Products (RESTFUL LEGS SL), Place 1 tablet under the tongue at bedtime., Disp: , Rfl:    ibuprofen (ADVIL) 200 MG tablet, Take 200 mg by mouth every 4 (four) hours as needed (patient states that she takes 6 at a time 5 or 6 times a day). Education- will do pharmacy referral, Disp: , Rfl:    losartan (COZAAR) 50 MG tablet, Take 1 tablet (50 mg total) by mouth daily., Disp: 30 tablet, Rfl: 3    Assessment & Plan:  SOB ? Sec to COPD vs asthma  Will check PFTs Spirometry shows  ? Restrictive lung dz ? Sec to obesity  Add spiriva.  2. Current smoker : Smokes - 1 ppd otherwise 2 ppd  Smoking cessation advise nicotine patches in the past.  continues to smoke. more than > 5 - 10 mins of time was spent with pt regarding smoking cessation and complications.   3. Obesity : Bmi 41 : will start pt on ozempic 0/25   4. Abdominal pain : LUQ, -ve Cardiac wu had a -ve ECHO which only shows diastolic dysfunction has had a CT cardiac protocol sec to not being able to perfom a stress test to fu with cards for such  Alkaline phosp high but pt is  S/P CHOLECYSTECtomy x 10 yrs ago? If sludge causing such.  Will check CT ABDOMEN / PELVIS  Had a Ct abdomen recently in April, continues to hurt.  Will refer to GI asap as well. ? Cuause of her LUQ pain   ? Mskulosk[place]al causes of pain     Problem List Items Addressed This Visit       Respiratory   Moderate asthma with exacerbation   Relevant Medications   Tiotropium Bromide Monohydrate (SPIRIVA RESPIMAT) 2.5 MCG/ACT AERS   Other Relevant Orders   Spirometry with graph (Completed)     Endocrine   Hypothyroidism   Relevant Orders   Thyroid Panel With TSH (Completed)   Comprehensive metabolic panel (Completed)   T4, free     Other   Asthma case management patient - Primary   SOB (shortness of breath)   Relevant Orders   Pulmonary Function Test   Left upper quadrant pain   Relevant Orders   Ambulatory referral to Gastroenterology   Polycythemia   Relevant Orders   CBC (STAT) (Completed)   Other Visit Diagnoses     Hyperlipidemia, unspecified hyperlipidemia type       Relevant Orders   Lipid panel (Completed)   LUQ pain       Relevant Orders   CT Abdomen Pelvis Wo Contrast   Urinalysis, Routine w reflex microscopic (Completed)   Urinary tract infection without hematuria, site unspecified       Relevant Orders   Urine Culture        Follow up plan: No follow-ups on file.

## 2020-10-01 ENCOUNTER — Telehealth: Payer: Self-pay

## 2020-10-01 ENCOUNTER — Telehealth: Payer: Self-pay | Admitting: Internal Medicine

## 2020-10-01 ENCOUNTER — Other Ambulatory Visit: Payer: Self-pay | Admitting: Internal Medicine

## 2020-10-01 LAB — LIPID PANEL
Chol/HDL Ratio: 5.1 ratio — ABNORMAL HIGH (ref 0.0–4.4)
Cholesterol, Total: 182 mg/dL (ref 100–199)
HDL: 36 mg/dL — ABNORMAL LOW (ref >39)
LDL Chol Calc (NIH): 96 mg/dL (ref 0–99)
Triglycerides: 295 mg/dL — ABNORMAL HIGH (ref 0–149)
VLDL Cholesterol Cal: 50 mg/dL — ABNORMAL HIGH (ref 5–40)

## 2020-10-01 LAB — COMPREHENSIVE METABOLIC PANEL
ALT: 30 IU/L (ref 0–32)
AST: 24 IU/L (ref 0–40)
Albumin/Globulin Ratio: 1.4 (ref 1.2–2.2)
Albumin: 4.3 g/dL (ref 3.8–4.9)
Alkaline Phosphatase: 141 IU/L — ABNORMAL HIGH (ref 44–121)
BUN/Creatinine Ratio: 10 (ref 9–23)
BUN: 13 mg/dL (ref 6–24)
Bilirubin Total: 0.2 mg/dL (ref 0.0–1.2)
CO2: 18 mmol/L — ABNORMAL LOW (ref 20–29)
Calcium: 9.8 mg/dL (ref 8.7–10.2)
Chloride: 98 mmol/L (ref 96–106)
Creatinine, Ser: 1.26 mg/dL — ABNORMAL HIGH (ref 0.57–1.00)
Globulin, Total: 3 g/dL (ref 1.5–4.5)
Glucose: 96 mg/dL (ref 65–99)
Potassium: 4.4 mmol/L (ref 3.5–5.2)
Sodium: 136 mmol/L (ref 134–144)
Total Protein: 7.3 g/dL (ref 6.0–8.5)
eGFR: 51 mL/min/{1.73_m2} — ABNORMAL LOW (ref >59)

## 2020-10-01 LAB — CBC
Hematocrit: 47.1 % — ABNORMAL HIGH (ref 34.0–46.6)
Hemoglobin: 16.1 g/dL — ABNORMAL HIGH (ref 11.1–15.9)
MCH: 30.8 pg (ref 26.6–33.0)
MCHC: 34.2 g/dL (ref 31.5–35.7)
MCV: 90 fL (ref 79–97)
Platelets: 208 10*3/uL (ref 150–450)
RBC: 5.23 x10E6/uL (ref 3.77–5.28)
RDW: 14.5 % (ref 11.7–15.4)
WBC: 9.1 10*3/uL (ref 3.4–10.8)

## 2020-10-01 LAB — THYROID PANEL WITH TSH
Free Thyroxine Index: 2 (ref 1.2–4.9)
T3 Uptake Ratio: 24 % (ref 24–39)
T4, Total: 8.4 ug/dL (ref 4.5–12.0)
TSH: 7.54 u[IU]/mL — ABNORMAL HIGH (ref 0.450–4.500)

## 2020-10-01 MED ORDER — LEVOTHYROXINE SODIUM 50 MCG PO TABS: 50.0000 ug | ORAL_TABLET | Freq: Every day | ORAL | 4 refills | Status: DC

## 2020-10-01 MED ORDER — SPIRIVA RESPIMAT 2.5 MCG/ACT IN AERS: 2.0000 | INHALATION_SPRAY | Freq: Every day | RESPIRATORY_TRACT | 5 refills | Status: DC

## 2020-10-01 NOTE — Telephone Encounter (Signed)
Copied from CRM [id number]. Topic: General - Other >> Sep 30, 2020  5:15 PM Pawlus, Maxine Glenn A wrote: Reason for CRM: PA auth called in regards to a fax they sent over for [facility], please advise if you have received this PA request.

## 2020-10-01 NOTE — Telephone Encounter (Signed)
Copied from CRM [id number]. Topic: General - Other >> Sep 30, 2020  4:47 PM Gaetana Michaelis A wrote: Reason for CRM: Patient would like to be contacted by a member of staff when possible  Patient has received push-back from their insurance company regarding multiple prescriptions for their traMADol  and ozempic  Please contact to further advise when possible

## 2020-10-01 NOTE — Telephone Encounter (Signed)
Patient would like the nurse to call regarding a referral that was sent to the wrong GYN.  Patient stated she has a regular GYN, Dr. Jean Rosenthal, and does not want to change.  She would like the referral to be sent to her current GYN.  Please advise.

## 2020-10-01 NOTE — Telephone Encounter (Signed)
Patient aware referral was sent to GI not GYN.

## 2020-10-01 NOTE — Telephone Encounter (Signed)
Contacted pharmacy, they will continue with review and let us know the response

## 2020-10-01 NOTE — Telephone Encounter (Signed)
Called patient and informed her that her GI appt has been moved up to October 14, 2020 at 330 in [place]. Patient verbalized understanding

## 2020-10-02 ENCOUNTER — Telehealth: Payer: Self-pay

## 2020-10-02 NOTE — Telephone Encounter (Signed)
Please advise 

## 2020-10-02 NOTE — Telephone Encounter (Signed)
Copied from CRM [id number]. Topic: General - Other >> Oct 01, 2020  4:49 PM Herby Abraham C wrote: Reason for CRM: Dr. Fritz Pickerel with pt's insurance company is calling in for assistance. She says that pt has been prescribed Ozempic. Says that pt's insurance isn't covering medication.  Dr. Fritz Pickerel would like to discuss getting this medication covered further.      CB: [phone]

## 2020-10-02 NOTE — Telephone Encounter (Signed)
Pt stated that insurance denied coverage for Ozempic and they advised her that the wouldn't cover it for the reasoning of obesity / pt stated the cost was over $1000 and is asking for an alternate lower cost med if possible / please advise

## 2020-10-03 ENCOUNTER — Ambulatory Visit: Payer: 59 | Admitting: Licensed Clinical Social Worker

## 2020-10-03 ENCOUNTER — Telehealth: Payer: Self-pay

## 2020-10-03 NOTE — Chronic Care Management (AMB) (Signed)
    Clinical Social Work  Care Management   Phone Outreach    10/03/2020 Name: Jodi Keith MRN: [id number] DOB: 12-Mar-1966  Jodi Keith is a 55 y.o. year old female who is a primary care patient of Vigg, Avanti, MD .   CCM LCSW reached out to patient today by phone to introduce self, assess needs and offer Care Management services and interventions.    Unable to keep phone appointment today and requested to reschedule.  Plan:Appointment was rescheduled with CCM LCSW for 10/09/20  Review of patient status, including review of consultants reports, relevant laboratory and other test results, and collaboration with appropriate care team members and the patient's provider was performed as part of comprehensive patient evaluation and provision of care management services.    Jenel Lucks, MSW, LCSW [facility] Care Management [place]  [facility] [email or web address] Phone [phone] 1:41 PM

## 2020-10-03 NOTE — Telephone Encounter (Signed)
PA for Ozempic has been denied by [employer]

## 2020-10-06 NOTE — Telephone Encounter (Signed)
Pt is calling for advice since the PA was denied on the ozempic. Please advise CB- [phone]

## 2020-10-06 NOTE — Telephone Encounter (Signed)
Is there an alternative that this patient can try?

## 2020-10-08 ENCOUNTER — Encounter: Payer: Self-pay | Admitting: Internal Medicine

## 2020-10-08 ENCOUNTER — Telehealth: Payer: 59

## 2020-10-08 ENCOUNTER — Other Ambulatory Visit: Payer: Self-pay | Admitting: Internal Medicine

## 2020-10-08 MED ORDER — CONTRAVE 8-90 MG PO TB12: ORAL_TABLET | ORAL | 0 refills | Status: DC

## 2020-10-08 NOTE — Telephone Encounter (Signed)
Pt wants tammy to call back

## 2020-10-08 NOTE — Telephone Encounter (Signed)
PA for Ozempic is being looking in to by Anabel Halon and patient was called and appt. Was set up to discuss weight loss

## 2020-10-08 NOTE — Telephone Encounter (Signed)
Can you please call, alternatives for weight loss need to be discussed - can try oral meds please see if pt is ok with such  Thank you

## 2020-10-08 NOTE — Telephone Encounter (Signed)
Called patient and informed her that we sent in Contrave in place of the Ozempic. Patient verbalized understanding

## 2020-10-08 NOTE — Telephone Encounter (Signed)
Pt calling back and states if Jodi Keith can still call her pls give her a cb at [phone] she is confused about the medication that was denied and the appointments set up. She is confused as her grandbaby was in ED.

## 2020-10-08 NOTE — Progress Notes (Signed)
Receieved message from nursing that ozempic isnt covered by insurance,  Will Change ozempic to contrave dose as below 1 tablet x 7 days 1 tab bid x 7 days then 2 tabs q am and 1 tab 1 pm and see me back in 3 weeks.   Meds ordered this encounter  Medications   Naltrexone-buPROPion HCl ER (CONTRAVE) 8-90 MG TB12    Sig: Start 1 tablet every morning for 7 days, then 1 tablet twice daily for 7 days, then 2 tablets every morning and one every evening    Dispense:  120 tablet    Refill:  0    Needs to fu with me x 3 weeks  Jodi Keith

## 2020-10-09 ENCOUNTER — Encounter: Payer: Self-pay | Admitting: Internal Medicine

## 2020-10-09 ENCOUNTER — Telehealth: Payer: Self-pay

## 2020-10-09 ENCOUNTER — Ambulatory Visit: Payer: 59 | Admitting: Licensed Clinical Social Worker

## 2020-10-09 ENCOUNTER — Telehealth: Payer: Self-pay | Admitting: Internal Medicine

## 2020-10-09 ENCOUNTER — Telehealth (INDEPENDENT_AMBULATORY_CARE_PROVIDER_SITE_OTHER): Payer: 59 | Admitting: Internal Medicine

## 2020-10-09 DIAGNOSIS — R7303 Prediabetes: Secondary | ICD-10-CM | POA: Diagnosis not present

## 2020-10-09 NOTE — Telephone Encounter (Signed)
I would like to see  her today if she wants to thnx. Can be virtual

## 2020-10-09 NOTE — Telephone Encounter (Signed)
Pt scheduled for today at 1:40pm pt verbalized understading.

## 2020-10-09 NOTE — Telephone Encounter (Signed)
Patient called to ask the nurse to call regarding a prescription.  CB# [phone]

## 2020-10-09 NOTE — Telephone Encounter (Signed)
Patient questions addressed.

## 2020-10-09 NOTE — Progress Notes (Signed)
BP 120/74   Pulse 89   LMP  (LMP Unknown)    Subjective:    Patient ID: Jodi Keith, female    DOB: 08-13-1965, 55 y.o.   MRN: [id number]  Chief Complaint  Patient presents with   Weight Loss   tongue sore    HPI: Jodi Keith is a 55 y.o. female   This visit was completed via telephone due to the restrictions of the COVID-19 pandemic. All issues as above were discussed and addressed but no physical exam was performed. If it was felt that the patient should be evaluated in the office, they were directed there. The patient verbally consented to this visit. Patient was unable to complete an audio/visual visit due to Technical difficulties. Due to the catastrophic nature of the COVID-19 pandemic, this visit was done through audio contact only. Location of the patient: home Location of the provider: work Those involved with this call:  Provider: Charlynne Cousins, MD CMA: Frazier Butt, [place] Desk/Registration: Levert Feinstein  Time spent on call: 10 minutes on the phone discussing health concerns. 10 minutes total spent in review of patient's record and preparation of their chart.  Unable to get ozempic sec to insurance, would like an alternative to such.   Still has pain in the Upper Left quadrant , some burning in her vaginal area, toungue feels raw per pt.    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 The problem occurs intermittently. The problem has been unchanged. The pain is located in the LUQ. The pain is at a severity of 4/10. The pain is moderate. Pertinent negatives include no anorexia, arthralgias, belching, constipation, fever, flatus, melena or myalgias. Associated symptoms comments: Is scheduled to see GI for such soon.   Chief Complaint  Patient presents with   Weight Loss   tongue sore    Relevant past medical,  surgical, family and social history reviewed and updated as indicated. Interim medical history since our last visit reviewed. Allergies and medications reviewed and updated.  Review of Systems  Constitutional:  Negative for fever.  Gastrointestinal:  Positive for abdominal pain. Negative for anorexia, constipation, flatus and melena.  Musculoskeletal:  Negative for arthralgias and myalgias.   Per HPI unless specifically indicated above     Objective:    BP 120/74   Pulse 89   LMP  (LMP Unknown)   Wt Readings from Last 3 Encounters:  09/30/20 235 lb (106.6 kg)  09/17/20 235 lb (106.6 kg)  08/05/20 235 lb 9.6 oz (106.9 kg)    Physical Exam  Results for orders placed or performed in visit on 09/30/20  Thyroid Panel With TSH  Result Value Ref Range   TSH 7.540 (H) 0.450 - 4.500 uIU/mL   T4, Total 8.4 4.5 - 12.0 ug/dL   T3 Uptake Ratio 24 24 - 39 %   Free Thyroxine Index 2.0 1.2 - 4.9  Comprehensive metabolic panel  Result Value Ref Range   Glucose 96 65 - 99 mg/dL   BUN 13 6 - 24 mg/dL   Creatinine, Ser 1.26 (H) 0.57 - 1.00 mg/dL   eGFR 51 (L) >59 mL/min/1.73   BUN/Creatinine Ratio 10 9 - 23   Sodium 136 134 - 144 mmol/L   Potassium 4.4 3.5 - 5.2 mmol/L   Chloride 98 96 - 106 mmol/L   CO2 18 (  L) 20 - 29 mmol/L   Calcium 9.8 8.7 - 10.2 mg/dL   Total Protein 7.3 6.0 - 8.5 g/dL   Albumin 4.3 3.8 - 4.9 g/dL   Globulin, Total 3.0 1.5 - 4.5 g/dL   Albumin/Globulin Ratio 1.4 1.2 - 2.2   Bilirubin Total 0.2 0.0 - 1.2 mg/dL   Alkaline Phosphatase 141 (H) 44 - 121 IU/L   AST 24 0 - 40 IU/L   ALT 30 0 - 32 IU/L  CBC (STAT)  Result Value Ref Range   WBC 9.1 3.4 - 10.8 x10E3/uL   RBC 5.23 3.77 - 5.28 x10E6/uL   Hemoglobin 16.1 (H) 11.1 - 15.9 g/dL   Hematocrit 47.1 (H) 34.0 - 46.6 %   MCV 90 79 - 97 fL   MCH 30.8 26.6 - 33.0 pg   MCHC 34.2 31.5 - 35.7 g/dL   RDW 14.5 11.7 - 15.4 %   Platelets 208 150 - 450 x10E3/uL  Lipid panel  Result Value Ref Range   Cholesterol,  Total 182 100 - 199 mg/dL   Triglycerides 295 (H) 0 - 149 mg/dL   HDL 36 (L) >39 mg/dL   VLDL Cholesterol Cal 50 (H) 5 - 40 mg/dL   LDL Chol Calc (NIH) 96 0 - 99 mg/dL   Chol/HDL Ratio 5.1 (H) 0.0 - 4.4 ratio  Urinalysis, Routine w reflex microscopic  Result Value Ref Range   Specific Gravity, UA 1.015 1.005 - 1.030   pH, UA 6.5 5.0 - 7.5   Color, UA Yellow Yellow   Appearance Ur Clear Clear   Leukocytes,UA Negative Negative   Protein,UA Negative Negative/Trace   Glucose, UA Negative Negative   Ketones, UA Negative Negative   RBC, UA Negative Negative   Bilirubin, UA Negative Negative   Urobilinogen, Ur 0.2 0.2 - 1.0 mg/dL   Nitrite, UA Negative Negative        Current Outpatient Medications:    albuterol (VENTOLIN HFA) 108 (90 Base) MCG/ACT inhaler, Inhale 2 puffs into the lungs every 6 (six) hours as needed for wheezing or shortness of breath., Disp: 16 g, Rfl: 2   amLODipine (NORVASC) 10 MG tablet, Take 1 tablet by mouth daily., Disp: , Rfl:    atorvastatin (LIPITOR) 20 MG tablet, Take 1 tablet (20 mg total) by mouth daily., Disp: 30 tablet, Rfl: 3   famotidine (PEPCID) 20 MG tablet, Take 1 tablet (20 mg total) by mouth at bedtime., Disp: 30 tablet, Rfl: 3   Homeopathic Products (RESTFUL LEGS SL), Place 1 tablet under the tongue at bedtime., Disp: , Rfl:    hydrochlorothiazide (MICROZIDE) 12.5 MG capsule, Take 1 capsule (12.5 mg total) by mouth daily., Disp: 30 capsule, Rfl: 3   ibuprofen (ADVIL) 200 MG tablet, Take 200 mg by mouth every 4 (four) hours as needed (patient states that she takes 6 at a time 5 or 6 times a day). Education- will do pharmacy referral, Disp: , Rfl:    levothyroxine (SYNTHROID) 50 MCG tablet, Take 1 tablet (50 mcg total) by mouth daily before breakfast., Disp: 30 tablet, Rfl: 4   Naltrexone-buPROPion HCl ER (CONTRAVE) 8-90 MG TB12, Start 1 tablet every morning for 7 days, then 1 tablet twice daily for 7 days, then 2 tablets every morning and one every  evening, Disp: 120 tablet, Rfl: 0   naproxen (NAPROSYN) 500 MG tablet, Take 1 tablet (500 mg total) by mouth 2 (two) times daily as needed., Disp: 30 tablet, Rfl: 2   nicotine (NICODERM CQ) 21  mg/24hr patch, Place 1 patch (21 mg total) onto the skin daily., Disp: 28 patch, Rfl: 0   nystatin cream (MYCOSTATIN), Apply 1 application topically 2 (two) times daily., Disp: 30 g, Rfl: 1   Semaglutide,0.25 or 0.5MG /DOS, (OZEMPIC, 0.25 OR 0.5 MG/DOSE,) 2 MG/1.5ML SOPN, Inject 0.5 mg into the skin once a week., Disp: 1.5 mL, Rfl: 6   spironolactone (ALDACTONE) 25 MG tablet, Take 0.5 tablets by mouth daily., Disp: , Rfl:    Tiotropium Bromide Monohydrate (SPIRIVA RESPIMAT) 2.5 MCG/ACT AERS, Inhale 2 puffs into the lungs daily., Disp: 1 each, Rfl: 5   losartan (COZAAR) 50 MG tablet, Take 1 tablet (50 mg total) by mouth daily., Disp: 30 tablet, Rfl: 3    Assessment & Plan:  Obesity : will try sending in contrave sec to Pts insurance will not cover ozempic.       To use contrave 1 tablet every morning for 7 days Then 1 tab twice daily x 7 days Then 2 tabs q am and 1 q pm  See me back in 3 weeks  2. LUQ pain : will need to fu with GI for such   3. Smoking cessation: to start using nicoderm patches on July 10th Smoking cessation advised. Use  nicotine patches in the past. continues to smoke. more than > 5 - 10 mins of time was spent with pt regarding smoking cessation and complications.  4. Prediabetes a1c 5.9  Needs to loose weight to help with this as above for tx prediabetes. Lifestyle modifications advised to pt. A1c at   Portion control and avoiding high carb low fat diet advised.  Diet plan given to pt   exercise plan given and encouraged.  To increase exercise to 150 mins a week ie 21/2 hours a week. Pt verbalises understanding of the above.     Follow up plan: No follow-ups on file.

## 2020-10-09 NOTE — Telephone Encounter (Signed)
Please advise 

## 2020-10-09 NOTE — Telephone Encounter (Signed)
Pt is scheduled for 10/10/2020 at 10am is asking if this can be virtual?

## 2020-10-09 NOTE — Telephone Encounter (Signed)
PA stated for Contrave, through cover my meds, awaiting determination

## 2020-10-10 ENCOUNTER — Encounter: Payer: Self-pay | Admitting: Internal Medicine

## 2020-10-10 ENCOUNTER — Ambulatory Visit: Payer: 59 | Admitting: Internal Medicine

## 2020-10-10 NOTE — Telephone Encounter (Signed)
[email or web address]  Hi Tammy, I found this resource for Contrave but it will still cost the patient $99/month.

## 2020-10-13 NOTE — Patient Instructions (Signed)
Visit Information   Goals Addressed             This Visit's Progress    Anxiety Symptoms Identified       Patient Goals/Self-Care Activities: Over the next 120 days Attend all scheduled appointments with providers Contact office with any questions or concerns Continue with compliance of taking medication  Avoid negative self-talk Spend time or talk with others at least 2 to 3 times per week Practice relaxation or meditation daily Practice positive thinking and self-talk         Patient verbalizes understanding of instructions provided today and agrees to view in MyChart.   Telephone follow up appointment with care management team member scheduled for:11/14/20  Jenel Lucks, MSW, LCSW [facility]  [facility] [email or web address] Phone [phone] 11:51 AM

## 2020-10-13 NOTE — Chronic Care Management (AMB) (Signed)
Care Management Clinical Social Work Note  10/13/2020 Name: Jodi Keith MRN: [id number] DOB: Oct 23, 1965  Jodi Keith is a 55 y.o. year old female who is a primary care patient of Vigg, Avanti, MD.  The Care Management team was consulted for assistance with chronic disease management and coordination needs.  Engaged with patient by telephone for initial visit in response to provider referral for social work chronic care management and care coordination services  Consent to Services:  [name] was given information about Care Management services today including:  Care Management services includes personalized support from designated clinical staff supervised by her physician, including individualized plan of care and coordination with other care providers 24/7 contact phone numbers for assistance for urgent and routine care needs. The patient may stop case management services at any time by phone call to the office staff.  Patient agreed to services and consent obtained.   Assessment: Patient is currently experiencing symptoms of  anxiety which seems to be exacerbated by chronic pain and grief. Patient reports feelings of frustration with losing weight to assist in the management of chronic health conditions. Strategies identified to assist in management of symptoms. Patient is awaiting information regarding medication to assist with weight loss, that she could afford out of pocket. See Care Plan below for interventions and patient self-care actives. Recent life changes /stressors: Management of chronic health conditions, grief, and weight loss Recommendation: Patient may benefit from, and is in agreement to work with LCSW to address care coordination needs and will continue to work with the clinical team to address health care and disease management related needs.  Follow up Plan: Patient would like continued follow-up.  CCM LCSW will follow up with patient on 11/14/20. Patient will  call office if needed prior to next encounter.  SDOH (Social Determinants of Health) assessments and interventions performed:    Advanced Directives Status: Not addressed in this encounter.  Care Plan  Allergies  Allergen Reactions   Prochlorperazine Edisylate Anaphylaxis    coma    Outpatient Encounter Medications as of 10/09/2020  Medication Sig Note   albuterol (VENTOLIN HFA) 108 (90 Base) MCG/ACT inhaler Inhale 2 puffs into the lungs every 6 (six) hours as needed for wheezing or shortness of breath.    amLODipine (NORVASC) 10 MG tablet Take 1 tablet by mouth daily.    atorvastatin (LIPITOR) 20 MG tablet Take 1 tablet (20 mg total) by mouth daily.    famotidine (PEPCID) 20 MG tablet Take 1 tablet (20 mg total) by mouth at bedtime.    Homeopathic Products (RESTFUL LEGS SL) Place 1 tablet under the tongue at bedtime.    hydrochlorothiazide (MICROZIDE) 12.5 MG capsule Take 1 capsule (12.5 mg total) by mouth daily.    ibuprofen (ADVIL) 200 MG tablet Take 200 mg by mouth every 4 (four) hours as needed (patient states that she takes 6 at a time 5 or 6 times a day). Education- will do pharmacy referral    levothyroxine (SYNTHROID) 50 MCG tablet Take 1 tablet (50 mcg total) by mouth daily before breakfast.    losartan (COZAAR) 50 MG tablet Take 1 tablet (50 mg total) by mouth daily.    Naltrexone-buPROPion HCl ER (CONTRAVE) 8-90 MG TB12 Start 1 tablet every morning for 7 days, then 1 tablet twice daily for 7 days, then 2 tablets every morning and one every evening    naproxen (NAPROSYN) 500 MG tablet Take 1 tablet (500 mg total) by mouth 2 (two) times daily  as needed. 08/05/2020: Makes patient sick to her stomach   nicotine (NICODERM CQ) 21 mg/24hr patch Place 1 patch (21 mg total) onto the skin daily.    nystatin cream (MYCOSTATIN) Apply 1 application topically 2 (two) times daily.    Semaglutide,0.25 or 0.5MG /DOS, (OZEMPIC, 0.25 OR 0.5 MG/DOSE,) 2 MG/1.5ML SOPN Inject 0.5 mg into the skin  once a week.    spironolactone (ALDACTONE) 25 MG tablet Take 0.5 tablets by mouth daily.    Tiotropium Bromide Monohydrate (SPIRIVA RESPIMAT) 2.5 MCG/ACT AERS Inhale 2 puffs into the lungs daily.    No facility-administered encounter medications on file as of 10/09/2020.    Patient Active Problem List   Diagnosis Date Noted   Asthma case management patient 09/30/2020   SOB (shortness of breath) 09/30/2020   Left upper quadrant pain 09/30/2020   Hypothyroidism 09/30/2020   Polycythemia 09/30/2020   Moderate asthma with exacerbation 09/17/2020   Hypertensive emergency 05/02/2018    Conditions to be addressed/monitored: Anxiety; Mental Health Concerns   Care Plan : Clinical Social Work (Adult)  Updates made by Bridgett Larsson, LCSW since 10/13/2020 12:00 AM     Problem: Coping Skills (General Plan of Care)      Long-Range Goal: Coping Skills Enhanced   Start Date: 10/09/2020  This Visit's Progress: On track  Priority: Medium  Note:   Current barriers:   Acute Mental Health needs related to Anxiety Mental Health Concerns  Needs Support, Education, and Care Coordination in order to meet unmet mental health needs. Clinical Goal(s): Over the next 120 days, patient will work with SW, counselor and therapist to reduce or manage symptoms of agitation, mood instability, stress, and bipolar until connected for ongoing counseling. Clinical Interventions:  Assessed patient's previous and current treatment, coping skills, support system and barriers to care  Patient interviewed and appropriate assessments performed Patient reports difficulty managing symptoms of anxiety triggered by chronic pain. States that she "struggles daily" Patient is also managing extensive grief regarding the passing of her father, step-mom, sister (suicide), grandfather, and close friend passing all within 3 years. Patient denies SI/HI Anxiety symptoms include, feelings of sadness, irritability, and chronic  pain Patient was successful at identifying protective factors that assist with motivation (9 grandchildren "they are my world") She receives strong support from husband and daughter, who both walk with patient regularly to assist in management of health conditions CCM LCSW provided validation and encouragement. Patient was informed of correlation between one's physical and mental health and was commended for establishing an exercise routine, that can assist with both. Patient shared that her goal is to lose weight to assist in the management of diabetes Patient reports frustration that she has modified diet and implemented exercise in her routine with very minimal results. Patient reports that she was eating salad without dressing, walking "as much as my legs would allow" morning and evening, and decreased soda intake (went from drinking 8-10 pepsi's a day to zero for a month) Patient only drinks water and unsweetened tea Patient is concerned about insurance provider not covering medication for weight loss. States that PCP has attempted to prescribe two medications that weren't covered and was outside of patient's budget Patient has an upcoming appt with GI next week to discuss tx plan to treat upper left quadrant pain/swelling. No barriers to getting to appointments Patient takes medications and get rest when feeling overwhelmed Interventions provided: Solution-Focused Strategies, Mindfulness or Relaxation Training, Active listening / Reflection utilized , Emotional Supportive Provided, and Verbalization  of feelings encouraged  ; Discussed plans with patient for ongoing care management follow up and provided patient with direct contact information for care management team Collaboration with PCP regarding development and update of comprehensive plan of care as evidenced by provider attestation and co-signature Inter-disciplinary care team collaboration (see longitudinal plan of care) Patient  Goals/Self-Care Activities: Over the next 120 days Attend all scheduled appointments with providers Contact office with any questions or concerns Continue with compliance of taking medication  Avoid negative self-talk Spend time or talk with others at least 2 to 3 times per week Practice relaxation or meditation daily Practice positive thinking and self-talk        Jenel Lucks, MSW, LCSW [facility]   [facility] [email or web address] Phone [phone] 11:49 AM

## 2020-10-14 ENCOUNTER — Ambulatory Visit (INDEPENDENT_AMBULATORY_CARE_PROVIDER_SITE_OTHER): Payer: 59 | Admitting: Gastroenterology

## 2020-10-14 ENCOUNTER — Other Ambulatory Visit: Payer: Self-pay

## 2020-10-14 ENCOUNTER — Encounter: Payer: Self-pay | Admitting: Gastroenterology

## 2020-10-14 ENCOUNTER — Telehealth: Payer: Self-pay

## 2020-10-14 VITALS — BP 123/79 | HR 89 | Temp 97.4°F | Wt 236.2 lb

## 2020-10-14 DIAGNOSIS — R748 Abnormal levels of other serum enzymes: Secondary | ICD-10-CM

## 2020-10-14 DIAGNOSIS — R1012 Left upper quadrant pain: Secondary | ICD-10-CM | POA: Diagnosis not present

## 2020-10-14 NOTE — Telephone Encounter (Signed)
Copied from CRM [id number]. Topic: General - Other >> Oct 14, 2020 10:50 AM Gaetana Michaelis A wrote: Reason for CRM: Patient is requesting to speak with staff member "Tammy" in regards to their CT scan   Patient shares that they have concerns about the fluids they're required to drink before the scan   Patient shares that they spoke with the technician and have been advised to get a revised order for IV contrast rather than an oral contrast  Please contact to further advise when possible

## 2020-10-14 NOTE — Telephone Encounter (Signed)
Patient called starting Phentermine, he daughter takes and is affordable for her to take. Wants to know if she can take this. I explained that you did not choose this due to her having hypertension, she said that she takes her blood pressure twice daily and would stop immediately if BP went up.  Also Patient states that when she has to take the medication required to drink for her CT it always makes her vomit, Tech suggested she ask if we can change the order to a CT with IV contrast.   Please advise

## 2020-10-14 NOTE — Patient Instructions (Signed)
Costochondritis Costochondritis is irritation and swelling (inflammation) of the tissue that connects the ribs to the breastbone (sternum). This tissue is called cartilage. Costochondritis causes pain in the front of the chest. Usually, the pain: Starts slowly. Is in more than one rib. What are the causes? The exact cause of this condition is not always known. It results from stress on the tissue in the affected area. The cause of this stress could be: Chest injury. Exercise or activity, such as lifting. Very bad coughing. What increases the risk? You are more likely to develop this condition if you: Are female. Are 30-40 years old. Recently started a new exercise or work activity. Have low levels of vitamin D. Have a condition that makes you cough often. What are the signs or symptoms? The main symptom of this condition is chest pain. The pain: Usually starts slowly and can be sharp or dull. Gets worse with deep breathing, coughing, or exercise. Gets better with rest. May be worse when you press on the affected area of your ribs and breastbone. How is this treated? This condition usually goes away on its own over time. Your doctor may prescribe an NSAID, such as ibuprofen. This can help reduce pain and inflammation. Treatment may also include: Resting and avoiding activities that make pain worse. Putting heat or ice on the painful area. Doing exercises to stretch your chest muscles. If these treatments do not help, your doctor may inject a numbing medicine to help relieve the pain. Follow these instructions at home: Managing pain, stiffness, and swelling   If told, put ice on the painful area. To do this: Put ice in a plastic bag. Place a towel between your skin and the bag. Leave the ice on for 20 minutes, 2-3 times a day. If told, put heat on the affected area. Do this as often as told by your doctor. Use the heat source that your doctor recommends, such as a moist heat pack or  a heating pad. Place a towel between your skin and the heat source. Leave the heat on for 20-30 minutes. Take off the heat if your skin turns bright red. This is very important if you cannot feel pain, heat, or cold. You may have a greater risk of getting burned. Activity Rest as told by your doctor. Do not do anything that makes your pain worse. This includes any activities that use chest, belly (abdomen), and side muscles. Do not lift anything that is heavier than 10 lb (4.5 kg), or the limit that you are told, until your doctor says that it is safe. Return to your normal activities as told by your doctor. Ask your doctor what activities are safe for you. General instructions Take over-the-counter and prescription medicines only as told by your doctor. Keep all follow-up visits as told by your doctor. This is important. Contact a doctor if: You have chills or a fever. Your pain does not go away or it gets worse. You have a cough that does not go away. Get help right away if: You are short of breath. You have very bad chest pain that is not helped by medicines, heat, or ice. These symptoms may be an emergency. Do not wait to see if the symptoms will go away. Get medical help right away. Call your local emergency services (911 in the [place] Do not drive yourself to the hospital. Summary Costochondritis is irritation and swelling (inflammation) of the tissue that connects the ribs to the breastbone (sternum). This condition   causes pain in the front of the chest. Treatment may include medicines, rest, heat or ice, and exercises. This information is not intended to replace advice given to you by your health care provider. Make sure you discuss any questions you have with your health care provider. Document Revised: 02/16/2019 Document Reviewed: 02/16/2019 Elsevier Patient Education  2022 [employer]  

## 2020-10-14 NOTE — Progress Notes (Signed)
Jodi Keith, [place] [address]  Main: [phone]  Fax: [phone]   Gastroenterology Consultation  Referring Provider:     Charlynne Cousins, MD Primary Care Physician:  Charlynne Cousins, MD Reason for Consultation:     Abdominal pain        HPI:    Chief complaint: Abdominal pain  Jodi Keith is a 55 y.o. y/o female referred for consultation & management  by Dr. Neomia Dear, Loman Brooklyn, MD. patient with left upper quadrant abdominal pain for 3 to 4 weeks.  Pain is constant, dull, nonradiating, 4/10, worsens on palpation.  Unrelated to meals.  No nausea or vomiting.  No weight loss.  No altered bowel habits.  Patient was seen by PCP for the symptoms.  I reviewed the PCPs note, and they have ordered him scheduled CT abdomen pelvis for further evaluation of her symptoms.  They also ordered labs and UA during that visit which revealed elevated TSH and no evidence of UTI.  Patient has never had an EGD or colonoscopy.  Denies any dysphagia.  Past Medical History:  Diagnosis Date   Arthritis    Asthma    CHF (congestive heart failure) ([place])    Hypertension     Past Surgical History:  Procedure Laterality Date   APPENDECTOMY     CHOLECYSTECTOMY     TUBAL LIGATION      Prior to Admission medications   Medication Sig Start Date End Date Taking? Authorizing Provider  albuterol (VENTOLIN HFA) 108 (90 Base) MCG/ACT inhaler Inhale 2 puffs into the lungs every 6 (six) hours as needed for wheezing or shortness of breath. 09/17/20  Yes Jon Billings, NP  amLODipine (NORVASC) 10 MG tablet Take 1 tablet by mouth daily. 07/21/20 07/21/21 Yes [provider]  atorvastatin (LIPITOR) 20 MG tablet Take 1 tablet (20 mg total) by mouth daily. 09/09/20  Yes Vigg, Avanti, MD  famotidine (PEPCID) 20 MG tablet Take 1 tablet (20 mg total) by mouth at bedtime. 08/05/20  Yes Vigg, Avanti, MD  Homeopathic Products (RESTFUL LEGS SL) Place 1 tablet under the  tongue at bedtime.   Yes [provider]  hydrochlorothiazide (MICROZIDE) 12.5 MG capsule Take 1 capsule (12.5 mg total) by mouth daily. 07/30/20  Yes Vigg, Avanti, MD  ibuprofen (ADVIL) 200 MG tablet Take 200 mg by mouth every 4 (four) hours as needed (patient states that she takes 6 at a time 5 or 6 times a day). Education- will do pharmacy referral   Yes [provider]  levothyroxine (SYNTHROID) 50 MCG tablet Take 1 tablet (50 mcg total) by mouth daily before breakfast. 10/01/20  Yes Vigg, Avanti, MD  Naltrexone-buPROPion HCl ER (CONTRAVE) 8-90 MG TB12 Start 1 tablet every morning for 7 days, then 1 tablet twice daily for 7 days, then 2 tablets every morning and one every evening 10/08/20  Yes Vigg, Avanti, MD  naproxen (NAPROSYN) 500 MG tablet Take 1 tablet (500 mg total) by mouth 2 (two) times daily as needed. 07/30/20  Yes Vigg, Avanti, MD  nystatin cream (MYCOSTATIN) Apply 1 application topically 2 (two) times daily. 07/30/20  Yes Vigg, Avanti, MD  Semaglutide,0.25 or 0.5MG/DOS, (OZEMPIC, 0.25 OR 0.5 MG/DOSE,) 2 MG/1.5ML SOPN Inject 0.5 mg into the skin once a week. 09/30/20  Yes Vigg, Avanti, MD  spironolactone (ALDACTONE) 25 MG tablet Take 0.5 tablets by mouth daily. 08/16/20  Yes [provider]  Tiotropium Bromide Monohydrate (SPIRIVA RESPIMAT) 2.5 MCG/ACT AERS Inhale  2 puffs into the lungs daily. 10/01/20  Yes Vigg, Avanti, MD  losartan (COZAAR) 50 MG tablet Take 1 tablet (50 mg total) by mouth daily. 08/05/20 09/04/20  Vigg, Avanti, MD  nicotine (NICODERM CQ) 21 mg/24hr patch Place 1 patch (21 mg total) onto the skin daily. Patient not taking: Reported on 10/14/2020 09/30/20   Charlynne Cousins, MD    Family History  Problem Relation Age of Onset   Non-Hodgkin's lymphoma Mother    Lung cancer Mother    Thyroid cancer Mother    Esophageal cancer Mother    Stomach cancer Mother    Ovarian cancer Mother    Skin cancer Mother    Heart failure Mother    Heart disease  Mother    Hypertension Mother    Stroke Mother    Cancer Mother    Arthritis Mother    Asthma Mother    COPD Mother    Early death Mother    Heart attack Father    Diabetes Father    Kidney disease Father    Heart disease Father    Hypertension Father    Heart failure Father    Thyroid disease Sister    Anxiety disorder Sister    Depression Sister    Panic disorder Sister    Diabetes Sister    Suicidality Sister    Early death Sister    Diabetes Brother    Other Brother        drug abuse   Heart disease Maternal Grandmother    Stroke Maternal Grandmother    Cancer Maternal Grandfather    Heart disease Maternal Grandfather    Stroke Maternal Grandfather    Alzheimer's disease Paternal Grandmother    Diabetes Paternal Grandmother    Hypertension Paternal Grandmother    Breast cancer Paternal Grandmother 72   Heart disease Paternal Grandfather    Breast cancer Maternal Aunt 60     Social History   Tobacco Use   Smoking status: Every Day    Packs/day: 1.00    Pack years: 0.00    Types: Cigarettes   Smokeless tobacco: Never  Vaping Use   Vaping Use: Never used  Substance Use Topics   Alcohol use: Never   Drug use: Never    Allergies as of 10/14/2020 - Review Complete 10/14/2020  Allergen Reaction Noted   Prochlorperazine edisylate Anaphylaxis 07/24/2012    Review of Systems:    All systems reviewed and negative except where noted in HPI.   Physical Exam:  Constitutional: General:   Alert,  Well-developed, well-nourished, pleasant and cooperative in NAD BP 123/79   Pulse 89   Temp (!) 97.4 F (36.3 C) (Oral)   Wt 236 lb 3.2 oz (107.1 kg)   LMP  (LMP Unknown)   BMI 42.11 kg/m   Eyes:  Sclera clear, no icterus.   Conjunctiva pink. PERRLA  Ears:  No scars, lesions or masses, Normal auditory acuity. Nose:  No deformity, discharge, or lesions. Mouth:  No deformity or lesions, oropharynx pink & moist.  Neck:  Supple; no masses or  thyromegaly.  Respiratory: Normal respiratory effort, Normal percussion  Gastrointestinal:  No bruits.  Soft, non-tender and non-distended without masses, hepatosplenomegaly or hernias noted.  No guarding or rebound tenderness.   Erythema present under left breast.  Tender to mild palpation left lower lobe area.  Cardiac: No clubbing or edema.  No cyanosis. Normal posterior tibial pedal pulses noted.  Lymphatic:  No significant cervical or axillary adenopathy.  Psych:  Alert and cooperative. Normal mood and affect.  Musculoskeletal:  Normal gait. Head normocephalic, atraumatic. Symmetrical without gross deformities. 5/5 Upper and Lower extremity strength bilaterally.  Skin: Warm. Intact with no breakdown, but erythema present under left breast as noted above. No jaundice.  Neurologic:  Face symmetrical, tongue midline, Normal sensation to touch;  grossly normal neurologically.  Psych:  Alert and oriented x3, Alert and cooperative. Normal mood and affect.   Labs: CBC    Component Value Date/Time   WBC 9.1 09/30/2020 1021   RBC 5.23 09/30/2020 1021   HGB 16.1 (H) 09/30/2020 1021   HCT 47.1 (H) 09/30/2020 1021   PLT 208 09/30/2020 1021   MCV 90 09/30/2020 1021   MCH 30.8 09/30/2020 1021   MCHC 34.2 09/30/2020 1021   RDW 14.5 09/30/2020 1021   LYMPHSABS 2.8 07/30/2020 1034   EOSABS 0.1 07/30/2020 1034   BASOSABS 0.1 07/30/2020 1034   CMP     Component Value Date/Time   NA 136 09/30/2020 1021   K 4.4 09/30/2020 1021   CL 98 09/30/2020 1021   CO2 18 (L) 09/30/2020 1021   GLUCOSE 96 09/30/2020 1021   BUN 13 09/30/2020 1021   CREATININE 1.26 (H) 09/30/2020 1021   CALCIUM 9.8 09/30/2020 1021   PROT 7.3 09/30/2020 1021   ALBUMIN 4.3 09/30/2020 1021   AST 24 09/30/2020 1021   ALT 30 09/30/2020 1021   ALKPHOS 141 (H) 09/30/2020 1021   BILITOT 0.2 09/30/2020 1021    Imaging Studies: CT stone study April 2022 with right ovarian cystic mass  Rib x-ray February 2022 with  no evidence of left rib fracture  Pelvic US with right ovarian cyst April 2022  Assessment and Plan:   Jodi Keith is a 55 y.o. y/o female has been referred for here for evaluation of left upper quadrant abdominal pain  Clinical signs and symptoms most consistent with musculoskeletal etiology of pain such as costochondritis  Pain is reproducible on palpation, does not worsen with meals, and has been constant  She also has erythema present under her left breast, that appears to be a fungal infection and patient states she was prescribed nystatin by her primary care provider for this.  In addition to her primary care provider note, I have also reviewed her ED note from April and February 2022 when she went there for abdominal pain.  As per their notes, after above imaging studies and further work-up, etiology of her left-sided pain was unclear.  They suspected musculoskeletal etiology as well.  Patient was given Toradol during the ER visit and advised to follow-up with PCP and OB/GYN in regard to the ovarian cyst.  In February 2022 she left AMA after her above rib x-ray.  Will await CT abdomen pelvis that has already been ordered by PCP to rule out any other etiologies of her ongoing pain.  However, clinical signs and symptoms most consistent with costochondritis  Patient advised to use ice packs 3-4 times a day and Tylenol as needed, with care not to take more than the instructions on the bottle and not more than 3000 mg in a day  I have also advised the patient to wear better fitting bras.  The bra strap underneath her breasts is tight and her breast do not seem well supported with the current bras that she has, which is also likely leading to the fungal infection underneath her breast, and contributing to her costochondritis.  Her alk phos was also recently  elevated.  We will repeat and obtain GGT for further evaluation    Dr Jodi Antigua  Speech recognition software was used  to dictate the above note.

## 2020-10-15 ENCOUNTER — Telehealth: Payer: Self-pay | Admitting: Internal Medicine

## 2020-10-15 ENCOUNTER — Telehealth: Payer: Self-pay | Admitting: Pharmacist

## 2020-10-15 NOTE — Telephone Encounter (Signed)
Patient was informed by [facility] Naltrexone-buPROPion HCl ER (CONTRAVE) 8-90 MG TB12 was denied and to contact PCP office

## 2020-10-15 NOTE — Telephone Encounter (Signed)
Pt is scheduled 7/14 

## 2020-10-15 NOTE — Chronic Care Management (AMB) (Signed)
    Chronic Care Management Pharmacy Assistant   Name: Jodi Keith  MRN: [id number] DOB: May 30, 1965 .  Reason for Encounter: Chart Review   Medications: Outpatient Encounter Medications as of 10/15/2020  Medication Sig Note   albuterol (VENTOLIN HFA) 108 (90 Base) MCG/ACT inhaler Inhale 2 puffs into the lungs every 6 (six) hours as needed for wheezing or shortness of breath.    amLODipine (NORVASC) 10 MG tablet Take 1 tablet by mouth daily.    atorvastatin (LIPITOR) 20 MG tablet Take 1 tablet (20 mg total) by mouth daily.    famotidine (PEPCID) 20 MG tablet Take 1 tablet (20 mg total) by mouth at bedtime.    Homeopathic Products (RESTFUL LEGS SL) Place 1 tablet under the tongue at bedtime.    hydrochlorothiazide (MICROZIDE) 12.5 MG capsule Take 1 capsule (12.5 mg total) by mouth daily.    ibuprofen (ADVIL) 200 MG tablet Take 200 mg by mouth every 4 (four) hours as needed (patient states that she takes 6 at a time 5 or 6 times a day). Education- will do pharmacy referral    levothyroxine (SYNTHROID) 50 MCG tablet Take 1 tablet (50 mcg total) by mouth daily before breakfast.    losartan (COZAAR) 50 MG tablet Take 1 tablet (50 mg total) by mouth daily.    Naltrexone-buPROPion HCl ER (CONTRAVE) 8-90 MG TB12 Start 1 tablet every morning for 7 days, then 1 tablet twice daily for 7 days, then 2 tablets every morning and one every evening    naproxen (NAPROSYN) 500 MG tablet Take 1 tablet (500 mg total) by mouth 2 (two) times daily as needed. 08/05/2020: Makes patient sick to her stomach   nicotine (NICODERM CQ) 21 mg/24hr patch Place 1 patch (21 mg total) onto the skin daily. (Patient not taking: Reported on 10/14/2020)    nystatin cream (MYCOSTATIN) Apply 1 application topically 2 (two) times daily.    Semaglutide,0.25 or 0.5MG /DOS, (OZEMPIC, 0.25 OR 0.5 MG/DOSE,) 2 MG/1.5ML SOPN Inject 0.5 mg into the skin once a week.    spironolactone (ALDACTONE) 25 MG tablet Take 0.5 tablets by mouth daily.     Tiotropium Bromide Monohydrate (SPIRIVA RESPIMAT) 2.5 MCG/ACT AERS Inhale 2 puffs into the lungs daily.    No facility-administered encounter medications on file as of 10/15/2020.    Reviewed chart for medication changes and adherence.  Recent OV, Consult or Hospital visit: 10/14/20-[name], MD (Gastroenterology) Initial Visit. 10/09/20 [name], MD (PCP) Weight loss 09/30/20-[name], MD (PCP) Flank pain and edema. Recent medication changes indicated: Start Spiriva, Start Ozempic.  No gaps in adherence identified. Patient has follow up scheduled with pharmacy team. No further action required.  Rance Muir, RMA [facility] Concierge

## 2020-10-16 ENCOUNTER — Telehealth: Payer: Self-pay

## 2020-10-16 ENCOUNTER — Telehealth: Payer: Self-pay | Admitting: Pharmacist

## 2020-10-16 ENCOUNTER — Other Ambulatory Visit: Payer: Self-pay

## 2020-10-16 DIAGNOSIS — R1012 Left upper quadrant pain: Secondary | ICD-10-CM

## 2020-10-16 NOTE — Telephone Encounter (Signed)
She cannot take phenteramine secondary to her multiple medical problems. Please let her know  Thnx

## 2020-10-16 NOTE — Telephone Encounter (Signed)
-----   Message from Pasty Spillers, MD sent at 10/14/2020  5:16 PM EDT ----- Sinda Du,  This pt needs labs drawn for elevated liver enzymes based on some previous labs I was able to review. Can you please have her do these. Thank you

## 2020-10-16 NOTE — Chronic Care Management (AMB) (Signed)
    Chronic Care Management Pharmacy Assistant   Name: Jodi Keith  MRN: [id number] DOB: July 14, 1965   Reason for Encounter: Patient assistance application Ozempic    Medications: Outpatient Encounter Medications as of 10/16/2020  Medication Sig Note   albuterol (VENTOLIN HFA) 108 (90 Base) MCG/ACT inhaler Inhale 2 puffs into the lungs every 6 (six) hours as needed for wheezing or shortness of breath.    amLODipine (NORVASC) 10 MG tablet Take 1 tablet by mouth daily.    atorvastatin (LIPITOR) 20 MG tablet Take 1 tablet (20 mg total) by mouth daily.    famotidine (PEPCID) 20 MG tablet Take 1 tablet (20 mg total) by mouth at bedtime.    Homeopathic Products (RESTFUL LEGS SL) Place 1 tablet under the tongue at bedtime.    hydrochlorothiazide (MICROZIDE) 12.5 MG capsule Take 1 capsule (12.5 mg total) by mouth daily.    ibuprofen (ADVIL) 200 MG tablet Take 200 mg by mouth every 4 (four) hours as needed (patient states that she takes 6 at a time 5 or 6 times a day). Education- will do pharmacy referral    levothyroxine (SYNTHROID) 50 MCG tablet Take 1 tablet (50 mcg total) by mouth daily before breakfast.    losartan (COZAAR) 50 MG tablet Take 1 tablet (50 mg total) by mouth daily.    Naltrexone-buPROPion HCl ER (CONTRAVE) 8-90 MG TB12 Start 1 tablet every morning for 7 days, then 1 tablet twice daily for 7 days, then 2 tablets every morning and one every evening    naproxen (NAPROSYN) 500 MG tablet Take 1 tablet (500 mg total) by mouth 2 (two) times daily as needed. 08/05/2020: Makes patient sick to her stomach   nicotine (NICODERM CQ) 21 mg/24hr patch Place 1 patch (21 mg total) onto the skin daily. (Patient not taking: Reported on 10/14/2020)    nystatin cream (MYCOSTATIN) Apply 1 application topically 2 (two) times daily.    Semaglutide,0.25 or 0.5MG /DOS, (OZEMPIC, 0.25 OR 0.5 MG/DOSE,) 2 MG/1.5ML SOPN Inject 0.5 mg into the skin once a week.    spironolactone (ALDACTONE) 25 MG tablet Take  0.5 tablets by mouth daily.    Tiotropium Bromide Monohydrate (SPIRIVA RESPIMAT) 2.5 MCG/ACT AERS Inhale 2 puffs into the lungs daily.    No facility-administered encounter medications on file as of 10/16/2020.    Patient assistance application completed for ozempic. Mailed to patient by [employer]. Patient will return application to pcp office once completed.  Lura Em Clinical Pharmacist Assistant [phone]

## 2020-10-16 NOTE — Telephone Encounter (Signed)
We will mail her an Ozempic PAP.

## 2020-10-16 NOTE — Telephone Encounter (Signed)
Patient was informed that Phentermine would not be prescribed to her due to multiple medical issues, and a new CT order was placed stated the use of IV contrast instead of oral contrast

## 2020-10-16 NOTE — Telephone Encounter (Signed)
Pt is aware as instructed... Pt sates she is going out of town tomorrow and will get labs when she returns

## 2020-10-17 ENCOUNTER — Telehealth: Payer: Self-pay

## 2020-10-17 NOTE — Telephone Encounter (Signed)
Copied from CRM [id number]. Topic: General - Other >> Oct 17, 2020  3:10 PM Gaetana Michaelis A wrote: Reason for CRM: Eilene Ghazi with [employer] has made contact regarding a prior authorization for the patient   Eilene Ghazi would like to speak with staff member "Brayton Caves" when possible  Please contact to advise further

## 2020-10-17 NOTE — Telephone Encounter (Signed)
PA started for Ozempic through cover my meds due to having prediabetes. Awaiting on determination

## 2020-10-28 ENCOUNTER — Other Ambulatory Visit: Payer: Self-pay | Admitting: Internal Medicine

## 2020-10-30 ENCOUNTER — Ambulatory Visit: Payer: 59 | Admitting: Internal Medicine

## 2020-10-31 ENCOUNTER — Ambulatory Visit: Payer: 59 | Admitting: Internal Medicine

## 2020-10-31 ENCOUNTER — Other Ambulatory Visit: Payer: Self-pay | Admitting: Obstetrics and Gynecology

## 2020-10-31 ENCOUNTER — Ambulatory Visit
Admission: RE | Admit: 2020-10-31 | Discharge: 2020-10-31 | Disposition: A | Discharge status: Home / Self Care | Payer: 59 | Source: Ambulatory Visit | Attending: Obstetrics and Gynecology | Admitting: Obstetrics and Gynecology

## 2020-10-31 ENCOUNTER — Other Ambulatory Visit: Payer: Self-pay

## 2020-10-31 DIAGNOSIS — N83201 Unspecified ovarian cyst, right side: Secondary | ICD-10-CM

## 2020-11-03 ENCOUNTER — Ambulatory Visit (INDEPENDENT_AMBULATORY_CARE_PROVIDER_SITE_OTHER): Payer: 59 | Admitting: Obstetrics and Gynecology

## 2020-11-03 ENCOUNTER — Other Ambulatory Visit: Payer: Self-pay

## 2020-11-03 ENCOUNTER — Ambulatory Visit
Admission: RE | Admit: 2020-11-03 | Discharge: 2020-11-03 | Disposition: A | Discharge status: Home / Self Care | Payer: 59 | Source: Ambulatory Visit | Attending: Internal Medicine | Admitting: Internal Medicine

## 2020-11-03 ENCOUNTER — Encounter: Payer: Self-pay | Admitting: Obstetrics and Gynecology

## 2020-11-03 VITALS — BP 122/79 | Wt 231.0 lb

## 2020-11-03 DIAGNOSIS — N83201 Unspecified ovarian cyst, right side: Secondary | ICD-10-CM | POA: Diagnosis not present

## 2020-11-03 DIAGNOSIS — R1012 Left upper quadrant pain: Secondary | ICD-10-CM

## 2020-11-03 NOTE — Progress Notes (Signed)
Virtual Visit via Telephone Note  I connected with Jodi Keith on 11/03/20 at  1:50 PM EDT by telephone and verified that I am speaking with the correct person using two identifiers.   I discussed the limitations, risks, security and privacy concerns of performing an evaluation and management service by telephone and the availability of in person appointments. I also discussed with the patient that there may be a patient responsible charge related to this service. The patient expressed understanding and agreed to proceed.  The patient was at home I spoke with the patient from my  office The names of people involved in this encounter were: Jodi Keith and Jodi Mohair, MD.   History of Present Illness: 55 y.o. female who presents via telephone visit for follow up from an ultrasound for a right ovarian cyst.  See note from me from April for details.  She had her ultrasound 3 days ago. Essentially, the cyst is still present, appears simple, and is unchanged in size from prior.  There are no concerning characteristics of the cyst on ultrasound. She notes no symptoms today from this area.    Observations/Objective: Physical Exam could not be performed. Because of the COVID-19 outbreak this visit was performed over the phone and not in person.   Assessment and Plan:   ICD-10-CM   1. Right ovarian cyst  N83.201        US PELVIC COMPLETE W TRANSVAGINAL AND TORSION R/O  Result Date: 10/31/2020 CLINICAL DATA:  Follow-up examination of right ovarian cyst. EXAM: DOPPLER ULTRASOUND OF OVARIES TECHNIQUE: Color and duplex Doppler ultrasound was utilized to evaluate blood flow to the ovaries. Both transabdominal and transvaginal ultrasound examinations of the pelvis were performed. Transabdominal technique was performed for global imaging of the pelvis including uterus, ovaries, adnexal regions, and pelvic cul-de-sac. It was necessary to proceed with endovaginal exam following the  transabdominal exam to visualize the uterus, endometrium, and ovaries. COMPARISON:  Prior ultrasound from 07/21/2020. FINDINGS: Uterus Measurements: 7.6 x 3.7 x 4.8 cm = volume: 71.0 mL. Uterus is anteverted. 8 mm calcification with posterior acoustic shadowing seen at the posterior margin of the uterine fundus, stable from prior, and of doubtful significance. No other discrete fibroid. Few nabothian cysts noted about the cervix. Endometrium Thickness: 6 mm.  No focal abnormality visualized. Right ovary Measurements: 4.2 x 3.9 x 4.1 cm = volume: 34.3 mL. Again seen is a simple anechoic cyst arising from the right ovary, measuring 3.7 x 2.8 x 2.9 cm, unchanged. No internal complexity, vascularity, or solid component. Left ovary Measurements: 1.6 x 1.4 x 1.7 cm = volume: 2.0 mL. Normal appearance/no adnexal mass. Pulsed Doppler evaluation of the right ovary demonstrates normal low-resistance arterial and venous waveforms. Doppler waveforms the left ovary were difficult to obtain given the deep position of the left ovary. No free fluid within the pelvis. IMPRESSION: 1. 3.7 cm simple right ovarian cyst, stable from prior. An additional short interval follow-up ultrasound in 3-6 months to document stability over the course of 1 year is suggested note: This recommendation does not apply to premenarchal patients or to those with increased risk (genetic, family history, elevated tumor markers or other high-risk factors) of ovarian cancer. Reference: Radiology 2019 Nov; 293(2):359-371. 2. No evidence for right ovarian torsion. Electronically Signed   By: Rise Mu M.D.   On: 10/31/2020 19:02     Follow Up Instructions: Follow up ultrasound in 6 months unless she develops symptoms. Decision to remove or monitor the  cyst discussed.  She feels comfortable with monitoring the cyst for now.    I discussed the assessment and treatment plan with the patient. The patient was provided an opportunity to ask questions  and all were answered. The patient agreed with the plan and demonstrated an understanding of the instructions.   The patient was advised to call back or seek an in-person evaluation if the symptoms worsen or if the condition fails to improve as anticipated.  I provided 14 minutes of non-face-to-face time during this encounter.  Jodi Mohair, MD  [facility], [facility] 11/03/2020 1:56 PM

## 2020-11-04 ENCOUNTER — Telehealth: Payer: 59 | Admitting: General Practice

## 2020-11-04 ENCOUNTER — Ambulatory Visit: Payer: Self-pay | Admitting: General Practice

## 2020-11-04 DIAGNOSIS — R1012 Left upper quadrant pain: Secondary | ICD-10-CM

## 2020-11-04 DIAGNOSIS — F32A Depression, unspecified: Secondary | ICD-10-CM

## 2020-11-04 DIAGNOSIS — J45909 Unspecified asthma, uncomplicated: Secondary | ICD-10-CM

## 2020-11-04 DIAGNOSIS — F4321 Adjustment disorder with depressed mood: Secondary | ICD-10-CM

## 2020-11-04 DIAGNOSIS — I10 Essential (primary) hypertension: Secondary | ICD-10-CM

## 2020-11-04 DIAGNOSIS — W19XXXA Unspecified fall, initial encounter: Secondary | ICD-10-CM

## 2020-11-04 DIAGNOSIS — F419 Anxiety disorder, unspecified: Secondary | ICD-10-CM

## 2020-11-04 DIAGNOSIS — F17200 Nicotine dependence, unspecified, uncomplicated: Secondary | ICD-10-CM

## 2020-11-04 DIAGNOSIS — J45901 Unspecified asthma with (acute) exacerbation: Secondary | ICD-10-CM

## 2020-11-04 NOTE — Chronic Care Management (AMB) (Signed)
Care Management    RN Visit Note  11/04/2020 Name: Jodi Keith MRN: [id number] DOB: 02/18/1966  Subjective: Jodi Keith is a 55 y.o. year old female who is a primary care patient of Vigg, Avanti, MD. The care management team was consulted for assistance with disease management and care coordination needs.    Engaged with patient by telephone for follow up visit in response to provider referral for case management and/or care coordination services.   Consent to Services:   Ms. Bains was given information about Care Management services today including:  Care Management services includes personalized support from designated clinical staff supervised by her physician, including individualized plan of care and coordination with other care providers 24/7 contact phone numbers for assistance for urgent and routine care needs. The patient may stop case management services at any time by phone call to the office staff.  Patient agreed to services and consent obtained.   Assessment: Review of patient past medical history, allergies, medications, health status, including review of consultants reports, laboratory and other test data, was performed as part of comprehensive evaluation and provision of chronic care management services.   SDOH (Social Determinants of Health) assessments and interventions performed:  SDOH Interventions    Flowsheet Row Most Recent Value  SDOH Interventions   Physical Activity Interventions Other (Comments)  [the patient is now walking every day. 30 minutes in the am and 30 minutes in the afternoon]        Care Plan  Allergies  Allergen Reactions   Prochlorperazine Edisylate Anaphylaxis    coma   Contrast Media [Iodinated Diagnostic Agents] Palpitations and Other (See Comments)    Patient had severe hypotension, rapid heart beat after initial contrast injection "years ago." For recent procedure using IV contrast patient had appropriate 13 hour  premedication.     Outpatient Encounter Medications as of 11/04/2020  Medication Sig Note   albuterol (VENTOLIN HFA) 108 (90 Base) MCG/ACT inhaler Inhale 2 puffs into the lungs every 6 (six) hours as needed for wheezing or shortness of breath.    amLODipine (NORVASC) 10 MG tablet Take 1 tablet by mouth daily.    atorvastatin (LIPITOR) 20 MG tablet Take 1 tablet (20 mg total) by mouth daily.    famotidine (PEPCID) 20 MG tablet Take 1 tablet (20 mg total) by mouth at bedtime.    Homeopathic Products (RESTFUL LEGS SL) Place 1 tablet under the tongue at bedtime.    hydrochlorothiazide (MICROZIDE) 12.5 MG capsule Take 1 capsule (12.5 mg total) by mouth daily.    ibuprofen (ADVIL) 200 MG tablet Take 200 mg by mouth every 4 (four) hours as needed (patient states that she takes 6 at a time 5 or 6 times a day). Education- will do pharmacy referral    levothyroxine (SYNTHROID) 50 MCG tablet Take 1 tablet (50 mcg total) by mouth daily before breakfast.    losartan (COZAAR) 50 MG tablet Take 1 tablet (50 mg total) by mouth daily.    Naltrexone-buPROPion HCl ER (CONTRAVE) 8-90 MG TB12 Start 1 tablet every morning for 7 days, then 1 tablet twice daily for 7 days, then 2 tablets every morning and one every evening (Patient not taking: Reported on 11/03/2020)    naproxen (NAPROSYN) 500 MG tablet Take 1 tablet (500 mg total) by mouth 2 (two) times daily as needed. (Patient not taking: Reported on 11/03/2020) 08/05/2020: Makes patient sick to her stomach   nicotine (NICODERM CQ - DOSED IN MG/24 HOURS) 21 mg/24hr  patch APPLY 1 PATCH TOPICALLY ONCE DAILY (Patient not taking: Reported on 11/03/2020)    nystatin cream (MYCOSTATIN) Apply 1 application topically 2 (two) times daily.    Semaglutide,0.25 or 0.5MG/DOS, (OZEMPIC, 0.25 OR 0.5 MG/DOSE,) 2 MG/1.5ML SOPN Inject 0.5 mg into the skin once a week. (Patient not taking: Reported on 11/03/2020)    spironolactone (ALDACTONE) 25 MG tablet Take 0.5 tablets by mouth daily.     Tiotropium Bromide Monohydrate (SPIRIVA RESPIMAT) 2.5 MCG/ACT AERS Inhale 2 puffs into the lungs daily.    No facility-administered encounter medications on file as of 11/04/2020.    Patient Active Problem List   Diagnosis Date Noted   Asthma case management patient 09/30/2020   SOB (shortness of breath) 09/30/2020   Left upper quadrant pain 09/30/2020   Hypothyroidism 09/30/2020   Polycythemia 09/30/2020   Moderate asthma with exacerbation 09/17/2020   Hypertensive emergency 05/02/2018    Conditions to be addressed/monitored: HTN, Anxiety, Depression, Pulmonary Disease, and Chronic pain, grief, loss and falls, smoker   Care Plan : RNCM: Fall Risk (Adult)  Updates made by Vanita Ingles since 11/04/2020 12:00 AM     Problem: RNCM: Fall Risk   Priority: High     Long-Range Goal: RNCM: Absence of Fall and Fall-Related Injury   Start Date: 08/26/2020  Expected End Date: 07/22/2021  This Visit's Progress: On track  Priority: High  Note:    Current Barriers:  Knowledge Deficits related to fall precautions in patient with chronic pain and weakness in legs, causing falls at times  Decreased adherence to prescribed treatment for fall prevention Unable to independently manage safety in the home as evidence of fall over the past weekend due to leg weakness and giving away Does not contact provider office for questions/concerns Knowledge Deficits related to fall prevention and safety  Chronic Disease Management support and education needs related to preventing falls in a patient with multiple chronic conditions with recent fall without injury  Clinical Goal(s):  patient will demonstrate improved adherence to prescribed treatment plan for decreasing falls as evidenced by patient reporting and review of EMR patient will verbalize using fall risk reduction strategies discussed patient will not experience additional falls patient will verbalize understanding of plan for effective management of  falls prevention and safety patient will work with RNCM, pcp, and CCM team  to address needs related to falls and safety in the patients environment  patient will attend all scheduled medical appointments: 11-10-2020 at 1040 am patient will demonstrate improved adherence to prescribed treatment plan for falls prevention and safety  as evidenced byno new falls, being safe in her environment and working with the CCM team and pcp to optimize health and well being.  Interventions:  Collaboration with Charlynne Cousins, MD regarding development and update of comprehensive plan of care as evidenced by provider attestation and co-signature Inter-disciplinary care team collaboration (see longitudinal plan of care) Provided written and verbal education re: Potential causes of falls and Fall prevention strategies Reviewed medications and discussed potential side effects of medications such as dizziness and frequent urination. 11-04-2020: Review of medications and the patient is mindful of medications that may cause potential fall risk.  Assessed for s/s of orthostatic hypotension. 11-04-2020: Blood pressures around 119/80 previously in the office. The patient denies any light-headedness or dizziness. Will continue to monitor for changes. Knows to change position slowly.  Assessed for falls since last encounter. Fall over the weekend of May 7th. 11-04-2020: Denies any new falls.  Assessed patients knowledge  of fall risk prevention secondary to previously provided education. 11-04-2020: Education and support, the patient denies any new concerns with safety. Actually is walking at least 30 minutes BID and feels much better. States today is a "good day". Assessed working status of life alert bracelet and patient adherence Provided patient information for fall alert systems Evaluation of current treatment plan related to falls prevention and safety and patient's adherence to plan as established by provider. 11-04-2020: The  patient is consistently working with the pcp, specialist and CCM team to manage her health and well being. She denies any new falls and is hopeful that she will soon know what is causing her health concerns.  Advised patient to call the office for new falls, for concerns, or questions  Provided education to patient re: being safe in her surroundings and monitoring triggers that cause falls. The patient states that she often falls because her legs give away and cause her to fall. Left leg weakness with last fall over the weekend. 11-04-2020: Praised the patient for increasing her activity level and building her muscle mass.  Encouraged her to keep walking daily. She also has been swimming with her grandchildren and this makes her happy. She states during the 4th of July her and her husband went to [employer] and this was an enjoyable time for her.  Reviewed medications with patient and discussed compliance. 11-04-2020: The patient states compliance with medications.  Provided patient with falls prevention and safety educational materials related to preventing falls through the [facility] and my chart system Reviewed scheduled/upcoming provider appointments including: 11-10-2020 at 1040 am Discussed plans with patient for ongoing care management follow up and provided patient with direct contact information for care management team Self-Care Deficits:  Unable to independently manage safety as evidence of fall over the weekend, without injury Does not attend all scheduled provider appointments Does not adhere to prescribed medication regimen Does not maintain contact with provider office Does not contact provider office for questions/concerns Patient Goals:  - Utilize safety appropriately with all ambulation- the patient currently does not use any DME for ambulation  - De-clutter walkways - Change positions slowly - Wear secure fitting shoes at all times with ambulation - Utilize home lighting for dim lit  areas - Demonstrate self and pet awareness at all times - activities of daily living skills assessed - barriers to physical activity or exercise addressed - barriers to physical activity or exercise identified - barriers to safety identified - cognition assessed - cognitive-stimulating activities promoted - fall prevention plan reviewed and updated - fear of falling, loss of independence and pain acknowledged - medication list reviewed - modification of home and work environment promoted - vision and/or hearing aid use promoted Follow Up Plan: Telephone follow up appointment with care management team member scheduled for: 01-06-2021 at 1030 am    Task: RNCM: Identify and Manage Contributors to Fall Risk Completed 11/04/2020  Outcome: Positive  Note:   Care Management Activities:    - activities of daily living skills assessed - barriers to physical activity or exercise addressed - barriers to physical activity or exercise identified - barriers to safety identified - cognition assessed - cognitive-stimulating activities promoted - fall prevention plan reviewed and updated - fear of falling, loss of independence and pain acknowledged - medication list reviewed - modification of home and work environment promoted - vision and/or hearing aid use promoted        Care Plan : RNCM: Chronic Pain (Adult)- bilateral legs  and left side  Updates made by Vanita Ingles since 11/04/2020 12:00 AM     Problem: RNCM; Pain Management Plan (Chronic Pain) bilateral legs and left side   Priority: High     Long-Range Goal: RNCM: Pain Management Plan Developed   Start Date: 08/26/2020  Expected End Date: 10/30/2021  This Visit's Progress: On track  Priority: High  Note:   Current Barriers:  Knowledge Deficits related to managing acute/chronic pain Non-adherence to scheduled provider appointments Non-adherence to prescribed medication regimen Difficulty obtaining medications Chronic Disease  Management support and education needs related to chronic pain Unable to independently manage pain and discomfort to bilateral legs and left side  Does not adhere to prescribed medication regimen Does not contact provider office for questions/concerns Nurse Case Manager Clinical Goal(s):  patient will verbalize understanding of plan for managing pain patient will attend all scheduled medical appointments: 11-10-2020 at 1040 am patient will demonstrate use of different relaxation  skills and/or diversional activities to assist with pain reduction (distraction, imagery, relaxation, massage, acupressure, TENS, heat, and cold application patient will report pain at a level less than 3 to 4 on a 10-10 rating scale- reports today is a "good day"- 11-04-2020 patient will use pharmacological and nonpharmacological pain relief strategies patient will verbalize acceptable level of pain relief and ability to engage in desired activities patient will engage in desired activities without an increase in pain level Interventions:  Collaboration with Vigg, Avanti, MD regarding development and update of comprehensive plan of care as evidenced by provider attestation and co-signature Inter-disciplinary care team collaboration (see longitudinal plan of care) - deep breathing, relaxation and mindfulness use promoted - effectiveness of pharmacologic therapy monitored - medication-induced side effects managed - misuse of pain medication assessed - motivation and barriers to change assessed and addressed - mutually acceptable comfort goal set - pain assessed - pain treatment goals reviewed Evaluation of current treatment plan related to pain and patient's adherence to plan as established by provider. 09-09-2020: The  patient called and sent a message through [facility] that she wanted to talk to "Nurse Pam".  Call made to the patient and she was concerned about the new thyroid medications that pcp placed her on today and the  side effects.  The patient states she is frustrated that her pain is not being addressed. Empathetic listening and support given. Advised the patient to talk to the pcp about her concerns and frustrations. Also advised the patient to take the medication for her thyroid and give it an honest try. The patient verbalized understanding. 11-04-2020: The patient has had multiple test since the last outreach. She was supposed to have a CT test yesterday but they would not do it because she has had a reaction to dye in the past and they want the pcp to prescribe prednisone before giving the patient IV dye contrast. The radiology department is supposed to call the pcp for orders. The patient did see OB/GYN and it was determined that the cyst she has on her right ovary is not the cause of her pain. The patient is hopeful that all the testing they are doing will reveal the source of her pain. She is having a good day today.  Advised patient to call the office for changes in level or intensity of pain and discomfort, take medications as directed and work with the CCM team to optimize a plan of care for effective management of pain and discomfort.  Provided education to patient re: taking Ibuprofen  as prescribed, discussing her pain concerns with the pcp. The patient states she has tried to talk to the pcp about her pain but feels the pcp is not listening. She says she is having everything done but the pain being addressed. Reflective listening with education about finding out the root issues of her. 09-09-2020: Review with the patient and the patient does not feel her concerns are being addressed. 11-04-2020: The patient states she is at a much better place today than she has been. She says today is a "good day". Recommended the patient write down questions to ask the pcp at upcoming appointment on 11-10-2020. Will continue to monitor for changes.  Reviewed medications with patient and discussed the concern of the patient taking  Ibuprofen for pain relief and taking 6 pills at one time multiple times a day. The patient states that is the only relief she can get. Has Naproxen but states that does not help her and makes her stomach hurt. Explained the effects of Ibuprofen on the body systems and the need to take as directed. Will discuss with the pcp and pharm D. 11-04-2020: The patient states compliance with her medications regimen at this time. Denies any issues with medications. Is working with pcp and insurance to try and get Ozempic approved for her for weight loss as she is frustrated she can not lose weight.  Discussed plans with patient for ongoing care management follow up and provided patient with direct contact information for care management team Allow patient to maintain a diary of pain ratings, timing, precipitating events, medications, treatments, and what works best to relieve pain,  Refer to support groups and self-help groups Educate patient about the use of pharmacological interventions for pain management- antianxiety, antidepressants, NSAIDS, opioid analgesics,  Explain the importance of lifestyle modifications to effective pain management  Patient Goals/Self Care Activities:  - mutually acceptable comfort goal set - pain assessed - pain management plan developed - pain treatment goals reviewed - patient response to treatment assessed - sharing of pain management plan with teachers and other caregivers encouraged Self-administers medications as prescribed Attends all scheduled provider appointments Calls pharmacy for medication refills Calls provider office for new concerns or questions Follow Up Plan: Telephone follow up appointment with care management team member scheduled for: 01-06-2021 at 1030 am        Task: RNCM: Partner to Develop Chronic Pain Management Plan Completed 11/04/2020  Outcome: Positive  Note:   Care Management Activities:    - mutually acceptable comfort goal set - pain  assessed - pain management plan developed - pain treatment goals reviewed - patient response to treatment assessed - sharing of pain management plan with teachers and other caregivers encouraged        Care Plan : RNCM; Hypertension (Adult)  Updates made by Vanita Ingles since 11/04/2020 12:00 AM     Problem: RNCM: Hypertension (Hypertension)   Priority: Medium     Long-Range Goal: RNCM; Hypertension Monitored   Start Date: 08/26/2020  Expected End Date: 08/26/2021  This Visit's Progress: On track  Priority: Medium  Note:   Objective:  Last practice recorded BP readings:  BP Readings from Last 3 Encounters:  11/03/20 122/79  10/14/20 123/79  10/09/20 120/74    Most recent eGFR/CrCl:  Lab Results  Component Value Date   EGFR 64 07/30/2020    No components found for: CRCL Current Barriers:  Knowledge Deficits related to basic understanding of hypertension pathophysiology and self care management Knowledge Deficits  related to understanding of medications prescribed for management of hypertension Non-adherence to prescribed medication regimen Does not adhere to prescribed medication regimen Does not contact provider office for questions/concerns Case Manager Clinical Goal(s):  patient will verbalize understanding of plan for hypertension management patient will attend all scheduled medical appointments: 11-10-2020 at 1040 am patient will demonstrate improved adherence to prescribed treatment plan for hypertension as evidenced by taking all medications as prescribed, monitoring and recording blood pressure as directed, adhering to low sodium/DASH diet patient will demonstrate improved health management independence as evidenced by checking blood pressure as directed and notifying PCP if SBP>160 or DBP > 90, taking all medications as prescribe, and adhering to a low sodium diet as discussed. patient will verbalize basic understanding of hypertension disease process and self  health management plan as evidenced by compliance with heart healthy diet, compliance with medications, and working with the CCM team to manage health and well being Interventions:  Collaboration with Vigg, Avanti, MD regarding development and update of comprehensive plan of care as evidenced by provider attestation and co-signature Inter-disciplinary care team collaboration (see longitudinal plan of care) Evaluation of current treatment plan related to hypertension self management and patient's adherence to plan as established by provider. Is having swelling in her feet and legs. Scheduled for stress test tomorrow for evaluation. Education on elevation of legs. She does not like to wear anything on her legs, not even pants touching them. Review of ways to help with edema in legs and feet. 11-04-2020: The patient is doing well with managing her blood pressure. The patient states that she is thankful that finally she is getting some answers. Will continue to monitor for changes.  Provided education to patient re: stroke prevention, s/s of heart attack and stroke, DASH diet, complications of uncontrolled blood pressure Reviewed medications with patient and discussed importance of compliance. 11-04-2020: The patient is compliant with medications.  Discussed plans with patient for ongoing care management follow up and provided patient with direct contact information for care management team Advised patient, providing education and rationale, to monitor blood pressure daily and record, calling PCP for findings outside established parameters.  Reviewed scheduled/upcoming provider appointments including: 11-10-2020 at 1040 am Self-Care Activities: - Self administers medications as prescribed Attends all scheduled provider appointments Calls provider office for new concerns, questions, or BP outside discussed parameters Checks BP and records as discussed Follows a low sodium diet/DASH diet Patient Goals: -  check blood pressure weekly - choose a place to take my blood pressure (home, clinic or office, retail store) - write blood pressure results in a log or diary - agree on reward when goals are met - agree to work together to make changes - ask questions to understand - have a family meeting to talk about healthy habits - learn about high blood pressure  - blood pressure trends reviewed - depression screen reviewed - home or ambulatory blood pressure monitoring encouraged Follow Up Plan: Telephone follow up appointment with care management team member scheduled for: 01-06-2021 at 1030 am    Task: RNCM: Identify and Monitor Blood Pressure Elevation Completed 11/04/2020  Outcome: Positive  Note:   Care Management Activities:    - blood pressure trends reviewed - depression screen reviewed - home or ambulatory blood pressure monitoring encouraged        Care Plan : RNCM: Asthma (Adult)  Updates made by Vanita Ingles since 11/04/2020 12:00 AM     Problem: RNCM: Disease Progression (Asthma)   Priority:  Medium     Long-Range Goal: Disease Progression Prevented or Minimized   Start Date: 08/26/2020  Expected End Date: 08/26/2021  This Visit's Progress: On track  Priority: Medium  Note:   Current Barriers:  Knowledge deficits related to basic understanding of asthma disease process Knowledge deficits related to basic asthma  self care/management Knowledge deficit related to importance of energy conservation Unable to independently manage asthma exacerbations  Does not maintain contact with provider office Does not contact provider office for questions/concerns  Case Manager Clinical Goal(s): patient will report utilizing pursed lip breathing for shortness of breath patient will verbalize understanding of asthma action plan and when to seek appropriate levels of medical care patient will engage in lite exercise as tolerated to build/regain stamina and strength and reduce shortness  of breath through activity tolerance patient will verbalize basic understanding of asthma disease process and self care activities  Interventions:  Collaboration with Vigg, Avanti, MD regarding development and update of comprehensive plan of care as evidenced by provider attestation and co-signature Inter-disciplinary care team collaboration (see longitudinal plan of care) UNABLE to independently: manage asthma during periods of exacerbation  Provided patient with basic written and verbal asthma education on self care/management/and exacerbation prevention. The patient states that she needs a refill for her albuterol as the one she has is outdated. She states she has had asthma for a long time that started in her childhood. She only uses her inhaler when she needs it. Will touch base with pcp and collaborate with patients expressed needs. Explained to the patient that she may need to be evaluated by the pcp before an inhaler could be prescribed. Also advised the patient about smoking causing exacerbation. See care plan for smoking cessation. 11-04-2020: The patient is pacing activity and not going out in the heat of the day. Said [place] as it was cooler there and nice outside. She denies any exacerbations with her asthma, also working on smoking cessation. Will continue to monitor for changes.  Provided patient with asthma action plan and reinforced importance of daily self assessment Provided instruction about proper use of medications used for management of asthma including inhalers Advised patient to self assesses asthma  action plan zone and make appointment with provider if in the yellow zone for 48 hours without improvement. Provided patient with education about the role of exercise in the management of asthma  Advised patient to engage in light exercise as tolerated 3-5 days a week Provided education about and advised patient to utilize infection prevention  strategies to reduce risk of respiratory infection  Self-Care Activities:  Patient verbalizes understanding of plan to effective management of asthma  Self administers medications as prescribed Attends all scheduled provider appointments Calls pharmacy for medication refills Attends church or other social activities Performs ADL's independently Performs IADL's independently Calls provider office for new concerns or questions Patient Goals: - do breathing exercises every day - do breathing exercises at least 2 times each day - do exercises in a comfortable position that makes breathing as easy as possible - develop a new routine to improve sleep - don't eat or exercise right before bedtime - eat healthy - get at least 7 to 8 hours of sleep at night - get outdoors every day (weather permitting) - keep room cool and dark - limit daytime naps - practice relaxation or meditation daily - use a fan or white noise in bedroom - use devices that will help like  a cane, sock-puller or reacher - begin a symptom diary - bring symptom diary to all visits - develop a rescue plan - eliminate symptom triggers at home - follow rescue plan if symptoms flare-up - keep follow-up appointments - use an extra pillow to sleep - avoid second hand smoke - eliminate smoking in my home - identify and avoid work-related triggers - identify and remove indoor air pollutants - limit outdoor activity during cold weather - listen for public air quality announcements every day - activity or exercise based on tolerance encouraged - barriers to medication adherence identified - emotional support provided - medication-adherence assessment completed - screen for functional limitations reviewed - side effects of medication monitored - self-awareness of symptom triggers encouraged - symptom control monitored - symptom log or asthma action plan reviewed - symptom triggers identified Follow Up Plan: Telephone  follow up appointment with care management team member scheduled for: 01-06-2021 at 1030 am    Task: RNCM: Monitor and Manage Adherence to Asthma Therapy Completed 11/04/2020  Outcome: Positive  Note:   Care Management Activities:    - activity or exercise based on tolerance encouraged - barriers to medication adherence identified - emotional support provided - medication-adherence assessment completed - screen for functional limitations reviewed - side effects of medication monitored - self-awareness of symptom triggers encouraged - symptom control monitored - symptom log or asthma action plan reviewed - symptom triggers identified        Problem: RNCM: Smoking cessation   Priority: High     Long-Range Goal: RNCM: Smoking Cessation   Start Date: 08/26/2020  Expected End Date: 08/26/2021  This Visit's Progress: On track  Priority: High  Note:   Current Barriers:  Unable to independently stop smoking.  Lacks social connections Does not contact provider office for questions/concerns Tobacco abuse of >30 years; currently smoking 1 ppd Previous quit attempts, unsuccessful several successful using 3 times while she was pregnant and quit because she did not want to harm the baby  Reports smoking within 30 minutes of waking up Reports triggers to smoke include: anxiety, not having the answers to her health problems Reports motivation to quit smoking includes: knows it would be better for her health and well being  On a scale of 1-10, reports MOTIVATION to quit is 7 On a scale of 1-10, reports CONFIDENCE in quitting is 7 Clinical Goal(s):  patient will work with Chief Operating Officer and provider towards tobacco cessation  Interventions: Collaboration with Vigg, Avanti, MD regarding development and update of comprehensive plan of care as evidenced by provider attestation and co-signature Inter-disciplinary care team collaboration (see  longitudinal plan of care) Evaluation of current treatment plan reviewed- the patient wants to quit but states she smokes more when her anxiety level is up. Is interested in medications to help with quitting but states she can not afford. Pharm D referral. 11-04-2020: The patient is diligently working on cutting back on her smoking. She is doing better at managing her anxiety and states she is wanting to change her habits so she can feel better and be there for her family.  Provided contact information for [place] [facility] (1-800-QUIT-NOW). Patient will outreach this group for support. Discussed plans with patient for ongoing care management follow up and provided patient with direct contact information for care management team Provided patient with printed smoking cessation educational materials Reviewed scheduled/upcoming provider appointments including: 11-10-2020- 10:40 am Referred patient to pharmacy team Provided contact information for  [place] [facility] (1-800-QUIT-NOW). Patient will outreach this group for support. Evaluation of current treatment plan reviewed. 11-04-2020: See provider regularly. Actively working on cutting back with goal of quitting  Patient Goals/Self-Care Activities - diversion activities as a habit during cravings  - verbally commit to reducing tobacco consumption - adherence to treatment plan encouraged - barriers to adherence to treatment plan identified - lifestyle changes encouraged - psychosocial concerns monitored - symptoms of respiratory distress monitored  Follow Up Plan: Telephone follow up appointment with care management team member scheduled for: 01-06-2021 at 1030 am    Task: RNCM: Smoking cessation Completed 11/04/2020  Outcome: Positive  Note:   Care Management Activities:    - adherence to treatment plan encouraged - barriers to adherence to treatment plan identified - lifestyle changes encouraged - psychosocial concerns monitored - symptoms of  respiratory distress monitored         Care Plan : RNCM: Depression (Adult) and Anxiety  Updates made by Vanita Ingles since 11/04/2020 12:00 AM     Problem: RNCM: Symptoms (Depression) and Anxiety   Priority: High  Onset Date: 08/26/2020     Long-Range Goal: RNCM; Symptoms Monitored and Managed: Depression and Anxiety   Start Date: 08/26/2020  Expected End Date: 08/26/2021  This Visit's Progress: On track  Priority: High  Note:   Current Barriers:  Ineffective Self Health Maintenance in a patient with Anxiety and Depression Unable to independently depression and anxiety  Has had a lot of losses in her life with death and grief: father, step-mother, sister (suicide), grandfather, good friend, other family members Clinical Goal(s):  Collaboration with Vigg, Avanti, MD regarding development and update of comprehensive plan of care as evidenced by provider attestation and co-signature Inter-disciplinary care team collaboration (see longitudinal plan of care) patient will work with care management team to address care coordination and chronic disease management needs related to Psychosocial Support Mental Health Counseling Level of Care Concerns   Interventions:  Evaluation of current treatment plan related to Anxiety and Depression, Level of care concerns, Mental Health Concerns , and grief and loss  self-management and patient's adherence to plan as established by provider. Collaboration with Charlynne Cousins, MD regarding development and update of comprehensive plan of care as evidenced by provider attestation       and co-signature Inter-disciplinary care team collaboration (see longitudinal plan of care) Discussed plans with patient for ongoing care management follow up and provided patient with direct contact information for care management team Self Care Activities:  Patient verbalizes understanding of plan to effectively manage depression and anxiety due to grief and multiple  chronic conditions  Self administers medications as prescribed Attends all scheduled provider appointments Calls pharmacy for medication refills Attends church or other social activities Performs ADL's independently Performs IADL's independently Calls provider office for new concerns or questions Patient Goals: - activity or exercise based on tolerance encouraged - depression screen reviewed - emotional support provided - healthy lifestyle promoted - medication side effects monitored and managed - pain managed - participation in mental health treatment encouraged - quality of sleep assessed - response to mental health treatment monitored - response to pharmacologic therapy monitored - sleep hygiene techniques encouraged - social activities and relationships encouraged - substance use assessed - work or school reintegration facilitated Follow Up Plan: Telephone follow up appointment with care management team member scheduled for: 01-06-2021 at 1030 am    Task: Alleviate Barriers to Depression Treatment Completed 11/04/2020  Outcome: Positive  Note:  Care Management Activities:    - activity or exercise based on tolerance encouraged - depression screen reviewed - emotional support provided - healthy lifestyle promoted - medication side effects monitored and managed - pain managed - participation in mental health treatment encouraged - quality of sleep assessed - response to mental health treatment monitored - response to pharmacologic therapy monitored - sleep hygiene techniques encouraged - social activities and relationships encouraged - substance use assessed - work or school reintegration facilitated    Notes:      Plan: Telephone follow up appointment with care management team member scheduled for:  01-06-2021 at 36 am  [place], MSN, [facility] Mobile: [phone]

## 2020-11-04 NOTE — Patient Instructions (Signed)
Visit Information   Goals Addressed             This Visit's Progress    RNCM: Manage Chronic Pain       Timeframe:  Long-Range Goal Priority:  High Start Date:              08-26-2020               Expected End Date:   12-16-2021                  Follow Up Date 11-04-2020   - develop a personal pain management plan - plan exercise or activity when pain is best controlled - prioritize tasks for the day - track times pain is worst and when it is best - track what makes the pain worse and what makes it better - use ice or heat for pain relief - work slower and less intense when having pain    Why is this important?   Day-to-day life can be hard when you have chronic pain.  Pain medicine is just one piece of the treatment puzzle.  You can try these action steps to help you manage your pain.    09-09-2020: The patient is frustrated and does not feel her pain is being addressed. Will collaborate with the pcp. 11-04-2020: The patient states today is a good day. She is thankful the pcp is doing what she can to get to the bottom of her pain. The OB/GYN states the pain is not coming from the cyst that she has. She was supposed to have a CT yesterday but because she has had a reaction to the dye in the past they want her to take prednisone before she has the CT.  They will be contacting pcp for orders.  The patient is hopeful they will find out where her source of pain is and help her determine how to effectively manage.      RNCM; Track and Manage My Symptoms-Depression       Timeframe:  Long-Range Goal Priority:  High Start Date:        11-04-2020                     Expected End Date:     11-04-2021                  Follow Up Date 01/06/2021    - avoid negative self-talk - develop a personal safety plan - develop a plan to deal with triggers like holidays, anniversaries - exercise at least 2 to 3 times per week - have a plan for how to handle bad days - journal feelings and what helps  to feel better or worse - spend time or talk with others at least 2 to 3 times per week - spend time or talk with others every day - watch for early signs of feeling worse - write in journal every day    Why is this important?   Keeping track of your progress will help your treatment team find the right mix of medicine and therapy for you.  Write in your journal every day.  Day-to-day changes in depression symptoms are normal. It may be more helpful to check your progress at the end of each week instead of every day.     Notes: 11-04-2020: The patient is having a good day today. The patient has made positive progress in managing her anxiety and depression. She is  cutting back on smoking, she is eating healthier, she wants to lose weight, she is walking BID for at least 30 minutes and she is active with her 9 grandchildren.  She has experienced a lot of loss in her life over the last 3 years. She has lost her father, her step-mom, her sister (suicide), grandfather, and close friend. Recently when returning from vacation they got a call on the way home that her husbands cousin had died and a good friend was killed in a car accident. She states she is handling death and grief better than she has in the past. Praised the patient for positive changes and working with the CCM team and providers to optimize her health and well being.         Patient verbalizes understanding of instructions provided today and agrees to view in MyChart.   Telephone follow up appointment with care management team member scheduled for: 01-06-2021 at 1030 am  Alto Denver RN, MSN, CCM Community Care Coordinator [place]  [facility] Mobile: [phone]

## 2020-11-05 ENCOUNTER — Ambulatory Visit: Payer: 59 | Admitting: Internal Medicine

## 2020-11-10 ENCOUNTER — Ambulatory Visit (INDEPENDENT_AMBULATORY_CARE_PROVIDER_SITE_OTHER): Payer: 59 | Admitting: Internal Medicine

## 2020-11-10 ENCOUNTER — Telehealth: Payer: Self-pay

## 2020-11-10 ENCOUNTER — Other Ambulatory Visit: Payer: Self-pay

## 2020-11-10 ENCOUNTER — Encounter: Payer: Self-pay | Admitting: Internal Medicine

## 2020-11-10 VITALS — BP 127/84 | HR 71 | Temp 98.0°F | Ht 62.8 in | Wt 233.6 lb

## 2020-11-10 DIAGNOSIS — R1012 Left upper quadrant pain: Secondary | ICD-10-CM | POA: Diagnosis not present

## 2020-11-10 DIAGNOSIS — M940 Chondrocostal junction syndrome [Tietze]: Secondary | ICD-10-CM

## 2020-11-10 MED ORDER — SAXENDA 18 MG/3ML ~~LOC~~ SOPN: 0.6000 mg | PEN_INJECTOR | Freq: Every day | SUBCUTANEOUS | 5 refills | Status: DC

## 2020-11-10 MED ORDER — CONTRAST ALLERGY PREMED PACK 3 X 50 MG & 1 X 50 MG PO KIT: 1.0000 "application " | PACK | ORAL | 0 refills | Status: DC

## 2020-11-10 NOTE — Telephone Encounter (Signed)
Please advise 

## 2020-11-10 NOTE — Telephone Encounter (Signed)
Called patient and informed her of the number  [phone]) to reschedule her CT. Patient also stated that the medication you prescribed today the Pharmacy does not carry. She wanted to know if you could send the prednisone and benadryl separately. Also stated that the pharmacy did not receive the prescription for Saxenda.

## 2020-11-10 NOTE — Progress Notes (Signed)
BP 127/84   Pulse 71   Temp 98 F (36.7 C) (Oral)   Ht 5' 2.8" (1.595 m)   Wt 233 lb 9.6 oz (106 kg)   LMP  (LMP Unknown)   SpO2 99%   BMI 41.65 kg/m    Subjective:    Patient ID: Jodi Keith, female    DOB: 22-Mar-1966, 55 y.o.   MRN: [id number]  Chief Complaint  Patient presents with   Abdominal Pain    Was unable to get CT of abd w/ contrast due to not having prednisone and benadryl to take prior to having it downe    Knee Pain    B/l knee pain.  Pt had prednisone 12 hrs then 7 hrs and then 1 hr before procedure. Pt to have a Ct abdomen and pelvis for LUQ .  HPI: Jodi Keith is a 55 y.o. female  Abdominal Pain This is a chronic problem. The current episode started more than 1 month ago. The onset quality is gradual. The pain is located in the LUQ. The pain is at a severity of 6/10. The pain is mild. The quality of the pain is sharp. Pertinent negatives include no anorexia, arthralgias, belching, constipation, diarrhea, fever, flatus, frequency, headaches, hematochezia, melena, myalgias, nausea, vomiting or weight loss.  Knee Pain  The incident occurred more than 1 week ago. The pain has been Constant since onset. Pertinent negatives include no inability to bear weight, loss of motion, muscle weakness or numbness.    Chief Complaint  Patient presents with   Abdominal Pain    Was unable to get CT of abd w/ contrast due to not having prednisone and benadryl to take prior to having it downe    Knee Pain    B/l knee pain.    Relevant past medical, surgical, family and social history reviewed and updated as indicated. Interim medical history since our last visit reviewed. Allergies and medications reviewed and updated.  Review of Systems  Constitutional:  Negative for fever and weight loss.  Gastrointestinal:  Positive for abdominal pain. Negative for anorexia, constipation, diarrhea, flatus, hematochezia, melena, nausea and vomiting.  Genitourinary:  Negative  for frequency.  Musculoskeletal:  Negative for arthralgias and myalgias.  Neurological:  Negative for numbness and headaches.   Per HPI unless specifically indicated above     Objective:    BP 127/84   Pulse 71   Temp 98 F (36.7 C) (Oral)   Ht 5' 2.8" (1.595 m)   Wt 233 lb 9.6 oz (106 kg)   LMP  (LMP Unknown)   SpO2 99%   BMI 41.65 kg/m   Wt Readings from Last 3 Encounters:  11/10/20 233 lb 9.6 oz (106 kg)  11/03/20 231 lb (104.8 kg)  10/14/20 236 lb 3.2 oz (107.1 kg)    Physical Exam Vitals and nursing note reviewed.  Constitutional:      General: She is not in acute distress.    Appearance: Normal appearance. She is not ill-appearing or diaphoretic.  Eyes:     Conjunctiva/sclera: Conjunctivae normal.  Pulmonary:     Breath sounds: No rhonchi.  Abdominal:     General: Abdomen is flat. Bowel sounds are normal. There is no distension.     Palpations: Abdomen is soft. There is no mass.     Tenderness: There is no abdominal tenderness. There is no guarding.  Skin:    General: Skin is warm and dry.     Coloration: Skin is not  jaundiced.     Findings: No erythema.  Neurological:     Mental Status: She is alert.          Current Outpatient Medications:    albuterol (VENTOLIN HFA) 108 (90 Base) MCG/ACT inhaler, Inhale 2 puffs into the lungs every 6 (six) hours as needed for wheezing or shortness of breath., Disp: 16 g, Rfl: 2   amLODipine (NORVASC) 10 MG tablet, Take 1 tablet by mouth daily., Disp: , Rfl:    atorvastatin (LIPITOR) 20 MG tablet, Take 1 tablet (20 mg total) by mouth daily., Disp: 30 tablet, Rfl: 3   famotidine (PEPCID) 20 MG tablet, Take 1 tablet (20 mg total) by mouth at bedtime., Disp: 30 tablet, Rfl: 3   Homeopathic Products (RESTFUL LEGS SL), Place 1 tablet under the tongue at bedtime., Disp: , Rfl:    hydrochlorothiazide (MICROZIDE) 12.5 MG capsule, Take 1 capsule (12.5 mg total) by mouth daily., Disp: 30 capsule, Rfl: 3   ibuprofen (ADVIL) 200 MG  tablet, Take 200 mg by mouth every 4 (four) hours as needed (patient states that she takes 6 at a time 5 or 6 times a day). Education- will do pharmacy referral, Disp: , Rfl:    levothyroxine (SYNTHROID) 50 MCG tablet, Take 1 tablet (50 mcg total) by mouth daily before breakfast., Disp: 30 tablet, Rfl: 4   losartan (COZAAR) 50 MG tablet, Take 1 tablet (50 mg total) by mouth daily., Disp: 30 tablet, Rfl: 3   Naltrexone-buPROPion HCl ER (CONTRAVE) 8-90 MG TB12, Start 1 tablet every morning for 7 days, then 1 tablet twice daily for 7 days, then 2 tablets every morning and one every evening (Patient not taking: Reported on 11/03/2020), Disp: 120 tablet, Rfl: 0   naproxen (NAPROSYN) 500 MG tablet, Take 1 tablet (500 mg total) by mouth 2 (two) times daily as needed. (Patient not taking: Reported on 11/03/2020), Disp: 30 tablet, Rfl: 2   nicotine (NICODERM CQ - DOSED IN MG/24 HOURS) 21 mg/24hr patch, APPLY 1 PATCH TOPICALLY ONCE DAILY (Patient not taking: Reported on 11/03/2020), Disp: 28 patch, Rfl: 0   nystatin cream (MYCOSTATIN), Apply 1 application topically 2 (two) times daily., Disp: 30 g, Rfl: 1   Semaglutide,0.25 or 0.5MG /DOS, (OZEMPIC, 0.25 OR 0.5 MG/DOSE,) 2 MG/1.5ML SOPN, Inject 0.5 mg into the skin once a week. (Patient not taking: Reported on 11/03/2020), Disp: 1.5 mL, Rfl: 6   spironolactone (ALDACTONE) 25 MG tablet, Take 0.5 tablets by mouth daily., Disp: , Rfl:    Tiotropium Bromide Monohydrate (SPIRIVA RESPIMAT) 2.5 MCG/ACT AERS, Inhale 2 puffs into the lungs daily., Disp: 1 each, Rfl: 5    Assessment & Plan:  Obesity :  Vitals with BMI 11/10/2020 11/03/2020 10/14/2020 10/09/2020  Weight 233 lbs 10 oz 231 lbs 236 lbs 3 oz   Will start pt on saxenda ;  Take 0.6 mg x 1 week  1.2mg  x  Week 2 1.8 mg x week 3  2.4mg  x week 4  3.0 mg x week 5  Then continue 3 mg every day   Smoking cesation has done 15 cigs in 2 weeks about a cig a day. Smoking cessation advised. pt refuses chantix. failed  nicotine patches in the past. continues to smoke. more than > 5 - 10 mins of time was spent with pt regarding smoking cessation and complications.  Needs pre medication for CT abdomen Pt has had -ve Ct abdomen still continues to have abdominal pain has seen GI  To use premeds for CT  Adult: Oral prednisone 50 mg at 13, 7, and 1 hour prior to contrast administration.  AND ORAL DIPHENHYDRAMINE 50 MG 1 HOUR pior to contrast administration.   Problem List Items Addressed This Visit   None    No orders of the defined types were placed in this encounter.  No orders of the defined types were placed in this encounter.    Follow up plan: No follow-ups on file.

## 2020-11-10 NOTE — Patient Instructions (Signed)
Inject  0.6 mg daily x 1 week  1.2mg  daily x  Week 2 1.8 mg daily x week 3  2.4mg  daily x week 4  3.0 mg daily x week 5  Then continue 3 mg every day Liraglutide Injection (Weight Management) What is this medication? LIRAGLUTIDE (LIR a GLOO tide) promotes weight loss. It may also be used to maintain weight loss. It works by decreasing appetite. Changes to diet andexercise are often combined with this medication. This medicine may be used for other purposes; ask your health care provider orpharmacist if you have questions. COMMON BRAND NAME(S): Saxenda What should I tell my care team before I take this medication? They need to know if you have any of these conditions: Endocrine tumors (MEN 2) or if someone in your family had these tumors Gallbladder disease High cholesterol History of alcohol abuse problem History of pancreatitis Kidney disease or if you are on dialysis Liver disease Previous swelling of the tongue, face, or lips with difficulty breathing, difficulty swallowing, hoarseness, or tightening of the throat Stomach problems Suicidal thoughts, plans, or attempt; a previous suicide attempt by you or a family member Thyroid cancer or if someone in your family had thyroid cancer An unusual or allergic reaction to liraglutide, other medications, foods, dyes, or preservatives Pregnant or trying to get pregnant Breast-feeding How should I use this medication? This medication is for injection under the skin of your upper leg, stomach area, or upper arm. You will be taught how to prepare and give this medication. Use exactly as directed. Take your medication at regular intervals. Do not takeit more often than directed. This medication comes with INSTRUCTIONS FOR USE. Ask your pharmacist for directions on how to use this medication. Read the information carefully. Talkto your pharmacist or care team if you have questions. It is important that you put your used needles and syringes in a  special sharps container. Do not put them in a trash can. If you do not have a sharpscontainer, call your pharmacist or care team to get one. A special MedGuide will be given to you by the pharmacist with eachprescription and refill. Be sure to read this information carefully each time. Talk to your care team about the use of this medication in children. While it may be prescribed for children as young as 69 years of age for selectedconditions, precautions do apply. Overdosage: If you think you have taken too much of this medicine contact apoison control center or emergency room at once. NOTE: This medicine is only for you. Do not share this medicine with others. What if I miss a dose? If you miss a dose, take it as soon as you can. If it is almost time for your next dose, take only that dose. Do not take double or extra doses. If you miss your dose for 3 days or more, call your care team to talk about how to restartthis medicine. What may interact with this medication? Insulin and other medications for diabetes This list may not describe all possible interactions. Give your health care provider a list of all the medicines, herbs, non-prescription drugs, or dietary supplements you use. Also tell them if you smoke, drink alcohol, or use illegaldrugs. Some items may interact with your medicine. What should I watch for while using this medication? Visit your care team for regular checks on your progress. Drink plenty of fluids while taking this medication. Check with your care team if you get an attack of severe diarrhea, nausea,  and vomiting. The loss of toomuch body fluid can make it dangerous for you to take this medication. This medication may affect blood sugar levels. Ask your care team if changes indiet or medications are needed if you have diabetes. Patients and their families should watch out for worsening depression or thoughts of suicide. Also watch out for sudden changes in feelings such as  feeling anxious, agitated, panicky, irritable, hostile, aggressive, impulsive, severely restless, overly excited and hyperactive, or not being able to sleep. If this happens, especially at the beginning of treatment or after a change indose, call your care team. Women should inform their care team if they wish to become pregnant or think they might be pregnant. Losing weight while pregnant is not advised and maycause harm to the unborn child. Talk to your care team for more information. What side effects may I notice from receiving this medication? Side effects that you should report to your care team as soon as possible: Allergic reactions or angioedema-skin rash, itching, hives, swelling of the face, eyes, lips, tongue, arms, or legs, trouble swallowing or breathing Fast or irregular heartbeat Gallbladder problems-severe stomach pain, nausea, vomiting, fever Kidney injury-decrease in the amount of urine, swelling of the ankles, hands, or feet Pancreatitis-severe stomach pain that spreads to your back or gets worse after eating or when touched, fever, nausea, vomiting Thoughts of suicide or self-harm, worsening mood, feelings of depression Thyroid cancer-new mass or lump in the neck, pain or trouble swallowing, trouble breathing, hoarseness Side effects that usually do not require medical attention (report to your careteam if they continue or are bothersome): Constipation Dizziness Fatigue Headache Loss of Appetite Nausea Upset stomach This list may not describe all possible side effects. Call your doctor for medical advice about side effects. You may report side effects to FDA at1-800-FDA-1088. Where should I keep my medication? Keep out of the reach of children and pets. Store unopened pen in a refrigerator between 2 and 8 degrees C (36 and 46 degrees F). Do not freeze or use if the medication has been frozen. Protect from light and excessive heat. After you first use the pen, it can be  stored at room temperature between 15 and 30 degrees C (59 and 86 degrees F) or in a refrigerator. Throw away your used pen after 30 days or after the expirationdate, whichever comes first. Do not store your pen with the needle attached. If the needle is left on,medication may leak from the pen. NOTE: This sheet is a summary. It may not cover all possible information. If you have questions about this medicine, talk to your doctor, pharmacist, orhealth care provider.  2022 Elsevier/Gold Standard (2020-04-25 13:54:21)

## 2020-11-10 NOTE — Telephone Encounter (Signed)
Copied from [place] [id number]. Topic: General - Other >> Nov 10, 2020 11:57 AM Leward Quan A wrote: Reason for CRM: [facility] called in to inform Dr Neomia Dear that they can not get the Rx sent today for predniSONE & diphenhydrAMINE (CONTRAST ALLERGY PREMED PACK) 3 x 50 MG & 1 x 50 MG KIT. Did suggest a new Rx for Prednisone pack and patient can purchase the Diphenhydramine OTC. Please advise

## 2020-11-11 ENCOUNTER — Encounter: Payer: Self-pay | Admitting: Internal Medicine

## 2020-11-11 MED ORDER — PREDNISONE 50 MG PO TABS: ORAL_TABLET | ORAL | 0 refills | Status: DC

## 2020-11-11 MED ORDER — SAXENDA 18 MG/3ML ~~LOC~~ SOPN: 0.6000 mg | PEN_INJECTOR | Freq: Every day | SUBCUTANEOUS | 5 refills | Status: DC

## 2020-11-11 NOTE — Telephone Encounter (Signed)
Pt calling in again very upset. She states that this extremely time sensitive. She is requesting to have this medication sent in today as her procedure is at 7:30 tomorrow morning. She states that if this is not sent in she will have to reschedule it and be charged for not showing up. Pt is requesting to have a call back one these medications have been sent in. Please advise.

## 2020-11-11 NOTE — Telephone Encounter (Signed)
Medications have already been sent and pt will need to call pharmacy

## 2020-11-11 NOTE — Telephone Encounter (Signed)
PT VERBALIZED UNDERSTANDING.  

## 2020-11-12 ENCOUNTER — Other Ambulatory Visit: Payer: Self-pay

## 2020-11-12 ENCOUNTER — Ambulatory Visit
Admission: RE | Admit: 2020-11-12 | Discharge: 2020-11-12 | Disposition: A | Discharge status: Home / Self Care | Payer: 59 | Source: Ambulatory Visit | Attending: Internal Medicine | Admitting: Internal Medicine

## 2020-11-12 DIAGNOSIS — R1012 Left upper quadrant pain: Secondary | ICD-10-CM | POA: Insufficient documentation

## 2020-11-12 LAB — POCT I-STAT CREATININE: Creatinine, Ser: 1 mg/dL (ref 0.44–1.00)

## 2020-11-12 MED ORDER — IOHEXOL 350 MG/ML SOLN: 100.0000 mL | Freq: Once | INTRAVENOUS | Status: DC | PRN

## 2020-11-12 MED ORDER — IOHEXOL 350 MG/ML SOLN
75.0000 mL | Freq: Once | INTRAVENOUS | Status: AC | PRN
  Administered 2020-11-12: 75 mL via INTRAVENOUS

## 2020-11-13 ENCOUNTER — Telehealth: Payer: 59 | Admitting: Nurse Practitioner

## 2020-11-13 NOTE — Progress Notes (Signed)
Please let pt know there were "  No CT findings to explain left upper quadrant pain " needs to fu with GI as she has seen them if recurs.  Fu with mx x 3 months , she couldn't take contrave / saxenda either for her weight loss - can join weight watchers to start such, she hasnt been able to obtain all  3 that were rx. We can refer her to a nutritionist if she would like Please let her know thnx.

## 2020-11-14 ENCOUNTER — Ambulatory Visit: Payer: 59 | Admitting: Licensed Clinical Social Worker

## 2020-11-17 NOTE — Chronic Care Management (AMB) (Signed)
Care Management Clinical Social Work Note  11/17/2020 Name: Jodi Keith MRN: [id number] DOB: [birth year]  Jodi Keith is a 55 y.o. year old female who is a primary care patient of Vigg, Avanti, MD.  The Care Management team was consulted for assistance with chronic disease management and coordination needs.  Engaged with patient by telephone for follow up visit in response to provider referral for social work chronic care management and care coordination services  Consent to Services:  [name] was given information about Care Management services today including:  Care Management services includes personalized support from designated clinical staff supervised by her physician, including individualized plan of care and coordination with other care providers 24/7 contact phone numbers for assistance for urgent and routine care needs. The patient may stop case management services at any time by phone call to the office staff.  Patient agreed to services and consent obtained.   Assessment: Patient is making progress with care plan , but continues to have difficulty with management of pain . Patient reports frustration with limited options to assist with weight loss. Motivational interviewing strategies implemented to promote mood and patient was successful with identifying self-care activities. Patient is requesting a referral to a Nutritionist. See Care Plan below for interventions and patient self-care actives.  Recent life changes /stressors: Pain and weight management. Financial Strain regarding affording medications  Recommendation: Patient may benefit from, and is in agreement to work with LCSW to address care coordination needs and will continue to work with the clinical team to address health care and disease management related needs.  Follow up Plan: Patient would like continued follow-up from CCM LCSW .  Follow up scheduled in 01/15/21. Patient will call office if needed prior  to next encounter.   SDOH (Social Determinants of Health) assessments and interventions performed:    Advanced Directives Status: Not addressed in this encounter.  Care Plan  Allergies  Allergen Reactions   Prochlorperazine Edisylate Anaphylaxis    coma   Contrast Media [Iodinated Diagnostic Agents] Palpitations and Other (See Comments)    Patient had severe hypotension, rapid heart beat after initial contrast injection "years ago." For recent procedure using IV contrast patient had appropriate 13 hour premedication.     Outpatient Encounter Medications as of 11/14/2020  Medication Sig Note   albuterol (VENTOLIN HFA) 108 (90 Base) MCG/ACT inhaler Inhale 2 puffs into the lungs every 6 (six) hours as needed for wheezing or shortness of breath.    amLODipine (NORVASC) 10 MG tablet Take 1 tablet by mouth daily.    atorvastatin (LIPITOR) 20 MG tablet Take 1 tablet (20 mg total) by mouth daily.    famotidine (PEPCID) 20 MG tablet Take 1 tablet (20 mg total) by mouth at bedtime.    Homeopathic Products (RESTFUL LEGS SL) Place 1 tablet under the tongue at bedtime.    hydrochlorothiazide (MICROZIDE) 12.5 MG capsule Take 1 capsule (12.5 mg total) by mouth daily.    ibuprofen (ADVIL) 200 MG tablet Take 200 mg by mouth every 4 (four) hours as needed (patient states that she takes 6 at a time 5 or 6 times a day). Education- will do pharmacy referral    levothyroxine (SYNTHROID) 50 MCG tablet Take 1 tablet (50 mcg total) by mouth daily before breakfast.    losartan (COZAAR) 50 MG tablet Take 1 tablet (50 mg total) by mouth daily.    nystatin cream (MYCOSTATIN) Apply 1 application topically 2 (two) times daily.    predniSONE (DELTASONE)  50 MG tablet Use Oral prednisone 50 mg at  13 hours  7 hours  and  1 hour prior to contrast administration.    spironolactone (ALDACTONE) 25 MG tablet Take 0.5 tablets by mouth daily.    Tiotropium Bromide Monohydrate (SPIRIVA RESPIMAT) 2.5 MCG/ACT AERS Inhale 2  puffs into the lungs daily.    [DISCONTINUED] Liraglutide -Weight Management (SAXENDA) 18 MG/3ML SOPN Inject 0.6 mg into the skin daily. Inject  0.6 mg daily x 1 week  1.2mg  daily x  Week 2 1.8 mg daily x week 3  2.4mg  daily x week 4  3.0 mg daily x week 5 Then continue 3 mg every day    [DISCONTINUED] naproxen (NAPROSYN) 500 MG tablet Take 1 tablet (500 mg total) by mouth 2 (two) times daily as needed. (Patient not taking: Reported on 11/03/2020) 08/05/2020: Makes patient sick to her stomach   [DISCONTINUED] nicotine (NICODERM CQ - DOSED IN MG/24 HOURS) 21 mg/24hr patch APPLY 1 PATCH TOPICALLY ONCE DAILY (Patient not taking: Reported on 11/03/2020)    No facility-administered encounter medications on file as of 11/14/2020.    Patient Active Problem List   Diagnosis Date Noted   Asthma case management patient 09/30/2020   SOB (shortness of breath) 09/30/2020   LUQ pain 09/30/2020   Hypothyroidism 09/30/2020   Polycythemia 09/30/2020   Moderate asthma with exacerbation 09/17/2020   Hypertensive emergency 05/02/2018    Conditions to be addressed/monitored: Anxiety; Mental Health Concerns  and Pain  Care Plan : Clinical Social Work (Adult)  Updates made by Bridgett Larsson, LCSW since 11/17/2020 12:00 AM     Problem: Coping Skills (General Plan of Care)      Long-Range Goal: Coping Skills Enhanced   Start Date: 10/09/2020  This Visit's Progress: On track  Recent Progress: On track  Priority: Medium  Note:   Current barriers:   Acute Mental Health needs related to Anxiety Mental Health Concerns  Needs Support, Education, and Care Coordination in order to meet unmet mental health needs. Clinical Goal(s): Over the next 120 days, patient will work with SW, counselor and therapist to reduce or manage symptoms of agitation, mood instability, stress, and bipolar until connected for ongoing counseling. Clinical Interventions:  Assessed patient's previous and current treatment, coping  skills, support system and barriers to care  Patient interviewed and appropriate assessments performed Patient reports nothing has changed. Patient was informed yesterday that PCP is not going not prescribe any additional weight management medication Patient states I'm going to do me" Frustrated that medical team is unable to find root of pain despite her being compliant with medical appointments that she travels to. Patient had CT completed this week. She was informed that there was nothing found in the CT scan to justify ongoing pain Patient reports adverse side effects from medication PCP prescribed for pain management (Tramadol made her tongue swell and prevented her from eating for two days) Patient reports she obtains hydrocodone from a friend and takes half a pill daily to assist her with walking twice a day and taking care her household Patient verbalized understanding that her weight contributes to her pain, which is why she is motivated to try medications to assist with weight loss. Patient feels like her blood pressure is managed, stating "I check my blood pressure twice a day faithfully" Patient reports that she has smoked 15 cigarettes in two weeks. "I'm doing what I need to do" Patient is requesting a referral to a Nutritionist.  CCM LCSW strongly  encouraged patient to continue utilizing pain management strategies recommended by PCP and Gastrologist. Patient reports she takes Ibuprofen daily CCM LCSW implemented Motivational Interviewing strategies to promote mood. Patient identified ways she will reward self and implement self-care Patient reports difficulty managing symptoms of anxiety triggered by chronic pain. States that she "struggles daily" Patient is also managing extensive grief regarding the passing of her father, step-mom, sister (suicide), grandfather, and close friend passing all within 3 years. Patient denies SI/HI Anxiety symptoms include, feelings of sadness, irritability,  and chronic pain Patient was successful at identifying protective factors that assist with motivation (9 grandchildren "they are my world") She receives strong support from husband and daughter, who both walk with patient regularly to assist in management of health conditions CCM LCSW provided validation and encouragement. Patient was informed of correlation between one's physical and mental health and was commended for establishing an exercise routine, that can assist with both. Patient shared that her goal is to lose weight to assist in the management of diabetes Patient reports frustration that she has modified diet and implemented exercise in her routine with very minimal results. Patient reports that she was eating salad without dressing, walking "as much as my legs would allow" morning and evening, and decreased soda intake (went from drinking 8-10 pepsi's a day to zero for a month) Patient only drinks water and unsweetened tea Patient is concerned about insurance provider not fully covering medications for weight loss. States that PCP has attempted to prescribe two medications that weren't covered and was outside of patient's budget Patient takes medications and get rest when feeling overwhelmed Interventions provided: Motivational Interviewing Strategies, Mindfulness or Management consultant, Active listening / Reflection utilized , Emotional Supportive Provided, and Verbalization of feelings encouraged  ; Discussed plans with patient for ongoing care management follow up and provided patient with direct contact information for care management team Collaboration with PCP regarding development and update of comprehensive plan of care as evidenced by provider attestation and co-signature Inter-disciplinary care team collaboration (see longitudinal plan of care) Patient Goals/Self-Care Activities: Over the next 120 days Attend all scheduled appointments with providers Contact office with any questions  or concerns Continue with compliance of taking medication  Avoid negative self-talk Spend time or talk with others at least 2 to 3 times per week Practice relaxation or meditation daily Practice positive thinking and self-talk         Jenel Lucks, MSW, LCSW [facility] Care Management [place]  [email or web address] Phone [phone] 8:28 AM

## 2020-11-17 NOTE — Patient Instructions (Signed)
Visit Information   Goals Addressed             This Visit's Progress    Anxiety Symptoms Identified   On track    Patient Goals/Self-Care Activities: Over the next 120 days Attend all scheduled appointments with providers Contact office with any questions or concerns Continue with compliance of taking medication  Avoid negative self-talk Spend time or talk with others at least 2 to 3 times per week Practice relaxation or meditation daily Practice positive thinking and self-talk        Patient verbalizes understanding of instructions provided today and agrees to view in MyChart.   Telephone follow up appointment with care management team member scheduled for:01/15/21  Jenel Lucks, MSW, LCSW [facility] Care Management [facility]  [facility] [email or web address] Phone [phone] 8:32 AM

## 2020-12-01 ENCOUNTER — Telehealth: Payer: Self-pay | Admitting: Internal Medicine

## 2020-12-01 NOTE — Telephone Encounter (Signed)
Medication Refill - Medication:  spironolactone (ALDACTONE) 25 MG tablet  Has the patient contacted their pharmacy? No.   Preferred Pharmacy (with phone number or street name):  [facility] [address] [place], [place] - [address] [id number] [address] Phone:  [phone]  Fax:  [phone]      Agent: Please be advised that RX refills may take up to 3 business days. We ask that you follow-up with your pharmacy.

## 2020-12-01 NOTE — Telephone Encounter (Signed)
Requested medication (s) are due for refill today:   Not sure  Requested medication (s) are on the active medication list:   Yes  Future visit scheduled:   No   Last ordered: 09/09/2020 by a historical provider  Returned because prescribed by a historical provider.   Requested Prescriptions  Pending Prescriptions Disp Refills   spironolactone (ALDACTONE) 25 MG tablet      Sig: Take 0.5 tablets (12.5 mg total) by mouth daily.     Cardiovascular: Diuretics - Aldosterone Antagonist Passed - 12/01/2020  1:26 PM      Passed - Cr in normal range and within 360 days    Creatinine, Ser  Date Value Ref Range Status  11/12/2020 1.00 0.44 - 1.00 mg/dL Final          Passed - K in normal range and within 360 days    Potassium  Date Value Ref Range Status  09/30/2020 4.4 3.5 - 5.2 mmol/L Final          Passed - Na in normal range and within 360 days    Sodium  Date Value Ref Range Status  09/30/2020 136 134 - 144 mmol/L Final          Passed - Last BP in normal range    BP Readings from Last 1 Encounters:  11/10/20 127/84          Passed - Valid encounter within last 6 months    Recent Outpatient Visits           3 weeks ago LUQ pain   [facility] Vigg, Avanti, MD   1 month ago Morbid obesity (HCC)   [facility] Vigg, Avanti, MD   2 months ago Asthma case management patient   [facility] Vigg, Avanti, MD   2 months ago Moderate asthma with exacerbation, unspecified whether persistent   [facility] [place], Clydie Braun, NP   2 months ago Thyroid nodule   [facility] Vigg, Avanti, MD       Future Appointments             In 4 weeks Pasty Spillers, MD [facility] [place]

## 2020-12-01 NOTE — Telephone Encounter (Signed)
Patient last seen 11/10/20

## 2020-12-02 ENCOUNTER — Ambulatory Visit: Payer: 59 | Admitting: Gastroenterology

## 2020-12-05 NOTE — Telephone Encounter (Signed)
Since Dr. Charlotta Newton is not here, prefer she get this from her cardiologist

## 2020-12-05 NOTE — Telephone Encounter (Signed)
Called and left a detailed message for patient.  

## 2020-12-05 NOTE — Telephone Encounter (Signed)
Pt states the spironolactone (ALDACTONE) 25 MG tablet was prescribed by her heart dr. But she would prefer Dr Charlotta Newton write this for her. She states Dr Charlotta Newton tried her on hydrochlorothiazide , however that caused pt cramping.  Pt she prefers states the spironolactone.  Pt is out of this med, and needs It sent asap because she is beginning to swell.  [facility] [address] [place], [place] - [address] [address] [phone] [address]

## 2020-12-09 ENCOUNTER — Encounter: Payer: Self-pay | Admitting: Internal Medicine

## 2020-12-09 ENCOUNTER — Other Ambulatory Visit: Payer: Self-pay

## 2020-12-09 MED ORDER — SPIRONOLACTONE 25 MG PO TABS: 12.5000 mg | ORAL_TABLET | Freq: Every day | ORAL | 0 refills | Status: DC

## 2020-12-09 NOTE — Telephone Encounter (Signed)
Patient is requesting a refill of her Spironolactone 25 mg take 0.5mg  daily.  Last ov 11/10/20  Up coming appt. no

## 2020-12-12 ENCOUNTER — Ambulatory Visit: Payer: 59 | Admitting: Internal Medicine

## 2020-12-26 ENCOUNTER — Other Ambulatory Visit: Payer: Self-pay

## 2020-12-26 MED ORDER — SPIRONOLACTONE 25 MG PO TABS: 12.5000 mg | ORAL_TABLET | Freq: Every day | ORAL | 0 refills | Status: DC

## 2020-12-29 ENCOUNTER — Ambulatory Visit: Payer: 59 | Admitting: Gastroenterology

## 2021-01-02 ENCOUNTER — Other Ambulatory Visit: Payer: Self-pay | Admitting: Nurse Practitioner

## 2021-01-02 ENCOUNTER — Ambulatory Visit (INDEPENDENT_AMBULATORY_CARE_PROVIDER_SITE_OTHER): Payer: 59 | Admitting: Endocrinology

## 2021-01-02 ENCOUNTER — Other Ambulatory Visit: Payer: Self-pay | Admitting: Internal Medicine

## 2021-01-02 ENCOUNTER — Other Ambulatory Visit (INDEPENDENT_AMBULATORY_CARE_PROVIDER_SITE_OTHER): Payer: 59

## 2021-01-02 ENCOUNTER — Encounter: Payer: Self-pay | Admitting: Endocrinology

## 2021-01-02 ENCOUNTER — Other Ambulatory Visit: Payer: Self-pay

## 2021-01-02 VITALS — BP 130/72 | HR 84 | Ht 62.8 in | Wt 234.6 lb

## 2021-01-02 DIAGNOSIS — I10 Essential (primary) hypertension: Secondary | ICD-10-CM | POA: Diagnosis not present

## 2021-01-02 DIAGNOSIS — L0291 Cutaneous abscess, unspecified: Secondary | ICD-10-CM

## 2021-01-02 DIAGNOSIS — E039 Hypothyroidism, unspecified: Secondary | ICD-10-CM

## 2021-01-02 DIAGNOSIS — R252 Cramp and spasm: Secondary | ICD-10-CM | POA: Insufficient documentation

## 2021-01-02 LAB — BASIC METABOLIC PANEL
BUN: 12 mg/dL (ref 6–23)
CO2: 26 mEq/L (ref 19–32)
Calcium: 8.6 mg/dL (ref 8.4–10.5)
Chloride: 102 mEq/L (ref 96–112)
Creatinine, Ser: 1.1 mg/dL (ref 0.40–1.20)
GFR: 57.13 mL/min — ABNORMAL LOW (ref >60.00)
Glucose, Bld: 111 mg/dL — ABNORMAL HIGH (ref 70–99)
Potassium: 3.9 mEq/L (ref 3.5–5.1)
Sodium: 137 mEq/L (ref 135–145)

## 2021-01-02 LAB — TSH: TSH: 4.28 u[IU]/mL (ref 0.35–5.50)

## 2021-01-02 LAB — T4, FREE: Free T4: 1.08 ng/dL (ref 0.60–1.60)

## 2021-01-02 LAB — VITAMIN D 25 HYDROXY (VIT D DEFICIENCY, FRACTURES): VITD: 16.38 ng/mL — ABNORMAL LOW (ref 30.00–100.00)

## 2021-01-02 NOTE — Progress Notes (Signed)
Subjective:    Patient ID: Jodi Keith, female    DOB: [birth year], 55 y.o.   MRN: [id number]  HPI Pt is referred by Dr Charlotta Newton, for nodular thyroid.  Pt was noted to have a thyroid nodule in 2022.  She has had hypothyroidism since 2018.  She started taking synthroid early 2022.  she has no h/o XRT or surgery to the neck.  She reports weight gain, mood swings, dry skin, cold intolerance, and fatigue.   Past Medical History:  Diagnosis Date   Arthritis    Asthma    CHF (congestive heart failure) (HCC)    Hypertension     Past Surgical History:  Procedure Laterality Date   APPENDECTOMY     CHOLECYSTECTOMY     TUBAL LIGATION      Social History   Socioeconomic History   Marital status: Married    Spouse name: Not on file   Number of children: Not on file   Years of education: Not on file   Highest education level: Not on file  Occupational History   Not on file  Tobacco Use   Smoking status: Every Day    Packs/day: 1.00    Types: Cigarettes   Smokeless tobacco: Never  Vaping Use   Vaping Use: Never used  Substance and Sexual Activity   Alcohol use: Never   Drug use: Never   Sexual activity: Yes  Other Topics Concern   Not on file  Social History Narrative   Not on file   Social Determinants of Health   Financial Resource Strain: Low Risk    Difficulty of Paying Living Expenses: Not very hard  Food Insecurity: No Food Insecurity   Worried About Running Out of Food in the Last Year: Never true   Ran Out of Food in the Last Year: Never true  Transportation Needs: No Transportation Needs   Lack of Transportation (Medical): No   Lack of Transportation (Non-Medical): No  Physical Activity: Sufficiently Active   Days of Exercise per Week: 6 days   Minutes of Exercise per Session: 60 min  Stress: Stress Concern Present   Feeling of Stress : To some extent  Social Connections: Moderately Isolated   Frequency of Communication with Friends and Family: More than  three times a week   Frequency of Social Gatherings with Friends and Family: More than three times a week   Attends Religious Services: Never   Database administrator or Organizations: No   Attends Engineer, structural: Never   Marital Status: Married  Catering manager Violence: Not At Risk   Fear of Current or Ex-Partner: No   Emotionally Abused: No   Physically Abused: No   Sexually Abused: No    Current Outpatient Medications on File Prior to Visit  Medication Sig Dispense Refill   amLODipine (NORVASC) 10 MG tablet Take 1 tablet by mouth daily.     famotidine (PEPCID) 20 MG tablet Take 1 tablet (20 mg total) by mouth at bedtime. 30 tablet 3   Homeopathic Products (RESTFUL LEGS SL) Place 1 tablet under the tongue at bedtime.     hydrochlorothiazide (MICROZIDE) 12.5 MG capsule Take 1 capsule (12.5 mg total) by mouth daily. 30 capsule 3   ibuprofen (ADVIL) 200 MG tablet Take 200 mg by mouth every 4 (four) hours as needed (patient states that she takes 6 at a time 5 or 6 times a day). Education- will do pharmacy referral     levothyroxine (SYNTHROID)  50 MCG tablet Take 1 tablet (50 mcg total) by mouth daily before breakfast. 30 tablet 4   nystatin cream (MYCOSTATIN) Apply 1 application topically 2 (two) times daily. 30 g 1   predniSONE (DELTASONE) 50 MG tablet Use Oral prednisone 50 mg at  13 hours  7 hours  and  1 hour prior to contrast administration. 3 tablet 0   spironolactone (ALDACTONE) 25 MG tablet Take 0.5 tablets (12.5 mg total) by mouth daily. 15 tablet 0   Tiotropium Bromide Monohydrate (SPIRIVA RESPIMAT) 2.5 MCG/ACT AERS Inhale 2 puffs into the lungs daily. 1 each 5   losartan (COZAAR) 50 MG tablet Take 1 tablet (50 mg total) by mouth daily. 30 tablet 3   No current facility-administered medications on file prior to visit.    Allergies  Allergen Reactions   Prochlorperazine Edisylate Anaphylaxis    coma   Contrast Media [Iodinated Diagnostic Agents]  Palpitations and Other (See Comments)    Patient had severe hypotension, rapid heart beat after initial contrast injection "years ago." For recent procedure using IV contrast patient had appropriate 13 hour premedication.     Family History  Problem Relation Age of Onset   Thyroid disease Mother    Non-Hodgkin's lymphoma Mother    Lung cancer Mother    Thyroid cancer Mother    Esophageal cancer Mother    Stomach cancer Mother    Ovarian cancer Mother    Skin cancer Mother    Heart failure Mother    Heart disease Mother    Hypertension Mother    Stroke Mother    Cancer Mother    Arthritis Mother    Asthma Mother    COPD Mother    Early death Mother    Heart attack Father    Diabetes Father    Kidney disease Father    Heart disease Father    Hypertension Father    Heart failure Father    Thyroid disease Sister    Anxiety disorder Sister    Depression Sister    Panic disorder Sister    Diabetes Sister    Suicidality Sister    Early death Sister    Diabetes Brother    Other Brother        drug abuse   Heart disease Maternal Grandmother    Stroke Maternal Grandmother    Cancer Maternal Grandfather    Heart disease Maternal Grandfather    Stroke Maternal Grandfather    Alzheimer's disease Paternal Grandmother    Diabetes Paternal Grandmother    Hypertension Paternal Grandmother    Breast cancer Paternal Grandmother 40   Heart disease Paternal Grandfather    Breast cancer Maternal Aunt 60    BP 130/72 (BP Location: Right Arm, Patient Position: Sitting, Cuff Size: Large)   Pulse 84   Ht 5' 2.8" (1.595 m)   Wt 234 lb 9.6 oz (106.4 kg)   LMP  (LMP Unknown)   SpO2 96%   BMI 41.82 kg/m    Review of Systems denies memory loss and constipation.  She has muscle cramps.     Objective:   Physical Exam VS: see vs page GEN: no distress HEAD: head: no deformity eyes: no periorbital swelling, no proptosis external nose and ears are normal NECK: supple, thyroid is  not enlarged CHEST WALL: no deformity LUNGS: clear to auscultation CV: reg rate and rhythm, no murmur.  MUSCULOSKELETAL: gait is normal and steady EXTEMITIES: no deformity.  1+ bilat leg edema NEURO:  readily moves  all 4's.  sensation is intact to touch on all 4's SKIN:  Normal texture and temperature.  No rash or suspicious lesion is visible.   NODES:  None palpable at the neck PSYCH: alert, well-oriented.  Does not appear anxious nor depressed.  [place] (2022): Atrophic and moderately heterogeneous appearing thyroid, nonspecific though could be seen in the setting of a chronic thyroiditis. 2. Ill-defined nodule/pseudonodule labeled #1 meets imaging criteria to recommend a 1 year follow-up.   I have reviewed outside records, and summarized: Pt was noted to have abnormal TFT, and referred here.  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  Please continue the same synthroid  Patient Instructions  Blood tests are requested for you today.  We'll let you know about the results.  We should plan to recheck the ultrasound next year.   Please come back for a follow-up appointment in 6 months.

## 2021-01-02 NOTE — Telephone Encounter (Signed)
Requested medication (s) are due for refill today - yes  Requested medication (s) are on the active medication list -yes  Future visit scheduled -no  Last refill: 09/16/20  Notes to clinic: Request RF: medication not assigned protocol  Requested Prescriptions  Pending Prescriptions Disp Refills   nystatin cream (MYCOSTATIN) [Pharmacy Med Name: Nystatin 100000 UNIT/GM External Cream] 30 g 0    Sig: APPLY CREAM TOPICALLY TWICE DAILY     Off-Protocol Failed - 01/02/2021 12:27 PM      Failed - Medication not assigned to a protocol, review manually.      Passed - Valid encounter within last 12 months    Recent Outpatient Visits           1 month ago LUQ pain   [facility] Vigg, Avanti, MD   2 months ago Morbid obesity (HCC)   [facility] Vigg, Avanti, MD   3 months ago Asthma case management patient   [facility] Vigg, Avanti, MD   3 months ago Moderate asthma with exacerbation, unspecified whether persistent   [facility] Larae Grooms, NP   3 months ago Thyroid nodule   [facility] Vigg, Avanti, MD                 Requested Prescriptions  Pending Prescriptions Disp Refills   nystatin cream (MYCOSTATIN) [Pharmacy Med Name: Nystatin 100000 UNIT/GM External Cream] 30 g 0    Sig: APPLY CREAM TOPICALLY TWICE DAILY     Off-Protocol Failed - 01/02/2021 12:27 PM      Failed - Medication not assigned to a protocol, review manually.      Passed - Valid encounter within last 12 months    Recent Outpatient Visits           1 month ago LUQ pain   [facility] Vigg, Avanti, MD   2 months ago Morbid obesity (HCC)   [facility] Vigg, Avanti, MD   3 months ago Asthma case management patient   [facility] Vigg, Avanti, MD   3 months ago Moderate asthma with exacerbation, unspecified whether persistent   [facility] Larae Grooms, NP   3 months ago Thyroid  nodule   [facility] Vigg, Roma Schanz, MD

## 2021-01-02 NOTE — Patient Instructions (Addendum)
Blood tests are requested for you today.  We'll let you know about the results.  We should plan to recheck the ultrasound next year.   Please come back for a follow-up appointment in 6 months.

## 2021-01-03 NOTE — Telephone Encounter (Signed)
Requested Prescriptions  Pending Prescriptions Disp Refills  . atorvastatin (LIPITOR) 20 MG tablet [Pharmacy Med Name: Atorvastatin Calcium 20 MG Oral Tablet] 30 tablet 0    Sig: Take 1 tablet by mouth once daily     Cardiovascular:  Antilipid - Statins Failed - 01/02/2021  5:30 PM      Failed - HDL in normal range and within 360 days    HDL  Date Value Ref Range Status  09/30/2020 36 (L) >39 mg/dL Final         Failed - Triglycerides in normal range and within 360 days    Triglycerides  Date Value Ref Range Status  09/30/2020 295 (H) 0 - 149 mg/dL Final         Passed - Total Cholesterol in normal range and within 360 days    Cholesterol, Total  Date Value Ref Range Status  09/30/2020 182 100 - 199 mg/dL Final         Passed - LDL in normal range and within 360 days    LDL Chol Calc (NIH)  Date Value Ref Range Status  09/30/2020 96 0 - 99 mg/dL Final         Passed - Patient is not pregnant      Passed - Valid encounter within last 12 months    Recent Outpatient Visits          1 month ago LUQ pain   [facility] Vigg, Avanti, MD   2 months ago Morbid obesity (HCC)   [facility] Vigg, Avanti, MD   3 months ago Asthma case management patient   [facility] Vigg, Avanti, MD   3 months ago Moderate asthma with exacerbation, unspecified whether persistent   [facility] [place], Clydie Braun, NP   3 months ago Thyroid nodule   [facility] Vigg, Avanti, MD

## 2021-01-05 LAB — PTH, INTACT AND CALCIUM
Calcium: 8.9 mg/dL (ref 8.6–10.4)
PTH: 57 pg/mL (ref 16–77)

## 2021-01-06 ENCOUNTER — Ambulatory Visit: Payer: Self-pay

## 2021-01-06 ENCOUNTER — Telehealth: Payer: 59 | Admitting: General Practice

## 2021-01-06 DIAGNOSIS — F419 Anxiety disorder, unspecified: Secondary | ICD-10-CM

## 2021-01-06 DIAGNOSIS — F4321 Adjustment disorder with depressed mood: Secondary | ICD-10-CM

## 2021-01-06 DIAGNOSIS — I10 Essential (primary) hypertension: Secondary | ICD-10-CM

## 2021-01-06 DIAGNOSIS — W19XXXA Unspecified fall, initial encounter: Secondary | ICD-10-CM

## 2021-01-06 DIAGNOSIS — J45909 Unspecified asthma, uncomplicated: Secondary | ICD-10-CM

## 2021-01-06 DIAGNOSIS — J45901 Unspecified asthma with (acute) exacerbation: Secondary | ICD-10-CM

## 2021-01-06 DIAGNOSIS — R1012 Left upper quadrant pain: Secondary | ICD-10-CM

## 2021-01-06 DIAGNOSIS — F17200 Nicotine dependence, unspecified, uncomplicated: Secondary | ICD-10-CM

## 2021-01-06 NOTE — Chronic Care Management (AMB) (Signed)
Care Management    RN Visit Note  01/06/2021 Name: Jodi Keith MRN: [id number] DOB: 1966-03-16  Subjective: Jodi Keith is a 55 y.o. year old female who is a primary care Keith of Vigg, Avanti, MD. Jodi care management team was consulted for assistance with disease management and care coordination needs.    Engaged with Keith by telephone for follow up visit in response to provider referral for case management and/or care coordination services.   Consent to Services:   Jodi Keith was given information about Care Management services today including:  Care Management services includes personalized support from designated clinical staff supervised by her physician, including individualized plan of care and coordination with other care providers 24/7 contact phone numbers for assistance for urgent and routine care needs. Jodi Keith may stop case management services at any time by phone call to Jodi office staff.  Keith agreed to services and consent obtained.   Assessment: Review of Keith past medical history, allergies, medications, health status, including review of consultants reports, laboratory and other test data, was performed as part of comprehensive evaluation and provision of chronic care management services.   SDOH (Social Determinants of Health) assessments and interventions performed:    Care Plan  Allergies  Allergen Reactions   Prochlorperazine Edisylate Anaphylaxis    coma   Contrast Media [Iodinated Diagnostic Agents] Palpitations and Other (See Comments)    Keith had severe hypotension, rapid heart beat after initial contrast injection "years ago." For recent procedure using IV contrast Keith had appropriate 13 hour premedication.     Outpatient Encounter Medications as of 01/06/2021  Medication Sig   albuterol (VENTOLIN HFA) 108 (90 Base) MCG/ACT inhaler INHALE 2 PUFFS BY MOUTH EVERY 6 HOURS AS NEEDED FOR WHEEZING FOR SHORTNESS OF BREATH    amLODipine (NORVASC) 10 MG tablet Take 1 tablet by mouth daily.   atorvastatin (LIPITOR) 20 MG tablet Take 1 tablet by mouth once daily   famotidine (PEPCID) 20 MG tablet Take 1 tablet (20 mg total) by mouth at bedtime.   Homeopathic Products (RESTFUL LEGS SL) Place 1 tablet under Jodi tongue at bedtime.   hydrochlorothiazide (MICROZIDE) 12.5 MG capsule Take 1 capsule (12.5 mg total) by mouth daily.   ibuprofen (ADVIL) 200 MG tablet Take 200 mg by mouth every 4 (four) hours as needed (Keith states that she takes 6 at a time 5 or 6 times a day). Education- will do pharmacy referral   levothyroxine (SYNTHROID) 50 MCG tablet Take 1 tablet (50 mcg total) by mouth daily before breakfast.   losartan (COZAAR) 50 MG tablet Take 1 tablet (50 mg total) by mouth daily.   nystatin cream (MYCOSTATIN) APPLY CREAM TOPICALLY TWICE DAILY   predniSONE (DELTASONE) 50 MG tablet Use Oral prednisone 50 mg at  13 hours  7 hours  and  1 hour prior to contrast administration.   spironolactone (ALDACTONE) 25 MG tablet Take 0.5 tablets (12.5 mg total) by mouth daily.   Tiotropium Bromide Monohydrate (SPIRIVA RESPIMAT) 2.5 MCG/ACT AERS Inhale 2 puffs into Jodi lungs daily.   No facility-administered encounter medications on file as of 01/06/2021.    Keith Active Problem List   Diagnosis Date Noted   HTN (hypertension) 01/02/2021   Cramp in limb 01/02/2021   Asthma case management Keith 09/30/2020   SOB (shortness of breath) 09/30/2020   LUQ pain 09/30/2020   Hypothyroidism 09/30/2020   Polycythemia 09/30/2020   Moderate asthma with exacerbation 09/17/2020   Hypertensive emergency 05/02/2018  Conditions to be addressed/monitored: HTN, Anxiety, Depression, Pulmonary Disease, and Chronic pain, and fall prevention and safety, and a smoker.  Care Plan : RNCM: Fall Risk (Adult)  Updates made by Vanita Ingles, RN since 01/06/2021 12:00 AM     Problem: RNCM: Fall Risk   Priority: High     Long-Range  Goal: RNCM: Absence of Fall and Fall-Related Injury   Start Date: 08/26/2020  Expected End Date: 07/22/2021  This Visit's Progress: On track  Recent Progress: On track  Priority: High  Note:    Current Barriers:  Knowledge Deficits related to fall precautions in Keith with chronic pain and weakness in legs, causing falls at times  Decreased adherence to prescribed treatment for fall prevention Unable to independently manage safety in Jodi home as evidence of fall over Jodi past weekend due to leg weakness and giving away Does not contact provider office for questions/concerns Knowledge Deficits related to fall prevention and safety  Chronic Disease Management support and education needs related to preventing falls in a Keith with multiple chronic conditions with recent fall without injury  Clinical Goal(s):  Keith will demonstrate improved adherence to prescribed treatment plan for decreasing falls as evidenced by Keith reporting and review of EMR Keith will verbalize using fall risk reduction strategies discussed Keith will not experience additional falls Keith will verbalize understanding of plan for effective management of falls prevention and safety Keith will work with RNCM, pcp, and CCM team  to address needs related to falls and safety in Jodi patients environment  Keith will attend all scheduled medical appointments: Encouraged Jodi Keith to call and make an appointment for follow up with Jodi pcp.  Keith will demonstrate improved adherence to prescribed treatment plan for falls prevention and safety  as evidenced byno new falls, being safe in her environment and working with Jodi CCM team and pcp to optimize health and well being.  Interventions:  Collaboration with Charlynne Cousins, MD regarding development and update of comprehensive plan of care as evidenced by provider attestation and co-signature Inter-disciplinary care team collaboration (see longitudinal plan of  care) Provided written and verbal education re: Potential causes of falls and Fall prevention strategies Reviewed medications and discussed potential side effects of medications such as dizziness and frequent urination. 01-06-2021: Review of medications and Jodi Keith is mindful of medications that may cause potential fall risk.  Assessed for s/s of orthostatic hypotension. 11-04-2020: Blood pressures around 119/80 previously in Jodi office. Jodi Keith denies any light-headedness or dizziness. Will continue to monitor for changes. Knows to change position slowly. 01-06-2021: No episodes of hypotension at this time. Assessed for falls since last encounter. Fall over Jodi weekend of May 7th. 01-06-2021: Denies any new falls.  Assessed patients knowledge of fall risk prevention secondary to previously provided education. 11-04-2020: Education and support, Jodi Keith denies any new concerns with safety. Actually is walking at least 30 minutes BID and feels much better. States today is a "good day". 01-06-2021: Jodi Keith was not having a good day. States that she has been in bed over a month and only gets up to use Jodi bathroom and eat and goes back to bed. Denies any issues with falls at this time.  Assessed working status of life alert bracelet and Keith adherence Provided Keith information for fall alert systems Evaluation of current treatment plan related to falls prevention and safety and Keith's adherence to plan as established by provider. 01-06-2021: Jodi Keith is consistently working with Jodi pcp, specialist  and CCM team to manage her health and well being. She denies any new falls and is hopeful that she will soon know what is causing her health concerns.  Advised Keith to call Jodi office for new falls, for concerns, or questions  Provided education to Keith re: being safe in her surroundings and monitoring triggers that cause falls. Jodi Keith states that she often falls because her legs give  away and cause her to fall. Left leg weakness with last fall over Jodi weekend. 11-04-2020: Praised Jodi Keith for increasing her activity level and building her muscle mass.  Encouraged her to keep walking daily. She also has been swimming with her grandchildren and this makes her happy. She states during Jodi 4th of July her and her husband went to [employer] and this was an enjoyable time for her. 01-06-2021: Jodi Keith was not having Jodi best of days today. She states Jodi grandchildren have gone back to school and she just is not feeling Jodi best. States she has no energy. Encouraged Jodi Keith to get up and schedule some things to do.  Reviewed medications with Keith and discussed compliance. 01-06-2021: Jodi Keith states compliance with medications.  Provided Keith with falls prevention and safety educational materials related to preventing falls through Jodi [facility] and my chart system Reviewed scheduled/upcoming provider appointments including: Jodi Keith has no upcoming appointments, encouraged Jodi Keith to get a follow up appointment with Dr. Neomia Dear scheduled Discussed plans with Keith for ongoing care management follow up and provided Keith with direct contact information for care management team Self-Care Deficits:  Unable to independently manage safety as evidence of fall over Jodi weekend, without injury Does not attend all scheduled provider appointments Does not adhere to prescribed medication regimen Does not maintain contact with provider office Does not contact provider office for questions/concerns Keith Goals:  - Utilize safety appropriately with all ambulation- Jodi Keith currently does not use any DME for ambulation  - De-clutter walkways - Change positions slowly - Wear secure fitting shoes at all times with ambulation - Utilize home lighting for dim lit areas - Demonstrate self and pet awareness at all times - activities of daily living skills assessed - barriers  to physical activity or exercise addressed - barriers to physical activity or exercise identified - barriers to safety identified - cognition assessed - cognitive-stimulating activities promoted - fall prevention plan reviewed and updated - fear of falling, loss of independence and pain acknowledged - medication list reviewed - modification of home and work environment promoted - vision and/or hearing aid use promoted Follow Up Plan: Telephone follow up appointment with care management team member scheduled for: 03-11-2021 at 1030 am    Care Plan : RNCM: Chronic Pain (Adult)- bilateral legs and left side  Updates made by Vanita Ingles, RN since 01/06/2021 12:00 AM     Problem: RNCM; Pain Management Plan (Chronic Pain) bilateral legs and left side   Priority: High     Long-Range Goal: RNCM: Pain Management Plan Developed   Start Date: 08/26/2020  Expected End Date: 10/30/2021  This Visit's Progress: On track  Recent Progress: On track  Priority: High  Note:   Current Barriers:  Knowledge Deficits related to managing acute/chronic pain Non-adherence to scheduled provider appointments Non-adherence to prescribed medication regimen Difficulty obtaining medications Chronic Disease Management support and education needs related to chronic pain Unable to independently manage pain and discomfort to bilateral legs and left side  Does not adhere to prescribed medication  regimen Does not contact provider office for questions/concerns Nurse Case Manager Clinical Goal(s):  Keith will verbalize understanding of plan for managing pain Keith will attend all scheduled medical appointments: Encouraged Jodi Keith to get a follow up with Dr. Neomia Dear, Jodi Keith to call Jodi office  Keith will demonstrate use of different relaxation  skills and/or diversional activities to assist with pain reduction (distraction, imagery, relaxation, massage, acupressure, TENS, heat, and cold  application Keith will report pain at a level less than 3 to 4 on a 10-10 rating scale-01-06-2021: States today is a bad day, rates her pain level at a 9 and worse when she is eating.  Keith will use pharmacological and nonpharmacological pain relief strategies Keith will verbalize acceptable level of pain relief and ability to engage in desired activities Keith will engage in desired activities without an increase in pain level Interventions:  Collaboration with Vigg, Avanti, MD regarding development and update of comprehensive plan of care as evidenced by provider attestation and co-signature Inter-disciplinary care team collaboration (see longitudinal plan of care) - deep breathing, relaxation and mindfulness use promoted - effectiveness of pharmacologic therapy monitored - medication-induced side effects managed - misuse of pain medication assessed - motivation and barriers to change assessed and addressed - mutually acceptable comfort goal set - pain assessed - pain treatment goals reviewed Evaluation of current treatment plan related to pain and Keith's adherence to plan as established by provider. 09-09-2020: Jodi  Keith called and sent a message through [place] that she wanted to talk to "Nurse Pam".  Call made to Jodi Keith and she was concerned about Jodi new thyroid medications that pcp placed her on today and Jodi side effects.  Jodi Keith states she is frustrated that her pain is not being addressed. Empathetic listening and support given. Advised Jodi Keith to talk to Jodi pcp about her concerns and frustrations. Also advised Jodi Keith to take Jodi medication for her thyroid and give it an honest try. Jodi Keith verbalized understanding. 11-04-2020: Jodi Keith has had multiple test since Jodi last outreach. She was supposed to have a CT test yesterday but they would not do it because she has had a reaction to dye in Jodi past and they want Jodi pcp to prescribe prednisone before  giving Jodi Keith IV dye contrast. Jodi radiology department is supposed to call Jodi pcp for orders. Jodi Keith did see OB/GYN and it was determined that Jodi cyst she has on her right ovary is not Jodi cause of her pain. Jodi Keith is hopeful that all Jodi testing they are doing will reveal Jodi source of her pain. She is having a good day today. 01-06-2021; Jodi Keith is not having a good day today. Rates pain at a 9 on a scale of 0-10. Says CT test shows nothing for Jodi reason she is having pain or discomfort. Jodi Keith states that she has been in bed for over a month and only gets up to go to Jodi bathroom and eat. She does soak in a hot tub about 3 times a night to help with Jodi cramps in her legs. She was going to cancel her provider visit yesterday but her husband made her go. She states he Vitamin D level is low and he has recommended she start taking Vitamin D 5000 units. Jodi Keith has some and will start taking. Encouraged Jodi Keith to make a follow up with Jodi pcp for evaluation and recommendations.  Advised Keith to call Jodi office for  changes in level or intensity of pain and discomfort, take medications as directed and work with Jodi CCM team to optimize a plan of care for effective management of pain and discomfort.  Provided education to Keith re: taking Ibuprofen as prescribed, discussing her pain concerns with Jodi pcp. Jodi Keith states she has tried to talk to Jodi pcp about her pain but feels Jodi pcp is not listening. She says she is having everything done but Jodi pain being addressed. Reflective listening with education about finding out Jodi root issues of her. 09-09-2020: Review with Jodi Keith and Jodi Keith does not feel her concerns are being addressed. 11-04-2020: Jodi Keith states she is at a much better place today than she has been. She says today is a "good day". Recommended Jodi Keith write down questions to ask Jodi pcp at upcoming appointment on 11-10-2020. Will continue to  monitor for changes. 01-06-2021: Jodi Keith was not having a good day. Is feeling down and out. Empathetic listening and support given. Jodi Keith states she doesn't understand why she feels so bad. Explained that her levels being out of range could cause some of Jodi issues. Encouraged her to write down questions to ask Jodi provider and work with Jodi CCM tam to optimize health and well being.  Reviewed medications with Keith and discussed Jodi concern of Jodi Keith taking Ibuprofen for pain relief and taking 6 pills at one time multiple times a day. Jodi Keith states that is Jodi only relief she can get. Has Naproxen but states that does not help her and makes her stomach hurt. Explained Jodi effects of Ibuprofen on Jodi body systems and Jodi need to take as directed. Will discuss with Jodi pcp and pharm D. 11-04-2020: Jodi Keith states compliance with her medications regimen at this time. Denies any issues with medications. Is working with pcp and insurance to try and get Ozempic approved for her for weight loss as she is frustrated she can not lose weight. 01-06-2021: Is compliant with medications.  Discussed plans with Keith for ongoing care management follow up and provided Keith with direct contact information for care management team Allow Keith to maintain a diary of pain ratings, timing, precipitating events, medications, treatments, and what works best to relieve pain,  Refer to support groups and self-help groups Educate Keith about Jodi use of pharmacological interventions for pain management- antianxiety, antidepressants, NSAIDS, opioid analgesics,  Explain Jodi importance of lifestyle modifications to effective pain management  Keith Goals/Self Care Activities:  - mutually acceptable comfort goal set - pain assessed - pain management plan developed - pain treatment goals reviewed - Keith response to treatment assessed - sharing of pain management plan with teachers and other  caregivers encouraged Self-administers medications as prescribed Attends all scheduled provider appointments Calls pharmacy for medication refills Calls provider office for new concerns or questions Follow Up Plan: Telephone follow up appointment with care management team member scheduled for: 03-11-2021 2022 at 29 am        Care Plan : RNCM; Hypertension (Adult)  Updates made by Vanita Ingles, RN since 01/06/2021 12:00 AM     Problem: RNCM: Hypertension (Hypertension)   Priority: Medium     Long-Range Goal: RNCM; Hypertension Monitored   Start Date: 08/26/2020  Expected End Date: 08/26/2021  This Visit's Progress: On track  Recent Progress: On track  Priority: Medium  Note:   Objective:  Last practice recorded BP readings:  BP Readings from Last 3 Encounters:  01/02/21 130/72  11/10/20 127/84  11/03/20 122/79    Most recent eGFR/CrCl:  Lab Results  Component Value Date   EGFR 51 (L) 09/30/2020    No components found for: CRCL Current Barriers:  Knowledge Deficits related to basic understanding of hypertension pathophysiology and self care management Knowledge Deficits related to understanding of medications prescribed for management of hypertension Non-adherence to prescribed medication regimen Does not adhere to prescribed medication regimen Does not contact provider office for questions/concerns Case Manager Clinical Goal(s):  Keith will verbalize understanding of plan for hypertension management Keith will attend all scheduled medical appointments: Encouraged Jodi Keith to call and get an appointment with Jodi pcp for follow up Keith will demonstrate improved adherence to prescribed treatment plan for hypertension as evidenced by taking all medications as prescribed, monitoring and recording blood pressure as directed, adhering to low sodium/DASH diet Keith will demonstrate improved health management independence as evidenced by checking blood pressure as  directed and notifying PCP if SBP>160 or DBP > 90, taking all medications as prescribe, and adhering to a low sodium diet as discussed. Keith will verbalize basic understanding of hypertension disease process and self health management plan as evidenced by compliance with heart healthy diet, compliance with medications, and working with Jodi CCM team to manage health and well being Interventions:  Collaboration with Vigg, Avanti, MD regarding development and update of comprehensive plan of care as evidenced by provider attestation and co-signature Inter-disciplinary care team collaboration (see longitudinal plan of care) Evaluation of current treatment plan related to hypertension self management and Keith's adherence to plan as established by provider. Is having swelling in her feet and legs. Scheduled for stress test tomorrow for evaluation. Education on elevation of legs. She does not like to wear anything on her legs, not even pants touching them. Review of ways to help with edema in legs and feet. 11-04-2020: Jodi Keith is doing well with managing her blood pressure. Jodi Keith states that she is thankful that finally she is getting some answers. Will continue to monitor for changes. 01-06-2021: Jodi Keith is stable with blood pressure, no issues related to HTN health.  Provided education to Keith re: stroke prevention, s/s of heart attack and stroke, DASH diet, complications of uncontrolled blood pressure Reviewed medications with Keith and discussed importance of compliance. 01-06-2021: Jodi Keith is compliant with medications.  Discussed plans with Keith for ongoing care management follow up and provided Keith with direct contact information for care management team Advised Keith, providing education and rationale, to monitor blood pressure daily and record, calling PCP for findings outside established parameters.  Reviewed scheduled/upcoming provider appointments including:  Encouraged Jodi Keith to call and get an appointment for follow up with pcp Self-Care Activities: - Self administers medications as prescribed Attends all scheduled provider appointments Calls provider office for new concerns, questions, or BP outside discussed parameters Checks BP and records as discussed Follows a low sodium diet/DASH diet Keith Goals: - check blood pressure weekly - choose a place to take my blood pressure (home, clinic or office, retail store) - write blood pressure results in a log or diary - agree on reward when goals are met - agree to work together to make changes - ask questions to understand - have a family meeting to talk about healthy habits - learn about high blood pressure  - blood pressure trends reviewed - depression screen reviewed - home or ambulatory blood pressure monitoring encouraged Follow Up Plan: Telephone follow up appointment with care management team member  scheduled for: 03-11-2021 at 1030 am    Care Plan : RNCM: Asthma (Adult)  Updates made by Vanita Ingles, RN since 01/06/2021 12:00 AM     Problem: RNCM: Disease Progression (Asthma)   Priority: Medium     Long-Range Goal: Disease Progression Prevented or Minimized   Start Date: 08/26/2020  Expected End Date: 08/26/2021  This Visit's Progress: On track  Recent Progress: On track  Priority: Medium  Note:   Current Barriers:  Knowledge deficits related to basic understanding of asthma disease process Knowledge deficits related to basic asthma  self care/management Knowledge deficit related to importance of energy conservation Unable to independently manage asthma exacerbations  Does not maintain contact with provider office Does not contact provider office for questions/concerns  Case Manager Clinical Goal(s): Keith will report utilizing pursed lip breathing for shortness of breath Keith will verbalize understanding of asthma action plan and when to seek appropriate  levels of medical care Keith will engage in lite exercise as tolerated to build/regain stamina and strength and reduce shortness of breath through activity tolerance Keith will verbalize basic understanding of asthma disease process and self care activities  Interventions:  Collaboration with Vigg, Avanti, MD regarding development and update of comprehensive plan of care as evidenced by provider attestation and co-signature Inter-disciplinary care team collaboration (see longitudinal plan of care) UNABLE to independently: manage asthma during periods of exacerbation  Provided Keith with basic written and verbal asthma education on self care/management/and exacerbation prevention. Jodi Keith states that she needs a refill for her albuterol as Jodi one she has is outdated. She states she has had asthma for a long time that started in her childhood. She only uses her inhaler when she needs it. Will touch base with pcp and collaborate with patients expressed needs. Explained to Jodi Keith that she may need to be evaluated by Jodi pcp before an inhaler could be prescribed. Also advised Jodi Keith about smoking causing exacerbation. See care plan for smoking cessation. 11-04-2020: Jodi Keith is pacing activity and not going out in Jodi heat of Jodi day. [place] as it was cooler there and nice outside. She denies any exacerbations with her asthma, also working on smoking cessation. Will continue to monitor for changes. 01-06-2021: Jodi Keith states that she is breathing good. Denies any new concerns with her asthma.  Provided Keith with asthma action plan and reinforced importance of daily self assessment Provided instruction about proper use of medications used for management of asthma including inhalers Advised Keith to self assesses asthma  action plan zone and make appointment with provider if in Jodi yellow zone for 48 hours without improvement. Provided  Keith with education about Jodi role of exercise in Jodi management of asthma  Advised Keith to engage in light exercise as tolerated 3-5 days a week Provided education about and advised Keith to utilize infection prevention strategies to reduce risk of respiratory infection  Self-Care Activities:  Keith verbalizes understanding of plan to effective management of asthma  Self administers medications as prescribed Attends all scheduled provider appointments Calls pharmacy for medication refills Attends church or other social activities Performs ADL's independently Performs IADL's independently Calls provider office for new concerns or questions Keith Goals: - do breathing exercises every day - do breathing exercises at least 2 times each day - do exercises in a comfortable position that makes breathing as easy as possible - develop a new routine to improve sleep - don't  eat or exercise right before bedtime - eat healthy - get at least 7 to 8 hours of sleep at night - get outdoors every day (weather permitting) - keep room cool and dark - limit daytime naps - practice relaxation or meditation daily - use a fan or white noise in bedroom - use devices that will help like a cane, sock-puller or reacher - begin a symptom diary - bring symptom diary to all visits - develop a rescue plan - eliminate symptom triggers at home - follow rescue plan if symptoms flare-up - keep follow-up appointments - use an extra pillow to sleep - avoid second hand smoke - eliminate smoking in my home - identify and avoid work-related triggers - identify and remove indoor air pollutants - limit outdoor activity during cold weather - listen for public air quality announcements every day - activity or exercise based on tolerance encouraged - barriers to medication adherence identified - emotional support provided - medication-adherence assessment completed - screen for functional limitations  reviewed - side effects of medication monitored - self-awareness of symptom triggers encouraged - symptom control monitored - symptom log or asthma action plan reviewed - symptom triggers identified Follow Up Plan: Telephone follow up appointment with care management team member scheduled for: 03-11-2021 at 58 am    Problem: RNCM: Smoking cessation   Priority: High     Long-Range Goal: RNCM: Smoking Cessation   Start Date: 08/26/2020  Expected End Date: 08/26/2021  This Visit's Progress: On track  Recent Progress: On track  Priority: High  Note:   Current Barriers:  Unable to independently stop smoking.  Lacks social connections Does not contact provider office for questions/concerns Tobacco abuse of >30 years; currently smoking 1 ppd Previous quit attempts, unsuccessful several successful using 3 times while she was pregnant and quit because she did not want to harm Jodi baby  Reports smoking within 30 minutes of waking up Reports triggers to smoke include: anxiety, not having Jodi answers to her health problems Reports motivation to quit smoking includes: knows it would be better for her health and well being  On a scale of 1-10, reports MOTIVATION to quit is 7 On a scale of 1-10, reports CONFIDENCE in quitting is 7 Clinical Goal(s):  Keith will work with Chief Operating Officer and provider towards tobacco cessation  Interventions: Collaboration with Vigg, Avanti, MD regarding development and update of comprehensive plan of care as evidenced by provider attestation and co-signature Inter-disciplinary care team collaboration (see longitudinal plan of care) Evaluation of current treatment plan reviewed- Jodi Keith wants to quit but states she smokes more when her anxiety level is up. Is interested in medications to help with quitting but states she can not afford. Pharm D referral. 11-04-2020: Jodi Keith is diligently working on cutting back on  her smoking. She is doing better at managing her anxiety and states she is wanting to change her habits so she can feel better and be there for her family. 01-06-2021: Jodi Keith states she is smoking about one cigarette a day. She wants to quit but has not fully quit yet. Education and support.  Provided contact information for [place] [facility] (1-800-QUIT-NOW). Keith will outreach this group for support. Discussed plans with Keith for ongoing care management follow up and provided Keith with direct contact information for care management team Provided Keith with printed smoking cessation educational materials Reviewed scheduled/upcoming provider appointments including: Jodi Keith needs a follow up with Jodi pcp  Referred Keith to pharmacy team Provided contact information for [place] [facility] (1-800-QUIT-NOW). Keith will outreach this group for support. Evaluation of current treatment plan reviewed. 01-06-2021: See provider regularly. Actively working on cutting back with goal of quitting  Keith Goals/Self-Care Activities - diversion activities as a habit during cravings  - verbally commit to reducing tobacco consumption - adherence to treatment plan encouraged - barriers to adherence to treatment plan identified - lifestyle changes encouraged - psychosocial concerns monitored - symptoms of respiratory distress monitored  Follow Up Plan: Telephone follow up appointment with care management team member scheduled for: 03-11-2021 at 81 am    Care Plan : RNCM: Depression (Adult) and Anxiety  Updates made by Vanita Ingles, RN since 01/06/2021 12:00 AM     Problem: RNCM: Symptoms (Depression) and Anxiety   Priority: High  Onset Date: 08/26/2020     Long-Range Goal: RNCM; Symptoms Monitored and Managed: Depression and Anxiety   Start Date: 08/26/2020  Expected End Date: 08/26/2021  This Visit's Progress: Not on track  Recent Progress: On track  Priority: High  Note:   Current  Barriers:  Ineffective Self Health Maintenance in a Keith with Anxiety and Depression Unable to independently depression and anxiety  Has had a lot of losses in her life with death and grief: father, step-mother, sister (suicide), grandfather, good friend, other family members Clinical Goal(s):  Collaboration with Vigg, Avanti, MD regarding development and update of comprehensive plan of care as evidenced by provider attestation and co-signature Inter-disciplinary care team collaboration (see longitudinal plan of care) Keith will work with care management team to address care coordination and chronic disease management needs related to Psychosocial Support Mental Health Counseling Level of Care Concerns   Interventions:  Evaluation of current treatment plan related to Anxiety and Depression, Level of care concerns, Mental Health Concerns , and grief and loss  self-management and Keith's adherence to plan as established by provider. 01-06-2021: Jodi Keith states that she is not at a good place today. She is down and triggers are present. She discussed missing her dad and even though it has been 3 years she was thinking of him. Jodi Keith talked about how she had been on Zoloft before but she had SI. She denies any SI but does not want to take anything that may trigger that as there is a family history of suicide with her own sister committing suicide 3 years ago after her father passed. She states several cousins and other family members have committed suicide. Jodi Keith is receptive to talking to Jodi pcp about something for depression. She says Jodi pcp has offered this before. Ask her to consider this because there are several options other than zoloft. Encouraged Jodi Keith to journal her feelings, work with LCSW, do relaxation, mindfulness, and other things that may work for effective dealing with depression and anxiety.  Collaboration with Charlynne Cousins, MD regarding development and update of  comprehensive plan of care as evidenced by provider attestation       and co-signature Inter-disciplinary care team collaboration (see longitudinal plan of care) Discussed plans with Keith for ongoing care management follow up and provided Keith with direct contact information for care management team Self Care Activities:  Keith verbalizes understanding of plan to effectively manage depression and anxiety due to grief and multiple chronic conditions  Self administers medications as prescribed Attends all scheduled provider appointments Calls pharmacy for medication refills Attends church or other social activities Performs ADL's independently Performs IADL's  independently Calls provider office for new concerns or questions Keith Goals: - activity or exercise based on tolerance encouraged - depression screen reviewed - emotional support provided - healthy lifestyle promoted - medication side effects monitored and managed - pain managed - participation in mental health treatment encouraged - quality of sleep assessed - response to mental health treatment monitored - response to pharmacologic therapy monitored - sleep hygiene techniques encouraged - social activities and relationships encouraged - substance use assessed - work or school reintegration facilitated Follow Up Plan: Telephone follow up appointment with care management team member scheduled for: 03-11-2021 at 66 am     Plan: Telephone follow up appointment with care management team member scheduled for:  03-11-2021 at 41 am  [place], MSN, [facility] Mobile: [phone]

## 2021-01-06 NOTE — Patient Instructions (Signed)
Visit Information   Goals Addressed             This Visit's Progress    RNCM: Manage Chronic Pain       Timeframe:  Long-Range Goal Priority:  High Start Date:              08-26-2020               Expected End Date:   12-16-2021                  Follow Up Date 03-11-2021   - develop a personal pain management plan - plan exercise or activity when pain is best controlled - prioritize tasks for the day - track times pain is worst and when it is best - track what makes the pain worse and what makes it better - use ice or heat for pain relief - work slower and less intense when having pain    Why is this important?   Day-to-day life can be hard when you have chronic pain.  Pain medicine is just one piece of the treatment puzzle.  You can try these action steps to help you manage your pain.    09-09-2020: The patient is frustrated and does not feel her pain is being addressed. Will collaborate with the pcp. 11-04-2020: The patient states today is a good day. She is thankful the pcp is doing what she can to get to the bottom of her pain. The OB/GYN states the pain is not coming from the cyst that she has. She was supposed to have a CT yesterday but because she has had a reaction to the dye in the past they want her to take prednisone before she has the CT.  They will be contacting pcp for orders.  The patient is hopeful they will find out where her source of pain is and help her determine how to effectively manage. 01-06-2021: The patient continues to have pain in her side and the swelling. The pain is worse when she eating, walking, or laying, with eating the worst. She states that she is at a 9 today on a scale of 0-10. She was getting ready to go lay down at the time of the call. Encouraged the patient to take the Vitamin D as directed by the thyroid specialist. The patient wakes up hurting. The patient is going to call the office for an appointment to see Dr. Charlotta Newton and also she is going to  start taking the Vitamin D. Will continue to monitor for changes in condition.      RNCM; Track and Manage My Symptoms-Depression       Timeframe:  Long-Range Goal Priority:  High Start Date:        11-04-2020                     Expected End Date:     11-04-2021                  Follow Up Date 03-11-2021    - avoid negative self-talk - develop a personal safety plan - develop a plan to deal with triggers like holidays, anniversaries - exercise at least 2 to 3 times per week - have a plan for how to handle bad days - journal feelings and what helps to feel better or worse - spend time or talk with others at least 2 to 3 times per week - spend time or talk with  others every day - watch for early signs of feeling worse - write in journal every day    Why is this important?   Keeping track of your progress will help your treatment team find the right mix of medicine and therapy for you.  Write in your journal every day.  Day-to-day changes in depression symptoms are normal. It may be more helpful to check your progress at the end of each week instead of every day.     Notes: 11-04-2020: The patient is having a good day today. The patient has made positive progress in managing her anxiety and depression. She is cutting back on smoking, she is eating healthier, she wants to lose weight, she is walking BID for at least 30 minutes and she is active with her 9 grandchildren.  She has experienced a lot of loss in her life over the last 3 years. She has lost her father, her step-mom, her sister (suicide), grandfather, and close friend. Recently when returning from vacation they got a call on the way home that her husbands cousin had died and a good friend was killed in a car accident. She states she is handling death and grief better than she has in the past. Praised the patient for positive changes and working with the CCM team and providers to optimize her health and well being. 01-06-2021: The patient  does not feel she is in the best place with her emotions and depression. She has been in the bed for a month now. The patient states that she does not feel "motivated" to do anything. She gets up to use the bathroom and then she may eat a little and she goes back to bed. Discussed journal writing, mindfulness, relaxation, and talking to the provider about her depression and other concerns. Did discuss something to help with her depression. Ask the patient to talk to pcp about her concerns. Encouraged the patient that the CCM team is here for her to help her through the hard times and effectively manage health and well being. Her husband is one of her biggest cheerleaders and does not want her to give up. Reflective listening and support. Will continue to monitor.         Patient verbalizes understanding of instructions provided today and agrees to view in MyChart.   Telephone follow up appointment with care management team member scheduled for: 03-11-2021 at 1030 am   Alto [place] RN, MSN, CCM Community Care Coordinator [place]  [facility] Mobile: [phone]

## 2021-01-09 ENCOUNTER — Ambulatory Visit: Payer: 59 | Admitting: Nurse Practitioner

## 2021-01-12 ENCOUNTER — Ambulatory Visit: Payer: 59 | Admitting: Internal Medicine

## 2021-01-12 ENCOUNTER — Ambulatory Visit: Payer: Self-pay | Admitting: *Deleted

## 2021-01-12 NOTE — Telephone Encounter (Signed)
Routing to provider as an FYI

## 2021-01-12 NOTE — Telephone Encounter (Signed)
Reason for Disposition . [1] MODERATE dizziness (e.g., interferes with normal activities) AND [2] has NOT been evaluated by physician for this  (Exception: dizziness caused by heat exposure, sudden standing, or poor fluid intake)  Protocols used: Dizziness - Lightheadedness-A-AH

## 2021-01-12 NOTE — Telephone Encounter (Signed)
Pt reports dizziness, "Spinning" onset this AM. Worse sitting to standing. BP 123/83  HR90. Reports achy, headache 6/10, "Feel warm and have chills." HAs taken "Six IBU and tylenol this AM, no help." States is staying hydrated.  Pt tested negative covid yesterday, home test. Pt also reports had tick on right side abdomen, unsure how long imbedded, noted last week. Denies rash. Does have subjective fever. Pt states had appt with Dr. Charlotta Newton at 230 (F/U) but cancelled due to dizziness. Pt states  I think I will go to an UC today , I'll find someone to take me." Assured pt NT would route to practice for PCPs review. Care advise given, pt verbalizes understanding.

## 2021-01-15 ENCOUNTER — Ambulatory Visit: Payer: 59 | Admitting: Licensed Clinical Social Worker

## 2021-01-19 NOTE — Chronic Care Management (AMB) (Signed)
Care Management Clinical Social Work Note  01/19/2021 Name: Jodi Keith MRN: [id number] DOB: Jul 20, 1965  Amzie Sillas is a 55 y.o. year old female who is a primary care patient of Vigg, Avanti, MD.  The Care Management team was consulted for assistance with chronic disease management and coordination needs.  Engaged with patient by telephone for follow up visit in response to provider referral for social work chronic care management and care coordination services  Consent to Services:  Ms. Cinco was given information about Care Management services today including:  Care Management services includes personalized support from designated clinical staff supervised by her physician, including individualized plan of care and coordination with other care providers 24/7 contact phone numbers for assistance for urgent and routine care needs. The patient may stop case management services at any time by phone call to the office staff.  Patient agreed to services and consent obtained.   Consent to Services:  The patient was given information about Care Management services, agreed to services, and gave verbal consent prior to initiation of services.  Please see initial visit note for detailed documentation.   Patient agreed to services today and consent obtained.   Assessment: Engaged with patient by phone in response to provider referral for social work care coordination services: Mental Health Counseling and Resources.    Patient continues to experience difficulty with management of depression and anxiety symptoms triggered by chronic pain. Patient is open to medication management to assist with decreasing symptoms. She has agreed to re-schedule with PCP and CCM LCSW sent message to [facility] admin to further assist. Strategies to improve motivation and cope with pain discussed. See Care Plan below for interventions and patient self-care activities.  Recent life changes or stressors:  Pain  Recommendation: Patient may benefit from, and is in agreement work with LCSW to address care coordination needs and will continue to work with the clinical team to address health care and disease management related needs.   Follow up Plan: Patient would like continued follow-up from CCM LCSW .  per patient's request will follow up in 03/19/21.  Will call office if needed prior to next encounter.  SDOH (Social Determinants of Health) assessments and interventions performed:  NA  Advanced Directives Status: Not addressed in this encounter.  Care Plan  Allergies  Allergen Reactions   Prochlorperazine Edisylate Anaphylaxis    coma   Contrast Media [Iodinated Diagnostic Agents] Palpitations and Other (See Comments)    Patient had severe hypotension, rapid heart beat after initial contrast injection "years ago." For recent procedure using IV contrast patient had appropriate 13 hour premedication.     Outpatient Encounter Medications as of 01/15/2021  Medication Sig   albuterol (VENTOLIN HFA) 108 (90 Base) MCG/ACT inhaler INHALE 2 PUFFS BY MOUTH EVERY 6 HOURS AS NEEDED FOR WHEEZING FOR SHORTNESS OF BREATH   amLODipine (NORVASC) 10 MG tablet Take 1 tablet by mouth daily.   atorvastatin (LIPITOR) 20 MG tablet Take 1 tablet by mouth once daily   famotidine (PEPCID) 20 MG tablet Take 1 tablet (20 mg total) by mouth at bedtime.   Homeopathic Products (RESTFUL LEGS SL) Place 1 tablet under the tongue at bedtime.   hydrochlorothiazide (MICROZIDE) 12.5 MG capsule Take 1 capsule (12.5 mg total) by mouth daily.   ibuprofen (ADVIL) 200 MG tablet Take 200 mg by mouth every 4 (four) hours as needed (patient states that she takes 6 at a time 5 or 6 times a day). Education- will do pharmacy  referral   levothyroxine (SYNTHROID) 50 MCG tablet Take 1 tablet (50 mcg total) by mouth daily before breakfast.   losartan (COZAAR) 50 MG tablet Take 1 tablet (50 mg total) by mouth daily.   nystatin cream  (MYCOSTATIN) APPLY CREAM TOPICALLY TWICE DAILY   predniSONE (DELTASONE) 50 MG tablet Use Oral prednisone 50 mg at  13 hours  7 hours  and  1 hour prior to contrast administration.   spironolactone (ALDACTONE) 25 MG tablet Take 0.5 tablets (12.5 mg total) by mouth daily.   Tiotropium Bromide Monohydrate (SPIRIVA RESPIMAT) 2.5 MCG/ACT AERS Inhale 2 puffs into the lungs daily.   No facility-administered encounter medications on file as of 01/15/2021.    Patient Active Problem List   Diagnosis Date Noted   HTN (hypertension) 01/02/2021   Cramp in limb 01/02/2021   Asthma case management patient 09/30/2020   SOB (shortness of breath) 09/30/2020   LUQ pain 09/30/2020   Hypothyroidism 09/30/2020   Polycythemia 09/30/2020   Moderate asthma with exacerbation 09/17/2020   Hypertensive emergency 05/02/2018    Conditions to be addressed/monitored: Anxiety and Depression; Limited social support, Mental Health Concerns , and Chronic Pain  Care Plan : Clinical Social Work (Adult)  Updates made by Bridgett Larsson, LCSW since 01/19/2021 12:00 AM     Problem: Coping Skills (General Plan of Care)      Long-Range Goal: Coping Skills Enhanced   Start Date: 10/09/2020  This Visit's Progress: On track  Recent Progress: On track  Priority: Medium  Note:   Current barriers:   Acute Mental Health needs related to Anxiety Mental Health Concerns  Needs Support, Education, and Care Coordination in order to meet unmet mental health needs. Clinical Goal(s): Over the next 120 days, patient will work with SW, counselor and therapist to reduce or manage symptoms of agitation, mood instability, stress, and bipolar until connected for ongoing counseling. Clinical Interventions:  Assessed patient's previous and current treatment, coping skills, support system and barriers to care  Patient interviewed and appropriate assessments performed Patient reports nothing has changed. Patient was informed yesterday  that PCP is not going not prescribe any additional weight management medication. Patient states I'm going to do me" Frustrated that medical team is unable to find root of pain despite her being compliant with medical appointments that she travels to. Patient had CT completed this week. She was informed that there was nothing found in the CT scan to justify ongoing pain 09/29: Patient that she has felt "lightheaded and my bones ache". "I just lay there and cry most of the time" due to feeling limited as to what she can do from ongoing leg pain (from the knees down and back cramps) She missed recent follow up appt with PCP. CCM LCSW discussed importance of maintaining appointments and discussed strategies to improve motivation. Patient agreed to reschedule appt and CCM LCSW sent message to [facility] admin to assist  Patient reports adverse side effects from medication PCP prescribed for pain management (Tramadol made her tongue swell and prevented her from eating for two days) Patient reports she obtains hydrocodone from a friend and takes half a pill daily to assist her with walking twice a day and taking care her household Patient verbalized understanding that her weight contributes to her pain, which is why she is motivated to try medications to assist with weight loss. Patient feels like her blood pressure is managed, stating "I check my blood pressure twice a day faithfully" Patient reports that she has  smoked 15 cigarettes in two weeks. "I'm doing what I need to do" Patient is requesting a referral to a Nutritionist.  CCM LCSW strongly encouraged patient to continue utilizing pain management strategies recommended by PCP and Gastrologist. Patient reports she takes Ibuprofen daily Patient denied any new falls, stating "When going to the bathroom I have something to hold on to at all times" She continues to receive support from spouse CCM LCSW implemented Motivational Interviewing strategies to promote mood.  Patient identified ways she will reward self and implement self-care Patient reports difficulty managing symptoms of anxiety triggered by chronic pain. States that she "struggles daily" Patient is also managing extensive grief regarding the passing of her father, step-mom, sister (suicide), grandfather, and close friend passing all within 3 years. Patient denies SI/HI. Anxiety symptoms include, feelings of sadness, irritability, and chronic pain 09/29: Patient endorses depression and anxiety symptoms throughout the day "I feel useless". She denies SI/HI. Strategies to assist with depression and anxiety management identified and discussed. Patient has been compliant with taking vitamin D been 2 weeks. CCM LCSW informed patient of correlation between low vitamin d and depression symptoms. Patient is open to medication management to assist with management of symptoms Patient was successful at identifying protective factors that assist with motivation (9 grandchildren "they are my world") She receives strong support from husband and daughter, who both walk with patient regularly to assist in management of health conditions CCM LCSW provided validation and encouragement. Patient was informed of correlation between one's physical and mental health and was commended for establishing an exercise routine, that can assist with both. Patient shared that her goal is to lose weight to assist in the management of diabetes Patient reports frustration that she has modified diet and implemented exercise in her routine with very minimal results. Patient reports that she was eating salad without dressing, walking "as much as my legs would allow" morning and evening, and decreased soda intake (went from drinking 8-10 pepsi's a day to zero for a month) Patient only drinks water and unsweetened tea Patient is concerned about insurance provider not fully covering medications for weight loss. States that PCP has attempted to prescribe  two medications that weren't covered and was outside of patient's budget Patient takes medications and get rest when feeling overwhelmed Interventions provided: Motivational Interviewing Strategies, Mindfulness or Management consultant, Active listening / Reflection utilized , Emotional Supportive Provided, and Verbalization of feelings encouraged  ; Discussed plans with patient for ongoing care management follow up and provided patient with direct contact information for care management team Collaboration with PCP regarding development and update of comprehensive plan of care as evidenced by provider attestation and co-signature Inter-disciplinary care team collaboration (see longitudinal plan of care) Patient Goals/Self-Care Activities: Over the next 120 days Attend all scheduled appointments with providers Contact office with any questions or concerns Continue with compliance of taking medication  Avoid negative self-talk Spend time or talk with others at least 2 to 3 times per week Practice relaxation or meditation daily Practice positive thinking and self-talk     Jenel Lucks, MSW, LCSW [facility] Care Management [place]  [facility] [email or web address] Phone [phone] 7:09 AM

## 2021-01-19 NOTE — Patient Instructions (Signed)
Visit Information   Goals Addressed             This Visit's Progress    Anxiety Symptoms Identified   On track    Patient Goals/Self-Care Activities: Over the next 120 days Attend all scheduled appointments with providers Contact office with any questions or concerns Continue with compliance of taking medication  Avoid negative self-talk Spend time or talk with others at least 2 to 3 times per week Practice relaxation or meditation daily Practice positive thinking and self-talk        Patient verbalizes understanding of instructions provided today and agrees to view in MyChart.   Telephone follow up appointment with care management team member scheduled for:03/19/21  Jenel Lucks, MSW, LCSW [facility] Care Management [facility]  [facility] [email or web address] Phone [phone] 7:10 AM

## 2021-01-23 ENCOUNTER — Encounter: Payer: Self-pay | Admitting: Internal Medicine

## 2021-01-23 ENCOUNTER — Ambulatory Visit (INDEPENDENT_AMBULATORY_CARE_PROVIDER_SITE_OTHER): Payer: 59 | Admitting: Internal Medicine

## 2021-01-23 ENCOUNTER — Other Ambulatory Visit: Payer: Self-pay

## 2021-01-23 VITALS — BP 127/63 | HR 80 | Temp 97.7°F | Ht 62.99 in | Wt 237.0 lb

## 2021-01-23 DIAGNOSIS — F17200 Nicotine dependence, unspecified, uncomplicated: Secondary | ICD-10-CM

## 2021-01-23 DIAGNOSIS — M5441 Lumbago with sciatica, right side: Secondary | ICD-10-CM | POA: Diagnosis not present

## 2021-01-23 DIAGNOSIS — G8929 Other chronic pain: Secondary | ICD-10-CM | POA: Insufficient documentation

## 2021-01-23 DIAGNOSIS — I10 Essential (primary) hypertension: Secondary | ICD-10-CM | POA: Insufficient documentation

## 2021-01-23 DIAGNOSIS — D751 Secondary polycythemia: Secondary | ICD-10-CM | POA: Diagnosis not present

## 2021-01-23 DIAGNOSIS — F339 Major depressive disorder, recurrent, unspecified: Secondary | ICD-10-CM

## 2021-01-23 DIAGNOSIS — Z23 Encounter for immunization: Secondary | ICD-10-CM | POA: Diagnosis not present

## 2021-01-23 DIAGNOSIS — G2581 Restless legs syndrome: Secondary | ICD-10-CM

## 2021-01-23 MED ORDER — BUPROPION HCL ER (SR) 100 MG PO TB12: 100.0000 mg | ORAL_TABLET | Freq: Every day | ORAL | 1 refills | Status: DC

## 2021-01-23 MED ORDER — PRAMIPEXOLE DIHYDROCHLORIDE 0.125 MG PO TABS: 0.1250 mg | ORAL_TABLET | Freq: Every evening | ORAL | 1 refills | Status: DC

## 2021-01-23 MED ORDER — BUPROPION HCL ER (SR) 100 MG PO TB12
100.0000 mg | ORAL_TABLET | Freq: Every day | ORAL | 1 refills | Status: DC
Start: 2021-01-23 — End: 2021-01-23

## 2021-01-23 MED ORDER — BUPROPION HCL ER (XL) 150 MG PO TB24: 150.0000 mg | ORAL_TABLET | Freq: Every day | ORAL | 5 refills | Status: DC

## 2021-01-23 NOTE — Progress Notes (Signed)
BP 127/63   Pulse 80   Temp 97.7 F (36.5 C) (Oral)   Ht 5' 2.99" (1.6 m)   Wt 237 lb (107.5 kg)   LMP  (LMP Unknown)   SpO2 98%   BMI 41.99 kg/m    Subjective:    Patient ID: Jodi Keith, female    DOB: 06/28/1965, 55 y.o.   MRN: [id number]  Chief Complaint  Patient presents with   Anxiety   Depression   Congestive Heart Failure   Joint Pain    Elbows, knees, and ankles     HPI: Jodi Keith is a 55 y.o. female  Seen by [facility] endocrinology for MNG - to have a repeat US for such in 6 motnhs.   Anxiety Presents for follow-up (feels anxious about her health and feels like she is having nightmares about snakes.) visit. Symptoms include restlessness. Patient reports no chest pain, compulsions, feeling of choking, hyperventilation, impotence, insomnia, irritability, malaise, palpitations, panic, shortness of breath or suicidal ideas.    Depression        This is a chronic problem.  Associated symptoms include restlessness.  Associated symptoms include does not have insomnia and no suicidal ideas.  Past medical history includes anxiety.   Congestive Heart Failure Presents for follow-up visit. Pertinent negatives include no abdominal pain, chest pain, palpitations or shortness of breath.  Back Pain This is a recurrent (co lower back pain worseinng) problem. The current episode started 1 to 4 weeks ago. The problem occurs constantly. The problem has been waxing and waning since onset. The pain is present in the lumbar spine and sacro-iliac. The quality of the pain is described as aching. The pain radiates to the right foot and right thigh. The pain is at a severity of 7/10. The pain is mild. Associated symptoms include paresthesias and tingling. Pertinent negatives include no abdominal pain, bladder incontinence, bowel incontinence, chest pain, dysuria, fever, numbness, paresis, pelvic pain or perianal numbness. (Tingling in bottom of feet)   Chief Complaint  Patient  presents with   Anxiety   Depression   Congestive Heart Failure   Joint Pain    Elbows, knees, and ankles     Relevant past medical, surgical, family and social history reviewed and updated as indicated. Interim medical history since our last visit reviewed. Allergies and medications reviewed and updated.  Review of Systems  Constitutional:  Negative for fever and irritability.  Respiratory:  Negative for shortness of breath.   Cardiovascular:  Negative for chest pain and palpitations.  Gastrointestinal:  Negative for abdominal pain and bowel incontinence.  Genitourinary:  Negative for bladder incontinence, dysuria, impotence and pelvic pain.  Musculoskeletal:  Positive for back pain.  Neurological:  Positive for tingling and paresthesias. Negative for numbness.  Psychiatric/Behavioral:  Positive for depression. Negative for suicidal ideas. The patient does not have insomnia.    Per HPI unless specifically indicated above     Objective:    BP 127/63   Pulse 80   Temp 97.7 F (36.5 C) (Oral)   Ht 5' 2.99" (1.6 m)   Wt 237 lb (107.5 kg)   LMP  (LMP Unknown)   SpO2 98%   BMI 41.99 kg/m   Wt Readings from Last 3 Encounters:  01/23/21 237 lb (107.5 kg)  01/02/21 234 lb 9.6 oz (106.4 kg)  11/10/20 233 lb 9.6 oz (106 kg)    Physical Exam Vitals and nursing note reviewed.  Constitutional:      General: She  is not in acute distress.    Appearance: Normal appearance. She is not ill-appearing or diaphoretic.  HENT:     Head: Normocephalic and atraumatic.     Right Ear: Tympanic membrane and external ear normal. There is no impacted cerumen.     Left Ear: External ear normal.     Nose: No congestion or rhinorrhea.     Mouth/Throat:     Pharynx: No oropharyngeal exudate or posterior oropharyngeal erythema.  Eyes:     Conjunctiva/sclera: Conjunctivae normal.     Pupils: Pupils are equal, round, and reactive to light.  Cardiovascular:     Rate and Rhythm: Normal rate and  regular rhythm.     Heart sounds: No murmur heard.   No friction rub. No gallop.  Pulmonary:     Effort: No respiratory distress.     Breath sounds: No stridor. No wheezing or rhonchi.  Chest:     Chest wall: No tenderness.  Abdominal:     General: Abdomen is flat. Bowel sounds are normal. There is no distension.     Palpations: Abdomen is soft. There is no mass.     Tenderness: There is no abdominal tenderness. There is no guarding.  Musculoskeletal:        General: No swelling or deformity.     Cervical back: Normal range of motion and neck supple. No rigidity or tenderness.     Right lower leg: No edema.     Left lower leg: No edema.  Skin:    General: Skin is warm and dry.     Coloration: Skin is not jaundiced.     Findings: No erythema.  Neurological:     Mental Status: She is alert and oriented to person, place, and time. Mental status is at baseline.  Psychiatric:        Mood and Affect: Mood normal.        Behavior: Behavior normal.        Thought Content: Thought content normal.        Judgment: Judgment normal.    Results for orders placed or performed in visit on 01/02/21  VITAMIN D 25 Hydroxy (Vit-D Deficiency, Fractures)  Result Value Ref Range   VITD 16.38 (L) 30.00 - 100.00 ng/mL  PTH, intact and calcium  Result Value Ref Range   PTH 57 16 - 77 pg/mL   Calcium 8.9 8.6 - 10.4 mg/dL  TSH  Result Value Ref Range   TSH 4.28 0.35 - 5.50 uIU/mL  T4, free  Result Value Ref Range   Free T4 1.08 0.60 - 1.60 ng/dL  Basic metabolic panel  Result Value Ref Range   Sodium 137 135 - 145 mEq/L   Potassium 3.9 3.5 - 5.1 mEq/L   Chloride 102 96 - 112 mEq/L   CO2 26 19 - 32 mEq/L   Glucose, Bld 111 (H) 70 - 99 mg/dL   BUN 12 6 - 23 mg/dL   Creatinine, Ser 8.29 0.40 - 1.20 mg/dL   GFR 93.71 (L) >69.67 mL/min   Calcium 8.6 8.4 - 10.5 mg/dL        Current Outpatient Medications:    albuterol (VENTOLIN HFA) 108 (90 Base) MCG/ACT inhaler, INHALE 2 PUFFS BY MOUTH  EVERY 6 HOURS AS NEEDED FOR WHEEZING FOR SHORTNESS OF BREATH, Disp: 16 g, Rfl: 0   amLODipine (NORVASC) 10 MG tablet, Take 1 tablet by mouth daily., Disp: , Rfl:    atorvastatin (LIPITOR) 20 MG tablet, Take 1 tablet by  mouth once daily, Disp: 30 tablet, Rfl: 0   famotidine (PEPCID) 20 MG tablet, Take 1 tablet (20 mg total) by mouth at bedtime., Disp: 30 tablet, Rfl: 3   Homeopathic Products (RESTFUL LEGS SL), Place 1 tablet under the tongue at bedtime., Disp: , Rfl:    ibuprofen (ADVIL) 200 MG tablet, Take 200 mg by mouth every 4 (four) hours as needed (patient states that she takes 6 at a time 5 or 6 times a day). Education- will do pharmacy referral, Disp: , Rfl:    levothyroxine (SYNTHROID) 50 MCG tablet, Take 1 tablet (50 mcg total) by mouth daily before breakfast., Disp: 30 tablet, Rfl: 4   nystatin cream (MYCOSTATIN), APPLY CREAM TOPICALLY TWICE DAILY, Disp: 30 g, Rfl: 0   spironolactone (ALDACTONE) 25 MG tablet, Take 0.5 tablets (12.5 mg total) by mouth daily., Disp: 15 tablet, Rfl: 0   Tiotropium Bromide Monohydrate (SPIRIVA RESPIMAT) 2.5 MCG/ACT AERS, Inhale 2 puffs into the lungs daily., Disp: 1 each, Rfl: 5   VITAMIN D, ERGOCALCIFEROL, PO, Take 5,000 Int'l Units/day by mouth daily., Disp: , Rfl:    hydrochlorothiazide (MICROZIDE) 12.5 MG capsule, Take 1 capsule (12.5 mg total) by mouth daily. (Patient not taking: Reported on 01/23/2021), Disp: 30 capsule, Rfl: 3   losartan (COZAAR) 50 MG tablet, Take 1 tablet (50 mg total) by mouth daily., Disp: 30 tablet, Rfl: 3   predniSONE (DELTASONE) 50 MG tablet, Use Oral prednisone 50 mg at  13 hours  7 hours  and  1 hour prior to contrast administration. (Patient not taking: Reported on 01/23/2021), Disp: 3 tablet, Rfl: 0    Assessment & Plan:  Results for CARMA, DWIGGINS (MRN [id number]) as of 01/23/2021 10:58  Ref. Range 07/30/2020 10:34 09/30/2020 10:21  Hemoglobin Latest Ref Range: 11.1 - 15.9 g/dL 78.2 (H) 95.6 (H)  HCT Latest Ref Range: 34.0  - 46.6 % 48.7 (H) 47.1 (H)  Polycythemia  1/2 ppd  Smoked for about 25 days.  Pt feels anxious failed patches sec to headaches.    2. Depression / smoking cessation  Will start pt on welbutrin xl  Fu with me in 3 weeks.  TSH better now.    3. MNG sees endocrinology for such @ [facility] appreciate input.    4. Vit D Def takes vit d replacmeents.   5. Back pain  Check xrays of L spine  Consider Mri  Consider Pt  6. RLS start pt on mirapex.  Fu with me 3- 4 weke.s   Problem List Items Addressed This Visit   None Visit Diagnoses     Need for influenza vaccination    -  Primary   Relevant Orders   Flu Vaccine QUAD 37mo+IM (Fluarix, Fluzone & Alfiuria Quad PF) (Completed)        Orders Placed This Encounter  Procedures   Flu Vaccine QUAD 83mo+IM (Fluarix, Fluzone & Alfiuria Quad PF)     No orders of the defined types were placed in this encounter.    Follow up plan: No follow-ups on file.  Labs 1 week prior to next visit.

## 2021-01-24 LAB — COMPREHENSIVE METABOLIC PANEL
ALT: 25 IU/L (ref 0–32)
AST: 17 IU/L (ref 0–40)
Albumin/Globulin Ratio: 1.4 (ref 1.2–2.2)
Albumin: 4.1 g/dL (ref 3.8–4.9)
Alkaline Phosphatase: 117 IU/L (ref 44–121)
BUN/Creatinine Ratio: 10 (ref 9–23)
BUN: 11 mg/dL (ref 6–24)
Bilirubin Total: 0.6 mg/dL (ref 0.0–1.2)
CO2: 20 mmol/L (ref 20–29)
Calcium: 9.2 mg/dL (ref 8.7–10.2)
Chloride: 101 mmol/L (ref 96–106)
Creatinine, Ser: 1.15 mg/dL — ABNORMAL HIGH (ref 0.57–1.00)
Globulin, Total: 2.9 g/dL (ref 1.5–4.5)
Glucose: 84 mg/dL (ref 70–99)
Potassium: 4.3 mmol/L (ref 3.5–5.2)
Sodium: 138 mmol/L (ref 134–144)
Total Protein: 7 g/dL (ref 6.0–8.5)
eGFR: 57 mL/min/{1.73_m2} — ABNORMAL LOW (ref >59)

## 2021-01-24 LAB — CBC WITH DIFFERENTIAL/PLATELET
Basophils Absolute: 0.1 10*3/uL (ref 0.0–0.2)
Basos: 1 %
EOS (ABSOLUTE): 0.2 10*3/uL (ref 0.0–0.4)
Eos: 2 %
Hematocrit: 42 % (ref 34.0–46.6)
Hemoglobin: 14.4 g/dL (ref 11.1–15.9)
Immature Grans (Abs): 0 10*3/uL (ref 0.0–0.1)
Immature Granulocytes: 1 %
Lymphocytes Absolute: 3.1 10*3/uL (ref 0.7–3.1)
Lymphs: 37 %
MCH: 31.2 pg (ref 26.6–33.0)
MCHC: 34.3 g/dL (ref 31.5–35.7)
MCV: 91 fL (ref 79–97)
Monocytes Absolute: 0.5 10*3/uL (ref 0.1–0.9)
Monocytes: 6 %
Neutrophils Absolute: 4.4 10*3/uL (ref 1.4–7.0)
Neutrophils: 53 %
Platelets: 324 10*3/uL (ref 150–450)
RBC: 4.62 x10E6/uL (ref 3.77–5.28)
RDW: 13.2 % (ref 11.7–15.4)
WBC: 8.3 10*3/uL (ref 3.4–10.8)

## 2021-01-24 LAB — TSH: TSH: 6.44 u[IU]/mL — ABNORMAL HIGH (ref 0.450–4.500)

## 2021-01-26 ENCOUNTER — Other Ambulatory Visit: Payer: Self-pay

## 2021-01-26 ENCOUNTER — Ambulatory Visit
Admission: RE | Admit: 2021-01-26 | Discharge: 2021-01-26 | Disposition: A | Discharge status: Home / Self Care | Payer: 59 | Source: Ambulatory Visit | Attending: Internal Medicine | Admitting: Internal Medicine

## 2021-01-26 ENCOUNTER — Ambulatory Visit
Admission: RE | Admit: 2021-01-26 | Discharge: 2021-01-26 | Disposition: A | Discharge status: Home / Self Care | Payer: 59 | Attending: Internal Medicine | Admitting: Internal Medicine

## 2021-01-26 DIAGNOSIS — M5441 Lumbago with sciatica, right side: Secondary | ICD-10-CM | POA: Insufficient documentation

## 2021-01-26 DIAGNOSIS — G8929 Other chronic pain: Secondary | ICD-10-CM | POA: Insufficient documentation

## 2021-01-27 ENCOUNTER — Encounter: Payer: Self-pay | Admitting: Internal Medicine

## 2021-02-03 ENCOUNTER — Telehealth: Payer: Self-pay | Admitting: Internal Medicine

## 2021-02-03 NOTE — Telephone Encounter (Signed)
Referral Request - Has patient seen PCP for this complaint? Yes.    Referral for which specialty: Orthopedic (In pt insurance network)  Preferred provider/office: Any  Reason for referral: Pain in Joints

## 2021-02-05 NOTE — Telephone Encounter (Signed)
Patient would like to know if she can have a referral to Ortho for her joint pain. Referral placed

## 2021-02-05 NOTE — Telephone Encounter (Signed)
Please see below and advise.

## 2021-02-06 ENCOUNTER — Other Ambulatory Visit: Payer: Self-pay

## 2021-02-06 ENCOUNTER — Other Ambulatory Visit: Payer: Self-pay | Admitting: Internal Medicine

## 2021-02-06 ENCOUNTER — Other Ambulatory Visit: Payer: Self-pay | Admitting: Nurse Practitioner

## 2021-02-06 ENCOUNTER — Other Ambulatory Visit: Payer: Self-pay | Admitting: Family Medicine

## 2021-02-06 DIAGNOSIS — I509 Heart failure, unspecified: Secondary | ICD-10-CM

## 2021-02-06 DIAGNOSIS — L0291 Cutaneous abscess, unspecified: Secondary | ICD-10-CM

## 2021-02-07 NOTE — Telephone Encounter (Signed)
Requested Prescriptions  Pending Prescriptions Disp Refills  . albuterol (VENTOLIN HFA) 108 (90 Base) MCG/ACT inhaler [Pharmacy Med Name: Albuterol Sulfate HFA 108 (90 Base) MCG/ACT Inhalation Aerosol Solution] 7 each 0    Sig: INHALE 2 PUFFS BY MOUTH EVERY 6 HOURS AS NEEDED FOR WHEEZING FOR SHORTNESS OF BREATH     Pulmonology:  Beta Agonists Failed - 02/06/2021 12:11 PM      Failed - One inhaler should last at least one month. If the patient is requesting refills earlier, contact the patient to check for uncontrolled symptoms.      Passed - Valid encounter within last 12 months    Recent Outpatient Visits          2 weeks ago Need for influenza vaccination   [facility] Vigg, Avanti, MD   2 months ago LUQ pain   [facility] Vigg, Avanti, MD   4 months ago Morbid obesity (HCC)   [facility] Vigg, Avanti, MD   4 months ago Asthma case management patient   [facility] Vigg, Avanti, MD   4 months ago Moderate asthma with exacerbation, unspecified whether persistent   [facility] Larae Grooms, NP      Future Appointments            In 6 days Vigg, Avanti, MD [facility]

## 2021-02-09 MED ORDER — ALBUTEROL SULFATE HFA 108 (90 BASE) MCG/ACT IN AERS: 1.0000 | INHALATION_SPRAY | Freq: Four times a day (QID) | RESPIRATORY_TRACT | 6 refills | Status: DC | PRN

## 2021-02-09 MED ORDER — AMLODIPINE BESYLATE 10 MG PO TABS: 10.0000 mg | ORAL_TABLET | Freq: Every day | ORAL | 11 refills | Status: DC

## 2021-02-09 MED ORDER — NYSTATIN 100000 UNIT/GM EX CREA: TOPICAL_CREAM | Freq: Two times a day (BID) | CUTANEOUS | 0 refills | Status: DC

## 2021-02-09 MED ORDER — SPIRONOLACTONE 25 MG PO TABS: 12.5000 mg | ORAL_TABLET | Freq: Every day | ORAL | 0 refills | Status: DC

## 2021-02-13 ENCOUNTER — Ambulatory Visit: Payer: 59 | Admitting: Internal Medicine

## 2021-02-20 ENCOUNTER — Encounter: Payer: Self-pay | Admitting: Internal Medicine

## 2021-02-20 ENCOUNTER — Other Ambulatory Visit: Payer: Self-pay

## 2021-02-20 ENCOUNTER — Ambulatory Visit (INDEPENDENT_AMBULATORY_CARE_PROVIDER_SITE_OTHER): Payer: 59 | Admitting: Internal Medicine

## 2021-02-20 VITALS — BP 98/62 | HR 92 | Temp 98.3°F | Ht 63.11 in | Wt 231.6 lb

## 2021-02-20 DIAGNOSIS — G8929 Other chronic pain: Secondary | ICD-10-CM

## 2021-02-20 DIAGNOSIS — Z23 Encounter for immunization: Secondary | ICD-10-CM | POA: Insufficient documentation

## 2021-02-20 DIAGNOSIS — E039 Hypothyroidism, unspecified: Secondary | ICD-10-CM | POA: Diagnosis not present

## 2021-02-20 DIAGNOSIS — R7303 Prediabetes: Secondary | ICD-10-CM | POA: Insufficient documentation

## 2021-02-20 DIAGNOSIS — M549 Dorsalgia, unspecified: Secondary | ICD-10-CM | POA: Diagnosis not present

## 2021-02-20 MED ORDER — LEVOTHYROXINE SODIUM 75 MCG PO TABS: 75.0000 ug | ORAL_TABLET | Freq: Every day | ORAL | 3 refills | Status: DC

## 2021-02-20 MED ORDER — PRAMIPEXOLE DIHYDROCHLORIDE 0.25 MG PO TABS: 0.2500 mg | ORAL_TABLET | Freq: Every evening | ORAL | 4 refills | Status: DC

## 2021-02-20 NOTE — Progress Notes (Signed)
 BP 98/62   Pulse 92   Temp 98.3 F (36.8 C) (Oral)   Ht 5' 3.11" (1.603 m)   Wt 231 lb 9.6 oz (105.1 kg)   LMP  (LMP Unknown)   SpO2 98%   BMI 40.88 kg/m    Subjective:    Patient ID: Jodi Keith, female    DOB: 05/09/1965, 55 y.o.   MRN: [id number]  Chief Complaint  Patient presents with   restless leg    States this is better   muscle/bone pain    Are getting worse all over    HPI: Jodi Keith is a 55 y.o. female  Pt is here for a fu, her Mirapex has helped with RLS , pt is dehydrated some BP is low today and Creat - high , has had ms cramping probably sec to this. Lower Back pain  xrays of L spine wnl,      Hypertension This is a chronic (low today isnt been drinking fluids stopped her hctz sec to cramping was placed on this per her Cardiologist) problem. The problem has been waxing and waning since onset. The problem is controlled.  Back Pain This is a chronic (has hip pain radiating to the lower back) problem. Episode onset: better than last couple visits. The problem occurs 2 to 4 times per day.   Chief Complaint  Patient presents with   restless leg    States this is better   muscle/bone pain    Are getting worse all over    Relevant past medical, surgical, family and social history reviewed and updated as indicated. Interim medical history since our last visit reviewed. Allergies and medications reviewed and updated.  Review of Systems  Musculoskeletal:  Positive for back pain.   Per HPI unless specifically indicated above     Objective:    BP 98/62   Pulse 92   Temp 98.3 F (36.8 C) (Oral)   Ht 5' 3.11" (1.603 m)   Wt 231 lb 9.6 oz (105.1 kg)   LMP  (LMP Unknown)   SpO2 98%   BMI 40.88 kg/m   Wt Readings from Last 3 Encounters:  02/20/21 231 lb 9.6 oz (105.1 kg)  01/23/21 237 lb (107.5 kg)  01/02/21 234 lb 9.6 oz (106.4 kg)    Physical Exam Vitals and nursing note reviewed.  Constitutional:      General: She is not in  acute distress.    Appearance: Normal appearance. She is not ill-appearing or diaphoretic.  HENT:     Head: Normocephalic and atraumatic.     Right Ear: Tympanic membrane and external ear normal. There is no impacted cerumen.     Left Ear: External ear normal.     Nose: No congestion or rhinorrhea.     Mouth/Throat:     Pharynx: No oropharyngeal exudate or posterior oropharyngeal erythema.  Eyes:     Conjunctiva/sclera: Conjunctivae normal.     Pupils: Pupils are equal, round, and reactive to light.  Cardiovascular:     Rate and Rhythm: Normal rate and regular rhythm.     Heart sounds: No murmur heard.   No friction rub. No gallop.  Pulmonary:     Effort: No respiratory distress.     Breath sounds: No stridor. No wheezing or rhonchi.  Chest:     Chest wall: No tenderness.  Abdominal:     General: Abdomen is flat. Bowel sounds are normal. There is no distension.     Palpations:   Abdomen is soft. There is no mass.     Tenderness: There is no abdominal tenderness. There is no guarding.  Musculoskeletal:        General: No swelling or deformity.     Cervical back: Normal range of motion and neck supple. No rigidity or tenderness.     Right lower leg: No edema.     Left lower leg: No edema.  Skin:    General: Skin is warm and dry.     Coloration: Skin is not jaundiced.     Findings: No erythema.  Neurological:     Mental Status: She is alert and oriented to person, place, and time. Mental status is at baseline.  Psychiatric:        Mood and Affect: Mood normal.        Behavior: Behavior normal.        Thought Content: Thought content normal.        Judgment: Judgment normal.    Results for orders placed or performed in visit on 01/23/21  TSH  Result Value Ref Range   TSH 6.440 (H) 0.450 - 4.500 uIU/mL  Comprehensive metabolic panel  Result Value Ref Range   Glucose 84 70 - 99 mg/dL   BUN 11 6 - 24 mg/dL   Creatinine, Ser 1.15 (H) 0.57 - 1.00 mg/dL   eGFR 57 (L) >59  mL/min/1.73   BUN/Creatinine Ratio 10 9 - 23   Sodium 138 134 - 144 mmol/L   Potassium 4.3 3.5 - 5.2 mmol/L   Chloride 101 96 - 106 mmol/L   CO2 20 20 - 29 mmol/L   Calcium 9.2 8.7 - 10.2 mg/dL   Total Protein 7.0 6.0 - 8.5 g/dL   Albumin 4.1 3.8 - 4.9 g/dL   Globulin, Total 2.9 1.5 - 4.5 g/dL   Albumin/Globulin Ratio 1.4 1.2 - 2.2   Bilirubin Total 0.6 0.0 - 1.2 mg/dL   Alkaline Phosphatase 117 44 - 121 IU/L   AST 17 0 - 40 IU/L   ALT 25 0 - 32 IU/L  CBC with Differential/Platelet  Result Value Ref Range   WBC 8.3 3.4 - 10.8 x10E3/uL   RBC 4.62 3.77 - 5.28 x10E6/uL   Hemoglobin 14.4 11.1 - 15.9 g/dL   Hematocrit 42.0 34.0 - 46.6 %   MCV 91 79 - 97 fL   MCH 31.2 26.6 - 33.0 pg   MCHC 34.3 31.5 - 35.7 g/dL   RDW 13.2 11.7 - 15.4 %   Platelets 324 150 - 450 x10E3/uL   Neutrophils 53 Not Estab. %   Lymphs 37 Not Estab. %   Monocytes 6 Not Estab. %   Eos 2 Not Estab. %   Basos 1 Not Estab. %   Neutrophils Absolute 4.4 1.4 - 7.0 x10E3/uL   Lymphocytes Absolute 3.1 0.7 - 3.1 x10E3/uL   Monocytes Absolute 0.5 0.1 - 0.9 x10E3/uL   EOS (ABSOLUTE) 0.2 0.0 - 0.4 x10E3/uL   Basophils Absolute 0.1 0.0 - 0.2 x10E3/uL   Immature Granulocytes 1 Not Estab. %   Immature Grans (Abs) 0.0 0.0 - 0.1 x10E3/uL        Current Outpatient Medications:    albuterol (VENTOLIN HFA) 108 (90 Base) MCG/ACT inhaler, INHALE 2 PUFFS BY MOUTH EVERY 6 HOURS AS NEEDED FOR WHEEZING FOR SHORTNESS OF BREATH, Disp: 7 each, Rfl: 0   albuterol (VENTOLIN HFA) 108 (90 Base) MCG/ACT inhaler, Inhale 1 puff into the lungs every 6 (six) hours as needed for wheezing or  shortness of breath., Disp: 16 g, Rfl: 6   amLODipine (NORVASC) 10 MG tablet, Take 1 tablet (10 mg total) by mouth daily., Disp: 30 tablet, Rfl: 11   atorvastatin (LIPITOR) 20 MG tablet, Take 1 tablet by mouth once daily, Disp: 30 tablet, Rfl: 0   buPROPion (WELLBUTRIN XL) 150 MG 24 hr tablet, Take 1 tablet (150 mg total) by mouth daily., Disp: 30 tablet,  Rfl: 5   famotidine (PEPCID) 20 MG tablet, Take 1 tablet (20 mg total) by mouth at bedtime., Disp: 30 tablet, Rfl: 3   Homeopathic Products (RESTFUL LEGS SL), Place 1 tablet under the tongue at bedtime., Disp: , Rfl:    ibuprofen (ADVIL) 200 MG tablet, Take 200 mg by mouth every 4 (four) hours as needed (patient states that she takes 6 at a time 5 or 6 times a day). Education- will do pharmacy referral, Disp: , Rfl:    levothyroxine (SYNTHROID) 50 MCG tablet, Take 1 tablet (50 mcg total) by mouth daily before breakfast., Disp: 30 tablet, Rfl: 4   losartan (COZAAR) 50 MG tablet, Take 1 tablet (50 mg total) by mouth daily., Disp: 30 tablet, Rfl: 3   nystatin cream (MYCOSTATIN), Apply topically 2 (two) times daily., Disp: 30 g, Rfl: 0   pramipexole (MIRAPEX) 0.125 MG tablet, Take 1 tablet (0.125 mg total) by mouth every evening., Disp: 30 tablet, Rfl: 1   predniSONE (DELTASONE) 50 MG tablet, Use Oral prednisone 50 mg at  13 hours  7 hours  and  1 hour prior to contrast administration. (Patient not taking: Reported on 01/23/2021), Disp: 3 tablet, Rfl: 0   spironolactone (ALDACTONE) 25 MG tablet, Take 0.5 tablets (12.5 mg total) by mouth daily., Disp: 15 tablet, Rfl: 0   Tiotropium Bromide Monohydrate (SPIRIVA RESPIMAT) 2.5 MCG/ACT AERS, Inhale 2 puffs into the lungs daily., Disp: 1 each, Rfl: 5   VITAMIN D, ERGOCALCIFEROL, PO, Take 5,000 Int'l Units/day by mouth daily., Disp: , Rfl:     Assessment & Plan:  HTN is on losartan and spironolactone  HTN :  Continue current meds.  Medication compliance emphasised. pt advised to keep Bp logs. Pt verbalised understanding of the same. Pt to have a low salt diet . Exercise to reach a goal of at least 150 mins a week.  lifestyle modifications explained and pt understands importance of the above.   2. Elevated TSH will increase dose of synthroid to 75 mcg HYPOTHYROIDISM PLEASE TAKE YOUR THYROID MEDICATION FIRST THING IN THE MORNING WHILST FASTING.  NO  MEDICATION/ FOOD FOR AN HOUR AFTER INGESTING THYROID PILLS.  3. RLS mirapex helping but some pain continues in lower extremities  Increase mirapex to 0.25 mg for RLS   4. Back pain  : Check xrays of L spine  Will start on , start  Pt on Physical therapy.   Problem List Items Addressed This Visit   None Visit Diagnoses     Prediabetes    -  Primary   Relevant Orders   Microalbumin, Urine Waived   Need for pneumococcal vaccine       Relevant Orders   Pneumococcal conjugate vaccine 13-valent IM        Orders Placed This Encounter  Procedures   Pneumococcal conjugate vaccine 13-valent IM   Microalbumin, Urine Waived     No orders of the defined types were placed in this encounter.    Follow up plan: No follow-ups on file.  

## 2021-03-02 ENCOUNTER — Ambulatory Visit: Payer: 59 | Admitting: Physical Therapy

## 2021-03-04 ENCOUNTER — Encounter: Payer: 59 | Admitting: Physical Therapy

## 2021-03-10 ENCOUNTER — Ambulatory Visit: Payer: 59 | Attending: Internal Medicine | Admitting: Physical Therapy

## 2021-03-10 ENCOUNTER — Encounter: Payer: 59 | Admitting: Physical Therapy

## 2021-03-11 ENCOUNTER — Telehealth: Payer: 59

## 2021-03-11 ENCOUNTER — Telehealth: Payer: Self-pay

## 2021-03-11 NOTE — Telephone Encounter (Signed)
  Care Management   Follow Up Note   03/11/2021 Name: Jodi Keith MRN: [id number] DOB: [birth year]   Referred by: [name], MD Reason for referral : Chronic Care Management and Care Coordination (RNCM: Follow up for Chronic Disease Management and Care Coordination Needs )   An unsuccessful telephone outreach was attempted today. The patient was referred to the case management team for assistance with care management and care coordination.   Follow Up Plan: A HIPPA compliant phone message was left for the patient providing contact information and requesting a return call.  Alto Denver RN, MSN, CCM Community Care Coordinator [place]  [facility] Mobile: [phone]

## 2021-03-17 ENCOUNTER — Encounter: Payer: 59 | Admitting: Physical Therapy

## 2021-03-19 ENCOUNTER — Telehealth: Payer: 59

## 2021-03-19 ENCOUNTER — Encounter: Payer: 59 | Admitting: Physical Therapy

## 2021-03-23 ENCOUNTER — Telehealth: Payer: Self-pay | Admitting: Licensed Clinical Social Worker

## 2021-03-23 NOTE — Telephone Encounter (Signed)
    Clinical Social Work  Care Management   Phone Outreach    03/23/2021 Name: Jodi Keith MRN: [id number] DOB: [birth year]  Jodi Keith is a 55 y.o. year old female who is a primary care patient of Vigg, Avanti, MD .   Reason for referral: Mental Health Counseling and Resources.    F/U phone call today to assess needs, progress and barriers with care plan goals.   Telephone outreach was unsuccessful. A HIPPA compliant phone message was left for the patient providing contact information and requesting a return call.   Plan:CCM LCSW will wait for return call. If no return call is received, Will route chart to Care Guide to see if patient would like to reschedule phone appointment   Review of patient status, including review of consultants reports, relevant laboratory and other test results, and collaboration with appropriate care team members and the patient's provider was performed as part of comprehensive patient evaluation and provision of care management services.    Jodi Keith, MSW, LCSW Jodi Keith  [email or web address] Phone [phone] 7:18 AM

## 2021-03-24 ENCOUNTER — Encounter: Payer: 59 | Admitting: Physical Therapy

## 2021-03-26 ENCOUNTER — Encounter: Payer: 59 | Admitting: Physical Therapy

## 2021-03-31 ENCOUNTER — Encounter: Payer: 59 | Admitting: Physical Therapy

## 2021-04-02 ENCOUNTER — Encounter: Payer: 59 | Admitting: Physical Therapy

## 2021-04-07 ENCOUNTER — Telehealth: Payer: Self-pay

## 2021-04-07 ENCOUNTER — Encounter: Payer: 59 | Admitting: Physical Therapy

## 2021-04-07 NOTE — Chronic Care Management (AMB) (Signed)
°  Care Management   Note  04/07/2021 Name: Jodi Keith MRN: [id number] DOB: 1965/06/30  Jodi Keith is a 55 y.o. year old female who is a primary care patient of Vigg, Avanti, MD and is actively engaged with the care management team. I reached out to Tobey Bride by phone today to assist with re-scheduling a follow up visit with the Licensed Clinical Social Worker  Follow up plan: Unsuccessful telephone outreach attempt made. A HIPAA compliant phone message was left for the patient providing contact information and requesting a return call.  The care management team will reach out to the patient again over the next 7 days.  If patient returns call to provider office, please advise to call Embedded Care Management Care Guide Penne Lash  at [phone]  Penne Lash, RMA Care Guide, Embedded Care Coordination [facility]  [place], [place] [address] Direct Dial: [phone] [email or web address]

## 2021-04-09 ENCOUNTER — Encounter: Payer: 59 | Admitting: Physical Therapy

## 2021-04-14 ENCOUNTER — Encounter: Payer: 59 | Admitting: Physical Therapy

## 2021-04-14 NOTE — Chronic Care Management (AMB) (Signed)
°  Care Management   Note  04/14/2021 Name: Jodi Keith MRN: [id number] DOB: 03/27/1966  Jodi Keith is a 55 y.o. year old female who is a primary care patient of Vigg, Avanti, MD and is actively engaged with the care management team. I reached out to Tobey Bride by phone today to assist with re-scheduling a follow up visit with the Licensed Clinical Social Worker  Follow up plan: Unsuccessful telephone outreach attempt made. A HIPAA compliant phone message was left for the patient providing contact information and requesting a return call.  The care management team will reach out to the patient again over the next 7 days.  If patient returns call to provider office, please advise to call Embedded Care Management Care Guide Penne Lash  at [phone]  Penne Lash, RMA Care Guide, Embedded Care Coordination [facility]  [place], [place] [address] Direct Dial: [phone] [email or web address]: [email or web address]

## 2021-04-16 ENCOUNTER — Encounter: Payer: 59 | Admitting: Physical Therapy

## 2021-04-16 ENCOUNTER — Other Ambulatory Visit: Payer: Self-pay | Admitting: Internal Medicine

## 2021-04-16 NOTE — Telephone Encounter (Signed)
Requested Prescriptions  Pending Prescriptions Disp Refills   pramipexole (MIRAPEX) 0.125 MG tablet [Pharmacy Med Name: Pramipexole Dihydrochloride 0.125 MG Oral Tablet] 30 tablet 0    Sig: Take 1 tablet by mouth in the evening     Neurology:  Parkinsonian Agents Passed - 04/16/2021 12:02 PM      Passed - Last BP in normal range    BP Readings from Last 1 Encounters:  02/20/21 98/62         Passed - Valid encounter within last 12 months    Recent Outpatient Visits          1 month ago Prediabetes   [facility] Vigg, Avanti, MD   2 months ago Need for influenza vaccination   [facility] Vigg, Avanti, MD   5 months ago LUQ pain   [facility] Vigg, Avanti, MD   6 months ago Morbid obesity (HCC)   [facility] Vigg, Avanti, MD   6 months ago Asthma case management patient   [facility] Vigg, Avanti, MD      Future Appointments            In 2 months Vigg, Avanti, MD [facility], [facility]

## 2021-04-17 ENCOUNTER — Ambulatory Visit: Payer: Self-pay

## 2021-04-17 NOTE — Telephone Encounter (Signed)
Chief Complaint: COVID positive 04/15/21 Symptoms: Nasal congestion, wheezing, fever, body aches, chills, fatigue, weakness, headache, wheezing, diarrhea, vomiting Frequency: Symptoms started on 04/13/21 Pertinent Negatives: Patient denies SOB, CP Disposition: [] ED /[] Urgent Care (no appt availability in office) / [] Appointment(In office/virtual)/ [x]  [facility]/ [] Home Care/ [] Refused Recommended Disposition /[] [facility]/ []  Follow-up with PCP Additional Notes: No availability in the office today.    Reason for Disposition  Fever present > 3 days (72 hours)  Answer Assessment - Initial Assessment Questions 1. COVID-19 DIAGNOSIS: "Who made your COVID-19 diagnosis?" "Was it confirmed by a positive lab test or self-test?" If not diagnosed by a doctor (or NP/PA), ask "Are there lots of cases (community spread) where you live?" Note: See public health department website, if unsure.     Home test on Wednesday positive 2. COVID-19 EXPOSURE: "Was there any known exposure to COVID before the symptoms began?" CDC Definition of close contact: within 6 feet (2 meters) for a total of 15 minutes or more over a 24-hour period.      Unknown 3. ONSET: "When did the COVID-19 symptoms start?"      Monday 4. WORST SYMPTOM: "What is your worst symptom?" (e.g., cough, fever, shortness of breath, muscle aches)     Nasal congestion 5. COUGH: "Do you have a cough?" If Yes, ask: "How bad is the cough?"       No 6. FEVER: "Do you have a fever?" If Yes, ask: "What is your temperature, how was it measured, and when did it start?"     High as 101.2, no temp today due to taking Tylenol 7. RESPIRATORY STATUS: "Describe your breathing?" (e.g., shortness of breath, wheezing, unable to speak)      Wheezing-patient reports hearing it 8. BETTER-SAME-WORSE: "Are you getting better, staying the same or getting worse compared to yesterday?"  If getting worse, ask, "In what way?"     Somewhat  better 9. HIGH RISK DISEASE: "Do you have any chronic medical problems?" (e.g., asthma, heart or lung disease, weak immune system, obesity, etc.)     N/A 10. VACCINE: "Have you had the COVID-19 vaccine?" If Yes, ask: "Which one, how many shots, when did you get it?"       N/A 11. BOOSTER: "Have you received your COVID-19 booster?" If Yes, ask: "Which one and when did you get it?"       N/A 12. PREGNANCY: "Is there any chance you are pregnant?" "When was your last menstrual period?"       N/A 13. OTHER SYMPTOMS: "Do you have any other symptoms?"  (e.g., chills, fatigue, headache, loss of smell or taste, muscle pain, sore throat)       Body ache, fatigue, weakness, chills, diarrhea, vomiting 14. O2 SATURATION MONITOR:  "Do you use an oxygen saturation monitor (pulse oximeter) at home?" If Yes, ask "What is your reading (oxygen level) today?" "What is your usual oxygen saturation reading?" (e.g., 95%)       N/A  Protocols used: Coronavirus (COVID-19) Diagnosed or Suspected-A-AH

## 2021-04-23 NOTE — Chronic Care Management (AMB) (Signed)
°  Care Management   Note  04/23/2021 Name: Jodi Keith MRN: [id number] DOB: 22-Oct-1965  Jodi Keith is a 56 y.o. year old female who is a primary care patient of Vigg, Avanti, MD and is actively engaged with the care management team. I reached out to Tobey Bride by phone today to assist with re-scheduling a follow up visit with the Licensed Clinical Social Worker  Follow up plan: Unable to make contact on outreach attempts x 3. PCP Vigg, Avanti, MD notified via routed documentation in medical record.   Penne Lash, RMA Care Guide, Embedded Care Coordination [facility]  [place], [place] [address] Direct Dial: [phone] [email or web address]: [email or web address]

## 2021-04-23 NOTE — Telephone Encounter (Signed)
3rd unsuccessful outreach  

## 2021-04-24 ENCOUNTER — Telehealth: Payer: Self-pay

## 2021-04-24 ENCOUNTER — Telehealth: Payer: 59

## 2021-04-24 NOTE — Telephone Encounter (Signed)
°  Care Management   Follow Up Note   04/24/2021 Name: Jodi Keith MRN: [id number] DOB: February 06, 1966   Referred by: [name], MD Reason for referral : Care Coordination (RNCM: Follow up for Chronic Disease Management and Care Coordination Needs )   An unsuccessful telephone outreach was attempted today. The patient was referred to the case management team for assistance with care management and care coordination.   Follow Up Plan: A HIPPA compliant phone message was left for the patient providing contact information and requesting a return call.   Alto Denver RN, MSN, CCM Community Care Coordinator [place]   [facility] Mobile: [phone]

## 2021-05-06 ENCOUNTER — Ambulatory Visit: Payer: BLUE CROSS/BLUE SHIELD | Attending: Obstetrics and Gynecology

## 2021-05-07 ENCOUNTER — Ambulatory Visit: Payer: 59 | Admitting: Obstetrics and Gynecology

## 2021-05-20 ENCOUNTER — Other Ambulatory Visit: Payer: Self-pay | Admitting: Internal Medicine

## 2021-05-21 NOTE — Telephone Encounter (Signed)
Synthroid dose inconsistent with current med list. Requested Prescriptions  Pending Prescriptions Disp Refills   SPIRIVA RESPIMAT 2.5 MCG/ACT AERS [Pharmacy Med Name: Spiriva Respimat 2.5 MCG/ACT Inhalation Aerosol Solution] 4 g 0    Sig: INHALE 2 SPRAY(S) BY MOUTH ONCE DAILY     Pulmonology:  Anticholinergic Agents Passed - 05/20/2021  4:00 PM      Passed - Valid encounter within last 12 months    Recent Outpatient Visits          3 months ago Prediabetes   [facility] Vigg, Avanti, MD   3 months ago Need for influenza vaccination   [facility] Vigg, Avanti, MD   6 months ago LUQ pain   [facility] Vigg, Avanti, MD   7 months ago Morbid obesity (HCC)   [facility] Vigg, Avanti, MD   7 months ago Asthma case management patient   [facility] Vigg, Avanti, MD      Future Appointments            In 4 weeks Vigg, Avanti, MD [facility], [facility]            levothyroxine (SYNTHROID) 50 MCG tablet [Pharmacy Med Name: Levothyroxine Sodium 50 MCG Oral Tablet] 30 tablet 0    Sig: TAKE 1 TABLET BY MOUTH ONCE DAILY BEFORE BREAKFAST     Endocrinology:  Hypothyroid Agents Failed - 05/20/2021  4:00 PM      Failed - TSH in normal range and within 360 days    TSH  Date Value Ref Range Status  01/23/2021 6.440 (H) 0.450 - 4.500 uIU/mL Final         Passed - Valid encounter within last 12 months    Recent Outpatient Visits          3 months ago Prediabetes   [facility] Vigg, Avanti, MD   3 months ago Need for influenza vaccination   [facility] Vigg, Avanti, MD   6 months ago LUQ pain   [facility] Vigg, Avanti, MD   7 months ago Morbid obesity (HCC)   [facility] Vigg, Avanti, MD   7 months ago Asthma case management patient   [facility] Vigg, Avanti, MD      Future Appointments            In 4 weeks Vigg, Avanti, MD [facility], [facility]

## 2021-05-22 ENCOUNTER — Telehealth: Payer: BLUE CROSS/BLUE SHIELD

## 2021-05-22 ENCOUNTER — Ambulatory Visit: Payer: Self-pay

## 2021-05-22 DIAGNOSIS — G8929 Other chronic pain: Secondary | ICD-10-CM

## 2021-05-22 DIAGNOSIS — F419 Anxiety disorder, unspecified: Secondary | ICD-10-CM

## 2021-05-22 DIAGNOSIS — E039 Hypothyroidism, unspecified: Secondary | ICD-10-CM

## 2021-05-22 DIAGNOSIS — I10 Essential (primary) hypertension: Secondary | ICD-10-CM

## 2021-05-22 DIAGNOSIS — F17200 Nicotine dependence, unspecified, uncomplicated: Secondary | ICD-10-CM

## 2021-05-22 DIAGNOSIS — F339 Major depressive disorder, recurrent, unspecified: Secondary | ICD-10-CM

## 2021-05-22 DIAGNOSIS — J45901 Unspecified asthma with (acute) exacerbation: Secondary | ICD-10-CM

## 2021-05-22 NOTE — Patient Instructions (Signed)
Visit Information  Thank you for taking time to visit with me today. Please don't hesitate to contact me if I can be of assistance to you before our next scheduled telephone appointment.  Following are the goals we discussed today:  RNCM Clinical Goal(s):  Patient will verbalize basic understanding of HTN, Anxiety, Depression, Pulmonary Disease, Hypothyroidism, smoker,  and Chronic pain  disease process and self health management plan as evidenced by keeping appointments, following the plan of care, following dietary restrictions, adequate rest, and working with the CCM team to effectively manage health and well being. take all medications exactly as prescribed and will call provider for medication related questions as evidenced by compliance with medications and calling for refills before running out. The patient is having issues with her insurance as she switched insurances and her medications were going to cost >600.00. The patient has called and went back to her old insurance but it will not be in effect until June 17, 2021. The patient is going to pick up medications today all but the Spiriva and she has some of that on hand.     attend all scheduled medical appointments: 06-19-2021 at 10 am as evidenced by keeping appointments and calling for schedule change needs         demonstrate improved and ongoing adherence to prescribed treatment plan for HTN, Anxiety, Depression, Pulmonary Disease, Hypothyroidism, smoker, and chronic pain  as evidenced by calling the office for questions and concerns, keeping appointments, no exacerbation of conditions, stable VS, routine labwork and following established plan of care.  continue to work with Consulting civil engineer and/or Social Worker to address care management and care coordination needs related to HTN, Anxiety, Depression, Pulmonary Disease, Hypothyroidism, Tobacco Use, and Chronic pain  as evidenced by adherence to CM Team Scheduled appointments     work with  Education officer, museum to address [place] Concerns  related to the management of HTN, Anxiety, Depression, Pulmonary Disease, Hypothyroidism, Tobacco Use, and chronic pain  as evidenced by review of EMR and patient or social worker report     demonstrate ongoing self health care management ability for effective management of chronic conditions as evidenced by working with the CCM team through collaboration with Consulting civil engineer, provider, and care team.    Interventions: 1:1 collaboration with primary care provider regarding development and update of comprehensive plan of care as evidenced by provider attestation and co-signature Inter-disciplinary care team collaboration (see longitudinal plan of care) Evaluation of current treatment plan related to  self management and patient's adherence to plan as established by provider     Asthma: (Status: Goal on Track (progressing): YES.) Long Term Goal  Provided patient with basic written and verbal Asthma education on self care/management/and exacerbation prevention; Advised patient to track and manage Asthma triggers. 05-22-2021: The patient is aware of triggers that cause exacerbation. Denies any issues with her asthma at this time;  Provided written and verbal instructions on pursed lip breathing and utilized returned demonstration as teach back; Provided instruction about proper use of medications used for management of Asthma including inhalers; Advised patient to self assesses Asthma action plan zone and make appointment with provider if in the yellow zone for 48 hours without improvement; Advised patient to engage in light exercise as tolerated 3-5 days a week to aid in the the management of Asthma; Provided education about and advised patient to utilize infection prevention strategies to reduce risk of respiratory infection; Discussed the importance of adequate rest and management  of fatigue with Asthma; Screening for signs and symptoms of depression  related to chronic disease state;  Assessed social determinant of health barriers;      Hypothyroidism  (Status: Goal on Track (progressing): YES.) Long Term Goal  Evaluation of current treatment plan related to Hypothyroidism, Mental Health Concerns  self-management and patient's adherence to plan as established by provider. Discussed plans with patient for ongoing care management follow up and provided patient with direct contact information for care management team Advised patient to call the office for changes in condition, new concerns or questions related to hypothyroidism; Provided education to patient re: importance of taking medications as prescribed. The patient was having issues with getting medications due to changes in insurances but she is switching back and the patient should be able to get her synthroid; Reviewed medications with patient and discussed compliance and taking as directed. 30 minutes to an hour before eating and not taking with other medications. ; Provided patient with medications, sx and sx to look for with Hypothyroidism educational materials related to effective management of hypothyroidism; Reviewed scheduled/upcoming provider appointments including 06-19-2021 at 10 am; Discussed plans with patient for ongoing care management follow up and provided patient with direct contact information for care management team; Advised patient to discuss lab work and other questions and concerns related to hypothyroidism with provider;    Depression and Anxiety  (Status: Goal on Track (progressing): YES.) Long Term Goal  Evaluation of current treatment plan related to Anxiety and Depression, Mental Health Concerns  self-management and patient's adherence to plan as established by provider. 05-22-2021: The patient is doing well and was excited to tell the [facility] about her new adventure. 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 She states that this is giving her  purpose in her life and now that she looks back at it she was having the "empty nest syndrome". The 3 oldest children will go back with their mother when she finds stable housing but her and her husband are considering adopting the 25 and 55 year old because their parents are in prison. The patient states this is helping her so much and she is so glad she did it.  Discussed plans with patient for ongoing care management follow up and provided patient with direct contact information for care management team Advised patient to call the office for changes in mood, anxiety, depression, or any questions or concerns with mental health conditions; Provided education to patient re: working with the CCM team to optimize mental health well being. The patient is happy and doing well today. She has new purpose in her life and is happy to be fostering 5 children; Reviewed medications with patient and discussed compliance. The patient states compliance with medications; Reviewed scheduled/upcoming provider appointments including 06-19-2021 at 10 am; Social Work referral for ongoing support and education; Discussed plans with patient for ongoing care management follow up and provided patient with direct contact information for care management team; Advised patient to discuss new concerns or questions about mental health with provider;   Hypertension: (Status: Goal on Track (progressing): YES.) Long Term Goal  Last practice recorded BP readings:     BP Readings from Last 3 Encounters:  02/20/21 98/62  01/23/21 127/63  01/02/21 130/72  Most recent eGFR/CrCl:       Lab Results  Component Value Date    EGFR 57 (L) 01/23/2021    No components found for: CRCL   Evaluation of  current treatment plan related to hypertension self management and patient's adherence to plan as established by provider;   Provided education to patient re: stroke prevention, s/s of heart attack and stroke; Reviewed prescribed diet heart  healthy diet  Reviewed medications with patient and discussed importance of compliance;  Discussed plans with patient for ongoing care management follow up and provided patient with direct contact information for care management team; Advised patient, providing education and rationale, to monitor blood pressure daily and record, calling PCP for findings outside established parameters;  Advised patient to discuss blood pressure trends  with provider; Provided education on prescribed diet heart healthy diet ;  Discussed complications of poorly controlled blood pressure such as heart disease, stroke, circulatory complications, vision complications, kidney impairment, sexual dysfunction;    Pain:  (Status: Goal on Track (progressing): YES.) Long Term Goal  Pain assessment performed. Rates her buttocks pain today at a 9. She fell before Christmas and she feels like she may have fractured her tail bone but she states there is nothing they can do for that. The patient has generalized joint pain and discomfort and rates this at a 4 today. She states this is tolerable Medications reviewed. 05-22-2021: Is compliant with medications  Reviewed provider established plan for pain management; Discussed importance of adherence to all scheduled medical appointments; Counseled on the importance of reporting any/all new or changed pain symptoms or management strategies to pain management provider; Advised patient to report to care team affect of pain on daily activities; Discussed use of relaxation techniques and/or diversional activities to assist with pain reduction (distraction, imagery, relaxation, massage, acupressure, TENS, heat, and cold application; Reviewed with patient prescribed pharmacological and nonpharmacological pain relief strategies; Advised patient to discuss unresolved pain, changes in level or intensity of pain with provider;     Smoking Cessation: (Status: Goal on Track (progressing): YES.) Long  Term Goal  Reviewed smoking history:  tobacco abuse of >30 years; currently smoking 1 ppd Previous quit attempts, unsuccessful several successful using 3 times when she was pregnant with her 3 children  Reports smoking within 30 minutes of waking up Reports triggers to smoke include: stress Reports motivation to quit smoking includes: has new purpose in her life with being a foster parent to 5 minors On a scale of 1-10, reports MOTIVATION to quit is 8 On a scale of 1-10, reports CONFIDENCE in quitting is 8   Evaluation of current treatment plan reviewed; Advised patient to discuss smoking cessation options with provider; Provided contact information for [place] [facility]uit [facility] (1-800-QUIT-NOW); Provided patient with printed smoking cessation educational materials; Reviewed scheduled/upcoming provider appointments including: 06-19-2021 at 10 am; Discussed plans with patient for ongoing care management follow up and provided patient with direct contact information for care management team; Advised patient to discuss smoking cessation options, new concerns or questions with respiratory health with provider;   Patient Goals/Self-Care Activities: Take medications as prescribed   Attend all scheduled provider appointments Call pharmacy for medication refills 3-7 days in advance of running out of medications Attend church or other social activities Perform all self care activities independently  Perform IADL's (shopping, preparing meals, housekeeping, managing finances) independently Call provider office for new concerns or questions  Work with the social worker to address care coordination needs and will continue to work with the clinical team to address health care and disease management related needs call the Suicide and Crisis Lifeline: 988 call the [place] National Suicide Prevention Lifeline: 774-377-7067 or TTY: 458-795-8130 TTY 818-501-7028)  to talk to a trained counselor call 1-800-273-TALK (toll  free, 24 hour hotline) if experiencing a Mental Health or [place]  - avoid second hand smoke - eliminate smoking in my home - identify and avoid work-related triggers - identify and remove indoor air pollutants - limit outdoor activity during cold weather - listen for public air quality announcements every day - do breathing exercises every day - develop a rescue plan - eliminate symptom triggers at home - follow rescue plan if symptoms flare-up - keep follow-up appointments: 06-19-2021 at 10 am - use an extra pillow to sleep - develop a new routine to improve sleep - don't eat or exercise right before bedtime - eat healthy/prescribed diet: heart healthy/ADA diet (pre-DM) - get at least 7 to 8 hours of sleep at night - use devices that will help like a cane, sock-puller or reacher - practice relaxation or meditation daily - do exercises in a comfortable position that makes breathing as easy as possible check blood pressure 3 times per week choose a place to take my blood pressure (home, clinic or office, retail store) write blood pressure results in a log or diary learn about high blood pressure keep a blood pressure log take blood pressure log to all doctor appointments call doctor for signs and symptoms of high blood pressure develop an action plan for high blood pressure keep all doctor appointments take medications for blood pressure exactly as prescribed report new symptoms to your doctor eat more whole grains, fruits and vegetables, lean meats and healthy fats      Our next appointment is by telephone on 07-10-2021 at 230 pm  Please call the care guide team at [phone] if you need to cancel or reschedule your appointment.   If you are experiencing a Mental Health or [place] or need someone to talk to, please call the Suicide and Crisis Lifeline: 988 call the [place] National Suicide Prevention Lifeline: (660) 071-2746 or TTY: 640-325-2587  TTY 520-786-6682) to talk to a trained counselor call 1-800-273-TALK (toll free, 24 hour hotline)   Patient verbalizes understanding of instructions and care plan provided today and agrees to view in [place]. Active MyChart status confirmed with patient.    Telephone follow up appointment with care management team member scheduled for: 07-10-2021 at 230 pm  Noreene Larsson RN, MSN, [facility] Mobile: [phone]

## 2021-05-22 NOTE — Chronic Care Management (AMB) (Signed)
Care Management    RN Visit Note  05/22/2021 Name: Mckinsley Koelzer MRN: [id number] DOB: 1966/01/11  Subjective: Laniece Hornbaker is a 56 y.o. year old female who is a primary care patient of Vigg, Avanti, MD. The care management team was consulted for assistance with disease management and care coordination needs.    Engaged with patient by telephone for follow up visit in response to provider referral for case management and/or care coordination services.   Consent to Services:   Ms. Lindfors was given information about Care Management services today including:  Care Management services includes personalized support from designated clinical staff supervised by her physician, including individualized plan of care and coordination with other care providers 24/7 contact phone numbers for assistance for urgent and routine care needs. The patient may stop case management services at any time by phone call to the office staff.  Patient agreed to services and consent obtained.   Assessment: Review of patient past medical history, allergies, medications, health status, including review of consultants reports, laboratory and other test data, was performed as part of comprehensive evaluation and provision of chronic care management services.   SDOH (Social Determinants of Health) assessments and interventions performed:    Care Plan  Allergies  Allergen Reactions   Hydrochlorothiazide Other (See Comments)    Causes severe muscle cramps   Prochlorperazine Edisylate Anaphylaxis    coma   Contrast Media [Iodinated Contrast Media] Palpitations and Other (See Comments)    Patient had severe hypotension, rapid heart beat after initial contrast injection "years ago." For recent procedure using IV contrast patient had appropriate 13 hour premedication.     Outpatient Encounter Medications as of 05/22/2021  Medication Sig   albuterol (VENTOLIN HFA) 108 (90 Base) MCG/ACT inhaler INHALE 2 PUFFS BY  MOUTH EVERY 6 HOURS AS NEEDED FOR WHEEZING FOR SHORTNESS OF BREATH   albuterol (VENTOLIN HFA) 108 (90 Base) MCG/ACT inhaler Inhale 1 puff into the lungs every 6 (six) hours as needed for wheezing or shortness of breath.   amLODipine (NORVASC) 10 MG tablet Take 1 tablet (10 mg total) by mouth daily.   atorvastatin (LIPITOR) 20 MG tablet Take 1 tablet by mouth once daily   buPROPion (WELLBUTRIN XL) 150 MG 24 hr tablet Take 1 tablet (150 mg total) by mouth daily.   famotidine (PEPCID) 20 MG tablet Take 1 tablet (20 mg total) by mouth at bedtime.   Homeopathic Products (RESTFUL LEGS SL) Place 1 tablet under the tongue at bedtime.   ibuprofen (ADVIL) 200 MG tablet Take 200 mg by mouth every 4 (four) hours as needed (patient states that she takes 6 at a time 5 or 6 times a day). Education- will do pharmacy referral   levothyroxine (SYNTHROID) 75 MCG tablet Take 1 tablet (75 mcg total) by mouth daily before breakfast.   nystatin cream (MYCOSTATIN) Apply topically 2 (two) times daily.   pramipexole (MIRAPEX) 0.25 MG tablet Take 1 tablet (0.25 mg total) by mouth every evening.   SPIRIVA RESPIMAT 2.5 MCG/ACT AERS INHALE 2 SPRAY(S) BY MOUTH ONCE DAILY   spironolactone (ALDACTONE) 25 MG tablet Take 0.5 tablets (12.5 mg total) by mouth daily.   VITAMIN D, ERGOCALCIFEROL, PO Take 5,000 Int'l Units/day by mouth daily.   No facility-administered encounter medications on file as of 05/22/2021.    Patient Active Problem List   Diagnosis Date Noted   Prediabetes 02/20/2021   Need for pneumococcal vaccine 02/20/2021   Chronic right-sided back pain 02/20/2021  Smoking addiction 01/23/2021   Chronic right-sided low back pain with right-sided sciatica 01/23/2021   RLS (restless legs syndrome) 01/23/2021   Depression, recurrent ([place]) 01/23/2021   Primary hypertension 01/23/2021   Need for influenza vaccination 01/23/2021   HTN (hypertension) 01/02/2021   Cramp in limb 01/02/2021   Asthma case management  patient 09/30/2020   SOB (shortness of breath) 09/30/2020   LUQ pain 09/30/2020   Hypothyroidism 09/30/2020   Polycythemia 09/30/2020   Moderate asthma with exacerbation 09/17/2020   Hypertensive emergency 05/02/2018    Conditions to be addressed/monitored: HTN, Anxiety, Depression, Pulmonary Disease, Hypothyroidism, Tobacco Use, and chronic pain   Care Plan : RNCM: Fall Risk (Adult)  Updates made by Vanita Ingles, RN since 05/22/2021 12:00 AM  Completed 05/22/2021   Problem: RNCM: Fall Risk Resolved 05/22/2021  Priority: High     Long-Range Goal: RNCM: Absence of Fall and Fall-Related Injury Completed 05/22/2021  Start Date: 08/26/2020  Expected End Date: 07/22/2021  Recent Progress: On track  Priority: High  Note:    Current Barriers: Resolving, duplicate goal Knowledge Deficits related to fall precautions in patient with chronic pain and weakness in legs, causing falls at times  Decreased adherence to prescribed treatment for fall prevention Unable to independently manage safety in the home as evidence of fall over the past weekend due to leg weakness and giving away Does not contact provider office for questions/concerns Knowledge Deficits related to fall prevention and safety  Chronic Disease Management support and education needs related to preventing falls in a patient with multiple chronic conditions with recent fall without injury  Clinical Goal(s):  patient will demonstrate improved adherence to prescribed treatment plan for decreasing falls as evidenced by patient reporting and review of EMR patient will verbalize using fall risk reduction strategies discussed patient will not experience additional falls patient will verbalize understanding of plan for effective management of falls prevention and safety patient will work with RNCM, pcp, and CCM team  to address needs related to falls and safety in the patients environment  patient will attend all scheduled medical appointments:  Encouraged the patient to call and make an appointment for follow up with the pcp.  patient will demonstrate improved adherence to prescribed treatment plan for falls prevention and safety  as evidenced byno new falls, being safe in her environment and working with the CCM team and pcp to optimize health and well being.  Interventions:  Collaboration with Charlynne Cousins, MD regarding development and update of comprehensive plan of care as evidenced by provider attestation and co-signature Inter-disciplinary care team collaboration (see longitudinal plan of care) Provided written and verbal education re: Potential causes of falls and Fall prevention strategies Reviewed medications and discussed potential side effects of medications such as dizziness and frequent urination. 01-06-2021: Review of medications and the patient is mindful of medications that may cause potential fall risk.  Assessed for s/s of orthostatic hypotension. 11-04-2020: Blood pressures around 119/80 previously in the office. The patient denies any light-headedness or dizziness. Will continue to monitor for changes. Knows to change position slowly. 01-06-2021: No episodes of hypotension at this time. Assessed for falls since last encounter. Fall over the weekend of May 7th. 01-06-2021: Denies any new falls.  Assessed patients knowledge of fall risk prevention secondary to previously provided education. 11-04-2020: Education and support, the patient denies any new concerns with safety. Actually is walking at least 30 minutes BID and feels much better. States today is a "good day". 01-06-2021: The patient  was not having a good day. States that she has been in bed over a month and only gets up to use the bathroom and eat and goes back to bed. Denies any issues with falls at this time.  Assessed working status of life alert bracelet and patient adherence Provided patient information for fall alert systems Evaluation of current treatment plan  related to falls prevention and safety and patient's adherence to plan as established by provider. 01-06-2021: The patient is consistently working with the pcp, specialist and CCM team to manage her health and well being. She denies any new falls and is hopeful that she will soon know what is causing her health concerns.  Advised patient to call the office for new falls, for concerns, or questions  Provided education to patient re: being safe in her surroundings and monitoring triggers that cause falls. The patient states that she often falls because her legs give away and cause her to fall. Left leg weakness with last fall over the weekend. 11-04-2020: Praised the patient for increasing her activity level and building her muscle mass.  Encouraged her to keep walking daily. She also has been swimming with her grandchildren and this makes her happy. She states during the 4th of July her and her husband went to [employer] and this was an enjoyable time for her. 01-06-2021: The patient was not having the best of days today. She states the grandchildren have gone back to school and she just is not feeling the best. States she has no energy. Encouraged the patient to get up and schedule some things to do.  Reviewed medications with patient and discussed compliance. 01-06-2021: The patient states compliance with medications.  Provided patient with falls prevention and safety educational materials related to preventing falls through the [facility] and my chart system Reviewed scheduled/upcoming provider appointments including: The patient has no upcoming appointments, encouraged the patient to get a follow up appointment with Dr. Neomia Dear scheduled Discussed plans with patient for ongoing care management follow up and provided patient with direct contact information for care management team Self-Care Deficits:  Unable to independently manage safety as evidence of fall over the weekend, without injury Does not attend all  scheduled provider appointments Does not adhere to prescribed medication regimen Does not maintain contact with provider office Does not contact provider office for questions/concerns Patient Goals:  - Utilize safety appropriately with all ambulation- the patient currently does not use any DME for ambulation  - De-clutter walkways - Change positions slowly - Wear secure fitting shoes at all times with ambulation - Utilize home lighting for dim lit areas - Demonstrate self and pet awareness at all times - activities of daily living skills assessed - barriers to physical activity or exercise addressed - barriers to physical activity or exercise identified - barriers to safety identified - cognition assessed - cognitive-stimulating activities promoted - fall prevention plan reviewed and updated - fear of falling, loss of independence and pain acknowledged - medication list reviewed - modification of home and work environment promoted - vision and/or hearing aid use promoted Follow Up Plan: Telephone follow up appointment with care management team member scheduled for: 03-11-2021 at 1030 am    Care Plan : RNCM: Chronic Pain (Adult)- bilateral legs and left side  Updates made by Vanita Ingles, RN since 05/22/2021 12:00 AM  Completed 05/22/2021   Problem: RNCM; Pain Management Plan (Chronic Pain) bilateral legs and left side Resolved 05/22/2021  Priority: High  Long-Range Goal: RNCM: Pain Management Plan Developed Completed 05/22/2021  Start Date: 08/26/2020  Expected End Date: 10/30/2021  Recent Progress: On track  Priority: High  Note:   Current Barriers: Resolving, duplicate goal  Knowledge Deficits related to managing acute/chronic pain Non-adherence to scheduled provider appointments Non-adherence to prescribed medication regimen Difficulty obtaining medications Chronic Disease Management support and education needs related to chronic pain Unable to independently manage pain and  discomfort to bilateral legs and left side  Does not adhere to prescribed medication regimen Does not contact provider office for questions/concerns Nurse Case Manager Clinical Goal(s):  patient will verbalize understanding of plan for managing pain patient will attend all scheduled medical appointments: Encouraged the patient to get a follow up with Dr. Neomia Dear, the patient to call the office  patient will demonstrate use of different relaxation  skills and/or diversional activities to assist with pain reduction (distraction, imagery, relaxation, massage, acupressure, TENS, heat, and cold application patient will report pain at a level less than 3 to 4 on a 10-10 rating scale-01-06-2021: States today is a bad day, rates her pain level at a 9 and worse when she is eating.  patient will use pharmacological and nonpharmacological pain relief strategies patient will verbalize acceptable level of pain relief and ability to engage in desired activities patient will engage in desired activities without an increase in pain level Interventions:  Collaboration with Vigg, Avanti, MD regarding development and update of comprehensive plan of care as evidenced by provider attestation and co-signature Inter-disciplinary care team collaboration (see longitudinal plan of care) - deep breathing, relaxation and mindfulness use promoted - effectiveness of pharmacologic therapy monitored - medication-induced side effects managed - misuse of pain medication assessed - motivation and barriers to change assessed and addressed - mutually acceptable comfort goal set - pain assessed - pain treatment goals reviewed Evaluation of current treatment plan related to pain and patient's adherence to plan as established by provider. 09-09-2020: The  patient called and sent a message through [facility] that she wanted to talk to "Nurse Pam".  Call made to the patient and she was concerned about the new thyroid medications that pcp placed  her on today and the side effects.  The patient states she is frustrated that her pain is not being addressed. Empathetic listening and support given. Advised the patient to talk to the pcp about her concerns and frustrations. Also advised the patient to take the medication for her thyroid and give it an honest try. The patient verbalized understanding. 11-04-2020: The patient has had multiple test since the last outreach. She was supposed to have a CT test yesterday but they would not do it because she has had a reaction to dye in the past and they want the pcp to prescribe prednisone before giving the patient IV dye contrast. The radiology department is supposed to call the pcp for orders. The patient did see OB/GYN and it was determined that the cyst she has on her right ovary is not the cause of her pain. The patient is hopeful that all the testing they are doing will reveal the source of her pain. She is having a good day today. 01-06-2021; The patient is not having a good day today. Rates pain at a 9 on a scale of 0-10. Says CT test shows nothing for the reason she is having pain or discomfort. The patient states that she has been in bed for over a month and only gets up to go to the bathroom  and eat. She does soak in a hot tub about 3 times a night to help with the cramps in her legs. She was going to cancel her provider visit yesterday but her husband made her go. She states he Vitamin D level is low and he has recommended she start taking Vitamin D 5000 units. The patient has some and will start taking. Encouraged the patient to make a follow up with the pcp for evaluation and recommendations.  Advised patient to call the office for changes in level or intensity of pain and discomfort, take medications as directed and work with the CCM team to optimize a plan of care for effective management of pain and discomfort.  Provided education to patient re: taking Ibuprofen as prescribed, discussing her pain  concerns with the pcp. The patient states she has tried to talk to the pcp about her pain but feels the pcp is not listening. She says she is having everything done but the pain being addressed. Reflective listening with education about finding out the root issues of her. 09-09-2020: Review with the patient and the patient does not feel her concerns are being addressed. 11-04-2020: The patient states she is at a much better place today than she has been. She says today is a "good day". Recommended the patient write down questions to ask the pcp at upcoming appointment on 11-10-2020. Will continue to monitor for changes. 01-06-2021: The patient was not having a good day. Is feeling down and out. Empathetic listening and support given. The patient states she doesn't understand why she feels so bad. Explained that her levels being out of range could cause some of the issues. Encouraged her to write down questions to ask the provider and work with the CCM tam to optimize health and well being.  Reviewed medications with patient and discussed the concern of the patient taking Ibuprofen for pain relief and taking 6 pills at one time multiple times a day. The patient states that is the only relief she can get. Has Naproxen but states that does not help her and makes her stomach hurt. Explained the effects of Ibuprofen on the body systems and the need to take as directed. Will discuss with the pcp and pharm D. 11-04-2020: The patient states compliance with her medications regimen at this time. Denies any issues with medications. Is working with pcp and insurance to try and get Ozempic approved for her for weight loss as she is frustrated she can not lose weight. 01-06-2021: Is compliant with medications.  Discussed plans with patient for ongoing care management follow up and provided patient with direct contact information for care management team Allow patient to maintain a diary of pain ratings, timing, precipitating events,  medications, treatments, and what works best to relieve pain,  Refer to support groups and self-help groups Educate patient about the use of pharmacological interventions for pain management- antianxiety, antidepressants, NSAIDS, opioid analgesics,  Explain the importance of lifestyle modifications to effective pain management  Patient Goals/Self Care Activities:  - mutually acceptable comfort goal set - pain assessed - pain management plan developed - pain treatment goals reviewed - patient response to treatment assessed - sharing of pain management plan with teachers and other caregivers encouraged Self-administers medications as prescribed Attends all scheduled provider appointments Calls pharmacy for medication refills Calls provider office for new concerns or questions Follow Up Plan: Telephone follow up appointment with care management team member scheduled for: 03-11-2021 2022 at 1030 am  Care Plan : RNCM; Hypertension (Adult)  Updates made by Vanita Ingles, RN since 05/22/2021 12:00 AM  Completed 05/22/2021   Problem: RNCM: Hypertension (Hypertension) Resolved 05/22/2021  Priority: Medium     Long-Range Goal: RNCM; Hypertension Monitored Completed 05/22/2021  Start Date: 08/26/2020  Expected End Date: 08/26/2021  Recent Progress: On track  Priority: Medium  Note:   Objective: Resolving, duplicate goal Last practice recorded BP readings:  BP Readings from Last 3 Encounters:  01/02/21 130/72  11/10/20 127/84  11/03/20 122/79    Most recent eGFR/CrCl:  Lab Results  Component Value Date   EGFR 51 (L) 09/30/2020    No components found for: CRCL Current Barriers:  Knowledge Deficits related to basic understanding of hypertension pathophysiology and self care management Knowledge Deficits related to understanding of medications prescribed for management of hypertension Non-adherence to prescribed medication regimen Does not adhere to prescribed medication regimen Does  not contact provider office for questions/concerns Case Manager Clinical Goal(s):  patient will verbalize understanding of plan for hypertension management patient will attend all scheduled medical appointments: Encouraged the patient to call and get an appointment with the pcp for follow up patient will demonstrate improved adherence to prescribed treatment plan for hypertension as evidenced by taking all medications as prescribed, monitoring and recording blood pressure as directed, adhering to low sodium/DASH diet patient will demonstrate improved health management independence as evidenced by checking blood pressure as directed and notifying PCP if SBP>160 or DBP > 90, taking all medications as prescribe, and adhering to a low sodium diet as discussed. patient will verbalize basic understanding of hypertension disease process and self health management plan as evidenced by compliance with heart healthy diet, compliance with medications, and working with the CCM team to manage health and well being Interventions:  Collaboration with Vigg, Avanti, MD regarding development and update of comprehensive plan of care as evidenced by provider attestation and co-signature Inter-disciplinary care team collaboration (see longitudinal plan of care) Evaluation of current treatment plan related to hypertension self management and patient's adherence to plan as established by provider. Is having swelling in her feet and legs. Scheduled for stress test tomorrow for evaluation. Education on elevation of legs. She does not like to wear anything on her legs, not even pants touching them. Review of ways to help with edema in legs and feet. 11-04-2020: The patient is doing well with managing her blood pressure. The patient states that she is thankful that finally she is getting some answers. Will continue to monitor for changes. 01-06-2021: The patient is stable with blood pressure, no issues related to HTN health.   Provided education to patient re: stroke prevention, s/s of heart attack and stroke, DASH diet, complications of uncontrolled blood pressure Reviewed medications with patient and discussed importance of compliance. 01-06-2021: The patient is compliant with medications.  Discussed plans with patient for ongoing care management follow up and provided patient with direct contact information for care management team Advised patient, providing education and rationale, to monitor blood pressure daily and record, calling PCP for findings outside established parameters.  Reviewed scheduled/upcoming provider appointments including: Encouraged the patient to call and get an appointment for follow up with pcp Self-Care Activities: - Self administers medications as prescribed Attends all scheduled provider appointments Calls provider office for new concerns, questions, or BP outside discussed parameters Checks BP and records as discussed Follows a low sodium diet/DASH diet Patient Goals: - check blood pressure weekly - choose a place to take my blood  pressure (home, clinic or office, retail store) - write blood pressure results in a log or diary - agree on reward when goals are met - agree to work together to make changes - ask questions to understand - have a family meeting to talk about healthy habits - learn about high blood pressure  - blood pressure trends reviewed - depression screen reviewed - home or ambulatory blood pressure monitoring encouraged Follow Up Plan: Telephone follow up appointment with care management team member scheduled for: 03-11-2021 at 28 am    Care Plan : RNCM: Asthma (Adult)  Updates made by Vanita Ingles, RN since 05/22/2021 12:00 AM  Completed 05/22/2021   Problem: RNCM: Disease Progression (Asthma) Resolved 05/22/2021  Priority: Medium     Long-Range Goal: Disease Progression Prevented or Minimized Completed 05/22/2021  Start Date: 08/26/2020  Expected End Date:  08/26/2021  Recent Progress: On track  Priority: Medium  Note:   Current Barriers: Resolving, duplicate goal  Knowledge deficits related to basic understanding of asthma disease process Knowledge deficits related to basic asthma  self care/management Knowledge deficit related to importance of energy conservation Unable to independently manage asthma exacerbations  Does not maintain contact with provider office Does not contact provider office for questions/concerns  Case Manager Clinical Goal(s): patient will report utilizing pursed lip breathing for shortness of breath patient will verbalize understanding of asthma action plan and when to seek appropriate levels of medical care patient will engage in lite exercise as tolerated to build/regain stamina and strength and reduce shortness of breath through activity tolerance patient will verbalize basic understanding of asthma disease process and self care activities  Interventions:  Collaboration with Vigg, Avanti, MD regarding development and update of comprehensive plan of care as evidenced by provider attestation and co-signature Inter-disciplinary care team collaboration (see longitudinal plan of care) UNABLE to independently: manage asthma during periods of exacerbation  Provided patient with basic written and verbal asthma education on self care/management/and exacerbation prevention. The patient states that she needs a refill for her albuterol as the one she has is outdated. She states she has had asthma for a long time that started in her childhood. She only uses her inhaler when she needs it. Will touch base with pcp and collaborate with patients expressed needs. Explained to the patient that she may need to be evaluated by the pcp before an inhaler could be prescribed. Also advised the patient about smoking causing exacerbation. See care plan for smoking cessation. 11-04-2020: The patient is pacing activity and not going out in the heat of  the day. [place] it was cooler there and nice outside. She denies any exacerbations with her asthma, also working on smoking cessation. Will continue to monitor for changes. 01-06-2021: The patient states that she is breathing good. Denies any new concerns with her asthma.  Provided patient with asthma action plan and reinforced importance of daily self assessment Provided instruction about proper use of medications used for management of asthma including inhalers Advised patient to self assesses asthma  action plan zone and make appointment with provider if in the yellow zone for 48 hours without improvement. Provided patient with education about the role of exercise in the management of asthma  Advised patient to engage in light exercise as tolerated 3-5 days a week Provided education about and advised patient to utilize infection prevention strategies to reduce risk of respiratory infection  Self-Care Activities:  Patient verbalizes understanding of  plan to effective management of asthma  Self administers medications as prescribed Attends all scheduled provider appointments Calls pharmacy for medication refills Attends church or other social activities Performs ADL's independently Performs IADL's independently Calls provider office for new concerns or questions Patient Goals: - do breathing exercises every day - do breathing exercises at least 2 times each day - do exercises in a comfortable position that makes breathing as easy as possible - develop a new routine to improve sleep - don't eat or exercise right before bedtime - eat healthy - get at least 7 to 8 hours of sleep at night - get outdoors every day (weather permitting) - keep room cool and dark - limit daytime naps - practice relaxation or meditation daily - use a fan or white noise in bedroom - use devices that will help like a cane, sock-puller or reacher - begin a symptom  diary - bring symptom diary to all visits - develop a rescue plan - eliminate symptom triggers at home - follow rescue plan if symptoms flare-up - keep follow-up appointments - use an extra pillow to sleep - avoid second hand smoke - eliminate smoking in my home - identify and avoid work-related triggers - identify and remove indoor air pollutants - limit outdoor activity during cold weather - listen for public air quality announcements every day - activity or exercise based on tolerance encouraged - barriers to medication adherence identified - emotional support provided - medication-adherence assessment completed - screen for functional limitations reviewed - side effects of medication monitored - self-awareness of symptom triggers encouraged - symptom control monitored - symptom log or asthma action plan reviewed - symptom triggers identified Follow Up Plan: Telephone follow up appointment with care management team member scheduled for: 03-11-2021 at 2 am    Problem: RNCM: Smoking cessation Resolved 05/22/2021  Priority: High     Long-Range Goal: RNCM: Smoking Cessation Completed 05/22/2021  Start Date: 08/26/2020  Expected End Date: 08/26/2021  Recent Progress: On track  Priority: High  Note:   Current Barriers: Resolving, duplicate goal  Unable to independently stop smoking.  Lacks social connections Does not contact provider office for questions/concerns Tobacco abuse of >30 years; currently smoking 1 ppd Previous quit attempts, unsuccessful several successful using 3 times while she was pregnant and quit because she did not want to harm the baby  Reports smoking within 30 minutes of waking up Reports triggers to smoke include: anxiety, not having the answers to her health problems Reports motivation to quit smoking includes: knows it would be better for her health and well being  On a scale of 1-10, reports MOTIVATION to quit is 7 On a scale of 1-10, reports  CONFIDENCE in quitting is 7 Clinical Goal(s):  patient will work with Chief Operating Officer and provider towards tobacco cessation  Interventions: Collaboration with Vigg, Avanti, MD regarding development and update of comprehensive plan of care as evidenced by provider attestation and co-signature Inter-disciplinary care team collaboration (see longitudinal plan of care) Evaluation of current treatment plan reviewed- the patient wants to quit but states she smokes more when her anxiety level is up. Is interested in medications to help with quitting but states she can not afford. Pharm D referral. 11-04-2020: The patient is diligently working on cutting back on her smoking. She is doing better at managing her anxiety and states she is wanting to change her habits so she can feel better and be there for her family. 01-06-2021:  The patient states she is smoking about one cigarette a day. She wants to quit but has not fully quit yet. Education and support.  Provided contact information for [place] [facility] (1-800-QUIT-NOW). Patient will outreach this group for support. Discussed plans with patient for ongoing care management follow up and provided patient with direct contact information for care management team Provided patient with printed smoking cessation educational materials Reviewed scheduled/upcoming provider appointments including: The patient needs a follow up with the pcp Referred patient to pharmacy team Provided contact information for [place] [facility]uit [facility] (1-800-QUIT-NOW). Patient will outreach this group for support. Evaluation of current treatment plan reviewed. 01-06-2021: See provider regularly. Actively working on cutting back with goal of quitting  Patient Goals/Self-Care Activities - diversion activities as a habit during cravings  - verbally commit to reducing tobacco consumption - adherence to treatment plan encouraged - barriers to adherence to treatment  plan identified - lifestyle changes encouraged - psychosocial concerns monitored - symptoms of respiratory distress monitored  Follow Up Plan: Telephone follow up appointment with care management team member scheduled for: 03-11-2021 at 55 am    Care Plan : RNCM: Depression (Adult) and Anxiety  Updates made by Vanita Ingles, RN since 05/22/2021 12:00 AM  Completed 05/22/2021   Problem: RNCM: Symptoms (Depression) and Anxiety Resolved 05/22/2021  Priority: High  Onset Date: 08/26/2020     Long-Range Goal: RNCM; Symptoms Monitored and Managed: Depression and Anxiety Completed 05/22/2021  Start Date: 08/26/2020  Expected End Date: 08/26/2021  Recent Progress: Not on track  Priority: High  Note:   Current Barriers: Resolving, duplicate goal Ineffective Self Health Maintenance in a patient with Anxiety and Depression Unable to independently depression and anxiety  Has had a lot of losses in her life with death and grief: father, step-mother, sister (suicide), grandfather, good friend, other family members Clinical Goal(s):  Collaboration with Vigg, Avanti, MD regarding development and update of comprehensive plan of care as evidenced by provider attestation and co-signature Inter-disciplinary care team collaboration (see longitudinal plan of care) patient will work with care management team to address care coordination and chronic disease management needs related to Psychosocial Support Mental Health Counseling Level of Care Concerns   Interventions:  Evaluation of current treatment plan related to Anxiety and Depression, Level of care concerns, Mental Health Concerns , and grief and loss  self-management and patient's adherence to plan as established by provider. 01-06-2021: The patient states that she is not at a good place today. She is down and triggers are present. She discussed missing her dad and even though it has been 3 years she was thinking of him. The patient talked about how she had  been on Zoloft before but she had SI. She denies any SI but does not want to take anything that may trigger that as there is a family history of suicide with her own sister committing suicide 3 years ago after her father passed. She states several cousins and other family members have committed suicide. The patient is receptive to talking to the pcp about something for depression. She says the pcp has offered this before. Ask her to consider this because there are several options other than zoloft. Encouraged the patient to journal her feelings, work with LCSW, do relaxation, mindfulness, and other things that may work for effective dealing with depression and anxiety.  Collaboration with Charlynne Cousins, MD regarding development and update of comprehensive plan of care as evidenced by provider attestation  and co-signature Inter-disciplinary care team collaboration (see longitudinal plan of care) Discussed plans with patient for ongoing care management follow up and provided patient with direct contact information for care management team Self Care Activities:  Patient verbalizes understanding of plan to effectively manage depression and anxiety due to grief and multiple chronic conditions  Self administers medications as prescribed Attends all scheduled provider appointments Calls pharmacy for medication refills Attends church or other social activities Performs ADL's independently Performs IADL's independently Calls provider office for new concerns or questions Patient Goals: - activity or exercise based on tolerance encouraged - depression screen reviewed - emotional support provided - healthy lifestyle promoted - medication side effects monitored and managed - pain managed - participation in mental health treatment encouraged - quality of sleep assessed - response to mental health treatment monitored - response to pharmacologic therapy monitored - sleep hygiene techniques  encouraged - social activities and relationships encouraged - substance use assessed - work or school reintegration facilitated Follow Up Plan: Telephone follow up appointment with care management team member scheduled for: 03-11-2021 at 72 am    Care Plan : RNCM: General Plan of Care (Adult) for Chronic Disease Management and Care Coordination Needs  Updates made by Vanita Ingles, RN since 05/22/2021 12:00 AM     Problem: RNCM: Development of plan of care for Chronic Disease Management (HTN, Hypothyroidism, Asthma, Depression, Anxiety, Chronic pain, Smoker)   Priority: High     Long-Range Goal: RNCM: Effective Management of plan of care for Chronic Disease Management (HTN, Hypothyroidism, Asthma, Depression, Anxiety, Chronic pain, Smoker)   Start Date: 05/22/2021  Expected End Date: 05/22/2022  Priority: High  Note:   Current Barriers:  Knowledge Deficits related to plan of care for management of HTN, Chronic Pain, Pulmonary Disease, and Depression, anxiety, smoker, and hypothyroidism  Care Coordination needs related to Mental Health Concerns   Chronic Disease Management support and education needs related to HTN, Chronic Pain, Pulmonary Disease, smoker, and hypothyroidism, depression and anxiety  RNCM Clinical Goal(s):  Patient will verbalize basic understanding of HTN, Anxiety, Depression, Pulmonary Disease, Hypothyroidism, smoker,  and Chronic pain  disease process and self health management plan as evidenced by keeping appointments, following the plan of care, following dietary restrictions, adequate rest, and working with the CCM team to effectively manage health and well being. take all medications exactly as prescribed and will call provider for medication related questions as evidenced by compliance with medications and calling for refills before running out. The patient is having issues with her insurance as she switched insurances and her medications were going to cost >600.00. The  patient has called and went back to her old insurance but it will not be in effect until June 17, 2021. The patient is going to pick up medications today all but the Spiriva and she has some of that on hand.     attend all scheduled medical appointments: 06-19-2021 at 10 am as evidenced by keeping appointments and calling for schedule change needs         demonstrate improved and ongoing adherence to prescribed treatment plan for HTN, Anxiety, Depression, Pulmonary Disease, Hypothyroidism, smoker, and chronic pain  as evidenced by calling the office for questions and concerns, keeping appointments, no exacerbation of conditions, stable VS, routine labwork and following established plan of care.  continue to work with Consulting civil engineer and/or Social Worker to address care management and care coordination needs related to HTN, Anxiety, Depression, Pulmonary Disease, Hypothyroidism, Tobacco Use,  and Chronic pain  as evidenced by adherence to CM Team Scheduled appointments     work with Education officer, museum to address [place] Concerns  related to the management of HTN, Anxiety, Depression, Pulmonary Disease, Hypothyroidism, Tobacco Use, and chronic pain  as evidenced by review of EMR and patient or social worker report     demonstrate ongoing self health care management ability for effective management of chronic conditions as evidenced by working with the CCM team through collaboration with Consulting civil engineer, provider, and care team.   Interventions: 1:1 collaboration with primary care provider regarding development and update of comprehensive plan of care as evidenced by provider attestation and co-signature Inter-disciplinary care team collaboration (see longitudinal plan of care) Evaluation of current treatment plan related to  self management and patient's adherence to plan as established by provider   Asthma: (Status: Goal on Track (progressing): YES.) Long Term Goal  Provided patient with basic written and  verbal Asthma education on self care/management/and exacerbation prevention; Advised patient to track and manage Asthma triggers. 05-22-2021: The patient is aware of triggers that cause exacerbation. Denies any issues with her asthma at this time;  Provided written and verbal instructions on pursed lip breathing and utilized returned demonstration as teach back; Provided instruction about proper use of medications used for management of Asthma including inhalers; Advised patient to self assesses Asthma action plan zone and make appointment with provider if in the yellow zone for 48 hours without improvement; Advised patient to engage in light exercise as tolerated 3-5 days a week to aid in the the management of Asthma; Provided education about and advised patient to utilize infection prevention strategies to reduce risk of respiratory infection; Discussed the importance of adequate rest and management of fatigue with Asthma; Screening for signs and symptoms of depression related to chronic disease state;  Assessed social determinant of health barriers;    Hypothyroidism  (Status: Goal on Track (progressing): YES.) Long Term Goal  Evaluation of current treatment plan related to Hypothyroidism, Mental Health Concerns  self-management and patient's adherence to plan as established by provider. Discussed plans with patient for ongoing care management follow up and provided patient with direct contact information for care management team Advised patient to call the office for changes in condition, new concerns or questions related to hypothyroidism; Provided education to patient re: importance of taking medications as prescribed. The patient was having issues with getting medications due to changes in insurances but she is switching back and the patient should be able to get her synthroid; Reviewed medications with patient and discussed compliance and taking as directed. 30 minutes to an hour before eating  and not taking with other medications. ; Provided patient with medications, sx and sx to look for with Hypothyroidism educational materials related to effective management of hypothyroidism; Reviewed scheduled/upcoming provider appointments including 06-19-2021 at 10 am; Discussed plans with patient for ongoing care management follow up and provided patient with direct contact information for care management team; Advised patient to discuss lab work and other questions and concerns related to hypothyroidism with provider;   Depression and Anxiety  (Status: Goal on Track (progressing): YES.) Long Term Goal  Evaluation of current treatment plan related to Anxiety and Depression, Mental Health Concerns  self-management and patient's adherence to plan as established by provider. 05-22-2021: The patient is doing well and was excited to tell the [facility] about her new adventure. 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  She states that this is giving her purpose in her life and now that she looks back at it she was having the "empty nest syndrome". The 3 oldest children will go back with their mother when she finds stable housing but her and her husband are considering adopting the 38 and 55 year old because their parents are in prison. The patient states this is helping her so much and she is so glad she did it.  Discussed plans with patient for ongoing care management follow up and provided patient with direct contact information for care management team Advised patient to call the office for changes in mood, anxiety, depression, or any questions or concerns with mental health conditions; Provided education to patient re: working with the CCM team to optimize mental health well being. The patient is happy and doing well today. She has new purpose in her life and is happy to be fostering 5 children; Reviewed medications with patient and discussed compliance. The patient states compliance with  medications; Reviewed scheduled/upcoming provider appointments including 06-19-2021 at 10 am; Social Work referral for ongoing support and education; Discussed plans with patient for ongoing care management follow up and provided patient with direct contact information for care management team; Advised patient to discuss new concerns or questions about mental health with provider;  Hypertension: (Status: Goal on Track (progressing): YES.) Long Term Goal  Last practice recorded BP readings:  BP Readings from Last 3 Encounters:  02/20/21 98/62  01/23/21 127/63  01/02/21 130/72  Most recent eGFR/CrCl:  Lab Results  Component Value Date   EGFR 57 (L) 01/23/2021    No components found for: CRCL  Evaluation of current treatment plan related to hypertension self management and patient's adherence to plan as established by provider;   Provided education to patient re: stroke prevention, s/s of heart attack and stroke; Reviewed prescribed diet heart healthy diet  Reviewed medications with patient and discussed importance of compliance;  Discussed plans with patient for ongoing care management follow up and provided patient with direct contact information for care management team; Advised patient, providing education and rationale, to monitor blood pressure daily and record, calling PCP for findings outside established parameters;  Advised patient to discuss blood pressure trends  with provider; Provided education on prescribed diet heart healthy diet ;  Discussed complications of poorly controlled blood pressure such as heart disease, stroke, circulatory complications, vision complications, kidney impairment, sexual dysfunction;   Pain:  (Status: Goal on Track (progressing): YES.) Long Term Goal  Pain assessment performed. Rates her buttocks pain today at a 9. She fell before Christmas and she feels like she may have fractured her tail bone but she states there is nothing they can do for that. The  patient has generalized joint pain and discomfort and rates this at a 4 today. She states this is tolerable Medications reviewed. 05-22-2021: Is compliant with medications  Reviewed provider established plan for pain management; Discussed importance of adherence to all scheduled medical appointments; Counseled on the importance of reporting any/all new or changed pain symptoms or management strategies to pain management provider; Advised patient to report to care team affect of pain on daily activities; Discussed use of relaxation techniques and/or diversional activities to assist with pain reduction (distraction, imagery, relaxation, massage, acupressure, TENS, heat, and cold application; Reviewed with patient prescribed pharmacological and nonpharmacological pain relief strategies; Advised patient to discuss unresolved pain, changes in level or intensity of pain with provider;   Smoking Cessation: (Status: Goal on  Track (progressing): YES.) Long Term Goal  Reviewed smoking history:  tobacco abuse of >30 years; currently smoking 1 ppd Previous quit attempts, unsuccessful several successful using 3 times when she was pregnant with her 3 children  Reports smoking within 30 minutes of waking up Reports triggers to smoke include: stress Reports motivation to quit smoking includes: has new purpose in her life with being a foster parent to 5 minors On a scale of 1-10, reports MOTIVATION to quit is 8 On a scale of 1-10, reports CONFIDENCE in quitting is 8  Evaluation of current treatment plan reviewed; Advised patient to discuss smoking cessation options with provider; Provided contact information for [place] [facility]uit [facility] (1-800-QUIT-NOW); Provided patient with printed smoking cessation educational materials; Reviewed scheduled/upcoming provider appointments including: 06-19-2021 at 10 am; Discussed plans with patient for ongoing care management follow up and provided patient with direct contact information  for care management team; Advised patient to discuss smoking cessation options, new concerns or questions with respiratory health with provider;  Patient Goals/Self-Care Activities: Take medications as prescribed   Attend all scheduled provider appointments Call pharmacy for medication refills 3-7 days in advance of running out of medications Attend church or other social activities Perform all self care activities independently  Perform IADL's (shopping, preparing meals, housekeeping, managing finances) independently Call provider office for new concerns or questions  Work with the social worker to address care coordination needs and will continue to work with the clinical team to address health care and disease management related needs call the Suicide and Crisis Lifeline: 988 call the [place] National Suicide Prevention Lifeline: 908-495-3409 or TTY: 458-840-0249 TTY (346)456-6690) to talk to a trained counselor call 1-800-273-TALK (toll free, 24 hour hotline) if experiencing a Mental Health or [place]  - avoid second hand smoke - eliminate smoking in my home - identify and avoid work-related triggers - identify and remove indoor air pollutants - limit outdoor activity during cold weather - listen for public air quality announcements every day - do breathing exercises every day - develop a rescue plan - eliminate symptom triggers at home - follow rescue plan if symptoms flare-up - keep follow-up appointments: 06-19-2021 at 10 am - use an extra pillow to sleep - develop a new routine to improve sleep - don't eat or exercise right before bedtime - eat healthy/prescribed diet: heart healthy/ADA diet (pre-DM) - get at least 7 to 8 hours of sleep at night - use devices that will help like a cane, sock-puller or reacher - practice relaxation or meditation daily - do exercises in a comfortable position that makes breathing as easy as possible check blood pressure 3 times  per week choose a place to take my blood pressure (home, clinic or office, retail store) write blood pressure results in a log or diary learn about high blood pressure keep a blood pressure log take blood pressure log to all doctor appointments call doctor for signs and symptoms of high blood pressure develop an action plan for high blood pressure keep all doctor appointments take medications for blood pressure exactly as prescribed report new symptoms to your doctor eat more whole grains, fruits and vegetables, lean meats and healthy fats       Plan: Telephone follow up appointment with care management team member scheduled for:  07-10-2021 at 230 pm  [place], MSN, [facility] Mobile: [phone]

## 2021-06-05 ENCOUNTER — Encounter (HOSPITAL_COMMUNITY): Payer: Self-pay | Admitting: Radiology

## 2021-06-17 ENCOUNTER — Other Ambulatory Visit: Payer: Self-pay | Admitting: Internal Medicine

## 2021-06-17 NOTE — Telephone Encounter (Signed)
Requested medications are due for refill today.  yes ? ?Requested medications are on the active medications list.  yes ? ?Last refill. 02/20/2021 #30  3 refills ? ?Future visit scheduled.   Yes - 2 days ? ?Notes to clinic.  Failed protocol d/t abnormal labs. ? ? ? ?Requested Prescriptions  ?Pending Prescriptions Disp Refills  ? levothyroxine (SYNTHROID) 75 MCG tablet [Pharmacy Med Name: Levothyroxine Sodium 75 MCG Oral Tablet] 30 tablet 0  ?  Sig: TAKE 1 TABLET BY MOUTH ONCE DAILY BEFORE  BREAKFAST  ?  ? Endocrinology:  Hypothyroid Agents Failed - 06/17/2021  7:27 AM  ?  ?  Failed - TSH in normal range and within 360 days  ?  TSH  ?Date Value Ref Range Status  ?01/23/2021 6.440 (H) 0.450 - 4.500 uIU/mL Final  ?  ?  ?  ?  Passed - Valid encounter within last 12 months  ?  Recent Outpatient Visits   ? ?      ? 3 months ago Prediabetes  ? [facility] Vigg, Avanti, MD  ? 4 months ago Need for influenza vaccination  ? [place], MD  ? 7 months ago LUQ pain  ? [facility] Vigg, Avanti, MD  ? 8 months ago Morbid obesity ([place])  ? [facility] Vigg, Avanti, MD  ? 8 months ago Asthma case management patient  ? [facility] Vigg, Avanti, MD  ? ?  ?  ?Future Appointments   ? ?        ? In 2 days Vigg, Avanti, MD [facility]  ? ?  ? ?  ?  ?  ?  ?

## 2021-06-19 ENCOUNTER — Ambulatory Visit: Payer: 59 | Admitting: Internal Medicine

## 2021-06-25 ENCOUNTER — Other Ambulatory Visit: Payer: Self-pay

## 2021-06-25 ENCOUNTER — Encounter: Payer: Self-pay | Admitting: Internal Medicine

## 2021-06-25 ENCOUNTER — Ambulatory Visit (INDEPENDENT_AMBULATORY_CARE_PROVIDER_SITE_OTHER): Payer: 59 | Admitting: Internal Medicine

## 2021-06-25 VITALS — BP 132/77 | HR 81 | Temp 98.0°F | Ht 63.11 in | Wt 226.6 lb

## 2021-06-25 DIAGNOSIS — E039 Hypothyroidism, unspecified: Secondary | ICD-10-CM | POA: Diagnosis not present

## 2021-06-25 DIAGNOSIS — M79671 Pain in right foot: Secondary | ICD-10-CM | POA: Diagnosis not present

## 2021-06-25 DIAGNOSIS — M79672 Pain in left foot: Secondary | ICD-10-CM

## 2021-06-25 DIAGNOSIS — I10 Essential (primary) hypertension: Secondary | ICD-10-CM | POA: Diagnosis not present

## 2021-06-25 DIAGNOSIS — F17200 Nicotine dependence, unspecified, uncomplicated: Secondary | ICD-10-CM | POA: Diagnosis not present

## 2021-06-25 LAB — URINALYSIS, ROUTINE W REFLEX MICROSCOPIC
Bilirubin, UA: NEGATIVE
Glucose, UA: NEGATIVE
Ketones, UA: NEGATIVE
Leukocytes,UA: NEGATIVE
Nitrite, UA: NEGATIVE
Protein,UA: NEGATIVE
RBC, UA: NEGATIVE
Specific Gravity, UA: 1.02 (ref 1.005–1.030)
Urobilinogen, Ur: 0.2 mg/dL (ref 0.2–1.0)
pH, UA: 6 (ref 5.0–7.5)

## 2021-06-25 MED ORDER — NICOTINE 21 MG/24HR TD PT24: 21.0000 mg | MEDICATED_PATCH | Freq: Every day | TRANSDERMAL | 0 refills | Status: DC

## 2021-06-25 MED ORDER — PRAMIPEXOLE DIHYDROCHLORIDE 0.25 MG PO TABS
0.2500 mg | ORAL_TABLET | Freq: Three times a day (TID) | ORAL | 1 refills | Status: DC
Start: 2021-06-25 — End: 2021-09-18

## 2021-06-25 NOTE — Progress Notes (Signed)
BP 132/77    Pulse 81    Temp 98 F (36.7 C) (Oral)    Ht 5' 3.11" (1.603 m)    Wt 226 lb 9.6 oz (102.8 kg)    LMP  (LMP Unknown)    SpO2 100%    BMI 40.00 kg/m    Subjective:    Patient ID: Jodi Keith, female    DOB: [birth year], 56 y.o.   MRN: [id number]  Chief Complaint  Patient presents with   Hypothyroidism   Back Pain   RLS   Prediabetes   Foot Pain    B/L foot pain about 45 mins after getting up, they feel like they are breaking    HPI: Jodi Keith is a 56 y.o. female  Has 5 Foster kids since November 2022.  Back Pain This is a chronic problem. The current episode started more than 1 year ago. The problem occurs intermittently. Pertinent negatives include no abdominal pain, chest pain, dysuria, fever, headaches, numbness or weakness.  Foot Pain This is a new (x 1 week has bil foot pain) problem. The current episode started in the past 7 days. The problem occurs intermittently. The problem has been gradually worsening. Pertinent negatives include no abdominal pain, anorexia, arthralgias, change in bowel habit, chest pain, chills, congestion, coughing, fatigue, fever, headaches, joint swelling, myalgias, nausea, neck pain, numbness, rash, sore throat, swollen glands, urinary symptoms, vertigo, visual change, vomiting or weakness.  Thyroid Problem Presents for follow-up (tsh high last time is on synthorid for such) visit. Patient reports no anxiety, cold intolerance, constipation, diarrhea, fatigue, heat intolerance, hoarse voice, leg swelling, palpitations, tremors or visual change.  Nicotine Dependence Presents for follow-up (down to 1/2 ppd of cigs now was on patches in the past helped per pt.) visit. Symptoms are negative for decreased concentration, fatigue and sore throat. Her urge triggers include company of smokers.   Chief Complaint  Patient presents with   Hypothyroidism   Back Pain   RLS   Prediabetes   Foot Pain    B/L foot pain about 45 mins  after getting up, they feel like they are breaking    Relevant past medical, surgical, family and social history reviewed and updated as indicated. Interim medical history since our last visit reviewed. Allergies and medications reviewed and updated.  Review of Systems  Constitutional:  Negative for activity change, appetite change, chills, fatigue and fever.  HENT:  Negative for congestion, ear discharge, ear pain, facial swelling, hoarse voice and sore throat.   Eyes:  Negative for pain and itching.  Respiratory:  Negative for cough, chest tightness, shortness of breath and wheezing.   Cardiovascular:  Negative for chest pain, palpitations and leg swelling.  Gastrointestinal:  Negative for abdominal distention, abdominal pain, anorexia, blood in stool, change in bowel habit, constipation, diarrhea, nausea and vomiting.  Endocrine: Negative for cold intolerance, heat intolerance, polydipsia, polyphagia and polyuria.  Genitourinary:  Negative for difficulty urinating, dysuria, flank pain, frequency, hematuria and urgency.  Musculoskeletal:  Positive for back pain. Negative for arthralgias, gait problem, joint swelling, myalgias, neck pain and neck stiffness.  Skin:  Negative for color change, rash and wound.  Neurological:  Negative for dizziness, vertigo, tremors, speech difficulty, weakness, light-headedness, numbness and headaches.  Hematological:  Does not bruise/bleed easily.  Psychiatric/Behavioral:  Negative for agitation, confusion, decreased concentration, sleep disturbance and suicidal ideas. The patient is not nervous/anxious.    Per HPI unless specifically indicated above     Objective:  BP 132/77    Pulse 81    Temp 98 F (36.7 C) (Oral)    Ht 5' 3.11" (1.603 m)    Wt 226 lb 9.6 oz (102.8 kg)    LMP  (LMP Unknown)    SpO2 100%    BMI 40.00 kg/m   Wt Readings from Last 3 Encounters:  06/25/21 226 lb 9.6 oz (102.8 kg)  02/20/21 231 lb 9.6 oz (105.1 kg)  01/23/21 237 lb  (107.5 kg)    Physical Exam Vitals and nursing note reviewed.  Constitutional:      General: She is not in acute distress.    Appearance: Normal appearance. She is not ill-appearing or diaphoretic.  Eyes:     Conjunctiva/sclera: Conjunctivae normal.  Pulmonary:     Breath sounds: No rhonchi.  Abdominal:     General: Abdomen is flat. Bowel sounds are normal. There is no distension.     Palpations: Abdomen is soft. There is no mass.     Tenderness: There is no abdominal tenderness. There is no guarding.  Skin:    General: Skin is warm and dry.     Coloration: Skin is not jaundiced.     Findings: No erythema.  Neurological:     Mental Status: She is alert.    Results for orders placed or performed in visit on 06/25/21  CBC with Differential/Platelet  Result Value Ref Range   WBC 8.6 3.4 - 10.8 x10E3/uL   RBC 5.05 3.77 - 5.28 x10E6/uL   Hemoglobin 15.2 11.1 - 15.9 g/dL   Hematocrit 45.3 34.0 - 46.6 %   MCV 90 79 - 97 fL   MCH 30.1 26.6 - 33.0 pg   MCHC 33.6 31.5 - 35.7 g/dL   RDW 14.1 11.7 - 15.4 %   Platelets 305 150 - 450 x10E3/uL   Neutrophils 59 Not Estab. %   Lymphs 31 Not Estab. %   Monocytes 5 Not Estab. %   Eos 3 Not Estab. %   Basos 1 Not Estab. %   Neutrophils Absolute 5.2 1.4 - 7.0 x10E3/uL   Lymphocytes Absolute 2.7 0.7 - 3.1 x10E3/uL   Monocytes Absolute 0.4 0.1 - 0.9 x10E3/uL   EOS (ABSOLUTE) 0.2 0.0 - 0.4 x10E3/uL   Basophils Absolute 0.1 0.0 - 0.2 x10E3/uL   Immature Granulocytes 1 Not Estab. %   Immature Grans (Abs) 0.1 0.0 - 0.1 x10E3/uL  Comprehensive metabolic panel  Result Value Ref Range   Glucose 87 70 - 99 mg/dL   BUN 12 6 - 24 mg/dL   Creatinine, Ser 1.09 (H) 0.57 - 1.00 mg/dL   eGFR 60 >59 mL/min/1.73   BUN/Creatinine Ratio 11 9 - 23   Sodium 141 134 - 144 mmol/L   Potassium 4.6 3.5 - 5.2 mmol/L   Chloride 101 96 - 106 mmol/L   CO2 20 20 - 29 mmol/L   Calcium 9.9 8.7 - 10.2 mg/dL   Total Protein 7.3 6.0 - 8.5 g/dL   Albumin 4.3 3.8 -  4.9 g/dL   Globulin, Total 3.0 1.5 - 4.5 g/dL   Albumin/Globulin Ratio 1.4 1.2 - 2.2   Bilirubin Total <0.2 0.0 - 1.2 mg/dL   Alkaline Phosphatase 145 (H) 44 - 121 IU/L   AST 15 0 - 40 IU/L   ALT 23 0 - 32 IU/L  TSH  Result Value Ref Range   TSH WILL FOLLOW   Urinalysis, Routine w reflex microscopic  Result Value Ref Range   Specific Gravity, UA  1.020 1.005 - 1.030   pH, UA 6.0 5.0 - 7.5   Color, UA Yellow Yellow   Appearance Ur Clear Clear   Leukocytes,UA Negative Negative   Protein,UA Negative Negative/Trace   Glucose, UA Negative Negative   Ketones, UA Negative Negative   RBC, UA Negative Negative   Bilirubin, UA Negative Negative   Urobilinogen, Ur 0.2 0.2 - 1.0 mg/dL   Nitrite, UA Negative Negative  T4, free  Result Value Ref Range   Free T4 WILL FOLLOW         Current Outpatient Medications:    albuterol (VENTOLIN HFA) 108 (90 Base) MCG/ACT inhaler, INHALE 2 PUFFS BY MOUTH EVERY 6 HOURS AS NEEDED FOR WHEEZING FOR SHORTNESS OF BREATH, Disp: 7 each, Rfl: 0   albuterol (VENTOLIN HFA) 108 (90 Base) MCG/ACT inhaler, Inhale 1 puff into the lungs every 6 (six) hours as needed for wheezing or shortness of breath., Disp: 16 g, Rfl: 6   amLODipine (NORVASC) 10 MG tablet, Take 1 tablet (10 mg total) by mouth daily., Disp: 30 tablet, Rfl: 11   atorvastatin (LIPITOR) 20 MG tablet, Take 1 tablet by mouth once daily, Disp: 30 tablet, Rfl: 0   buPROPion (WELLBUTRIN XL) 150 MG 24 hr tablet, Take 1 tablet (150 mg total) by mouth daily., Disp: 30 tablet, Rfl: 5   famotidine (PEPCID) 20 MG tablet, Take 1 tablet (20 mg total) by mouth at bedtime., Disp: 30 tablet, Rfl: 3   Homeopathic Products (RESTFUL LEGS SL), Place 1 tablet under the tongue at bedtime., Disp: , Rfl:    levothyroxine (SYNTHROID) 75 MCG tablet, TAKE 1 TABLET BY MOUTH ONCE DAILY BEFORE  BREAKFAST, Disp: 30 tablet, Rfl: 0   nicotine (NICODERM CQ) 21 mg/24hr patch, Place 1 patch (21 mg total) onto the skin daily., Disp: 28  patch, Rfl: 0   nystatin cream (MYCOSTATIN), Apply topically 2 (two) times daily., Disp: 30 g, Rfl: 0   SPIRIVA RESPIMAT 2.5 MCG/ACT AERS, INHALE 2 SPRAY(S) BY MOUTH ONCE DAILY, Disp: 4 g, Rfl: 0   VITAMIN D, ERGOCALCIFEROL, PO, Take 5,000 Int'l Units/day by mouth daily., Disp: , Rfl:    pramipexole (MIRAPEX) 0.25 MG tablet, Take 1 tablet (0.25 mg total) by mouth 3 (three) times daily., Disp: 90 tablet, Rfl: 1    Assessment & Plan:  Tobacco abuse:  Smoking cessation advised. Restart  nicotine patches helped in the past.  continues to smoke. Has youger kids at home through foster care more than > 5 - 10 mins of time was spent with pt regarding smoking cessation and complications.   HTN :  Continue current meds.  Medication compliance emphasised. pt advised to keep Bp logs. Pt verbalised understanding of the same. Pt to have a low salt diet . Exercise to reach a goal of at least 150 mins a week.  lifestyle modifications explained and pt understands importance of the above. Under good control on current regimen. Continue current regimen. Continue to monitor. Call with any concerns. Refills given. Labs drawn today.   HYPOTHYROIDISM:  Chronic, ongoing with recent TSH within normal range.  Continue current medication regimen and adjust as needed.  Plan to recheck TSH at physical in 6 months.   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  Will check xrays of foot  Problem List Items Addressed This Visit       Cardiovascular and Mediastinum   Primary hypertension     Endocrine   Hypothyroidism - Primary   Relevant Orders   CBC with Differential/Platelet (Completed)  Comprehensive metabolic panel (Completed)   TSH (Completed)   Urinalysis, Routine w reflex microscopic (Completed)   T4, free (Completed)     Other   Smoking addiction   Pain in both feet   Relevant Orders   DG Foot Complete Left   DG Foot Complete Right     Orders Placed This Encounter  Procedures   DG Foot Complete Left   DG Foot  Complete Right   CBC with Differential/Platelet   Comprehensive metabolic panel   TSH   Urinalysis, Routine w reflex microscopic   T4, free     Meds ordered this encounter  Medications   pramipexole (MIRAPEX) 0.25 MG tablet    Sig: Take 1 tablet (0.25 mg total) by mouth 3 (three) times daily.    Dispense:  90 tablet    Refill:  1   nicotine (NICODERM CQ) 21 mg/24hr patch    Sig: Place 1 patch (21 mg total) onto the skin daily.    Dispense:  28 patch    Refill:  0     Follow up plan: Return in about 3 months (around 09/25/2021).

## 2021-06-26 LAB — CBC WITH DIFFERENTIAL/PLATELET
Basophils Absolute: 0.1 10*3/uL (ref 0.0–0.2)
Basos: 1 %
EOS (ABSOLUTE): 0.2 10*3/uL (ref 0.0–0.4)
Eos: 3 %
Hematocrit: 45.3 % (ref 34.0–46.6)
Hemoglobin: 15.2 g/dL (ref 11.1–15.9)
Immature Grans (Abs): 0.1 10*3/uL (ref 0.0–0.1)
Immature Granulocytes: 1 %
Lymphocytes Absolute: 2.7 10*3/uL (ref 0.7–3.1)
Lymphs: 31 %
MCH: 30.1 pg (ref 26.6–33.0)
MCHC: 33.6 g/dL (ref 31.5–35.7)
MCV: 90 fL (ref 79–97)
Monocytes Absolute: 0.4 10*3/uL (ref 0.1–0.9)
Monocytes: 5 %
Neutrophils Absolute: 5.2 10*3/uL (ref 1.4–7.0)
Neutrophils: 59 %
Platelets: 305 10*3/uL (ref 150–450)
RBC: 5.05 x10E6/uL (ref 3.77–5.28)
RDW: 14.1 % (ref 11.7–15.4)
WBC: 8.6 10*3/uL (ref 3.4–10.8)

## 2021-06-26 LAB — COMPREHENSIVE METABOLIC PANEL
ALT: 23 IU/L (ref 0–32)
AST: 15 IU/L (ref 0–40)
Albumin/Globulin Ratio: 1.4 (ref 1.2–2.2)
Albumin: 4.3 g/dL (ref 3.8–4.9)
Alkaline Phosphatase: 145 IU/L — ABNORMAL HIGH (ref 44–121)
BUN/Creatinine Ratio: 11 (ref 9–23)
BUN: 12 mg/dL (ref 6–24)
Bilirubin Total: <0.2 mg/dL (ref 0.0–1.2)
CO2: 20 mmol/L (ref 20–29)
Calcium: 9.9 mg/dL (ref 8.7–10.2)
Chloride: 101 mmol/L (ref 96–106)
Creatinine, Ser: 1.09 mg/dL — ABNORMAL HIGH (ref 0.57–1.00)
Globulin, Total: 3 g/dL (ref 1.5–4.5)
Glucose: 87 mg/dL (ref 70–99)
Potassium: 4.6 mmol/L (ref 3.5–5.2)
Sodium: 141 mmol/L (ref 134–144)
Total Protein: 7.3 g/dL (ref 6.0–8.5)
eGFR: 60 mL/min/{1.73_m2} (ref >59)

## 2021-06-26 LAB — T4, FREE: Free T4: 1.24 ng/dL (ref 0.82–1.77)

## 2021-06-26 LAB — TSH: TSH: 2.87 u[IU]/mL (ref 0.450–4.500)

## 2021-07-03 ENCOUNTER — Ambulatory Visit: Payer: 59 | Admitting: Endocrinology

## 2021-07-10 ENCOUNTER — Telehealth: Payer: BLUE CROSS/BLUE SHIELD

## 2021-07-10 ENCOUNTER — Ambulatory Visit: Payer: Self-pay

## 2021-07-10 DIAGNOSIS — I10 Essential (primary) hypertension: Secondary | ICD-10-CM

## 2021-07-10 DIAGNOSIS — F419 Anxiety disorder, unspecified: Secondary | ICD-10-CM

## 2021-07-10 DIAGNOSIS — G8929 Other chronic pain: Secondary | ICD-10-CM

## 2021-07-10 DIAGNOSIS — J45901 Unspecified asthma with (acute) exacerbation: Secondary | ICD-10-CM

## 2021-07-10 DIAGNOSIS — M79671 Pain in right foot: Secondary | ICD-10-CM

## 2021-07-10 DIAGNOSIS — F339 Major depressive disorder, recurrent, unspecified: Secondary | ICD-10-CM

## 2021-07-10 DIAGNOSIS — E039 Hypothyroidism, unspecified: Secondary | ICD-10-CM

## 2021-07-10 DIAGNOSIS — F17200 Nicotine dependence, unspecified, uncomplicated: Secondary | ICD-10-CM

## 2021-07-10 NOTE — Patient Instructions (Signed)
Visit Information ? ?Thank you for taking time to visit with me today. Please don't hesitate to contact me if I can be of assistance to you before our next scheduled telephone appointment. ? ?Following are the goals we discussed today:  ?RNCM Clinical Goal(s):  ?Patient will verbalize basic understanding of HTN, Anxiety, Depression, Pulmonary Disease, Hypothyroidism, smoker,  and Chronic pain  disease process and self health management plan as evidenced by keeping appointments, following the plan of care, following dietary restrictions, adequate rest, and working with the CCM team to effectively manage health and well being. ?take all medications exactly as prescribed and will call provider for medication related questions as evidenced by compliance with medications and calling for refills before running out. The patient is having issues with her insurance as she switched insurances and her medications were going to cost >600.00. The patient has called and went back to her old insurance but it will not be in effect until June 17, 2021. The patient is going to pick up medications today all but the Spiriva and she has some of that on hand.     ?attend all scheduled medical appointments: 09-25-2021 at 10 am as evidenced by keeping appointments and calling for schedule change needs         ?demonstrate improved and ongoing adherence to prescribed treatment plan for HTN, Anxiety, Depression, Pulmonary Disease, Hypothyroidism, smoker, and chronic pain  as evidenced by calling the office for questions and concerns, keeping appointments, no exacerbation of conditions, stable VS, routine labwork and following established plan of care.  ?continue to work with Consulting civil engineer and/or Social Worker to address care management and care coordination needs related to HTN, Anxiety, Depression, Pulmonary Disease, Hypothyroidism, Tobacco Use, and Chronic pain  as evidenced by adherence to CM Team Scheduled appointments     ?work with  Education officer, museum to address [place] Concerns  related to the management of HTN, Anxiety, Depression, Pulmonary Disease, Hypothyroidism, Tobacco Use, and chronic pain  as evidenced by review of EMR and patient or social worker report     ?demonstrate ongoing self health care management ability for effective management of chronic conditions as evidenced by working with the CCM team through collaboration with Consulting civil engineer, provider, and care team.  ?  ?Interventions: ?1:1 collaboration with primary care provider regarding development and update of comprehensive plan of care as evidenced by provider attestation and co-signature ?Inter-disciplinary care team collaboration (see longitudinal plan of care) ?Evaluation of current treatment plan related to  self management and patient's adherence to plan as established by provider ?  ?  ?Asthma: (Status: Goal on Track (progressing): YES.) Long Term Goal  ?Provided patient with basic written and verbal Asthma education on self care/management/and exacerbation prevention. 07-10-2021: The patient denies any issues at this time with her Asthma or respiratory changes.  ?Advised patient to track and manage Asthma triggers. 07-10-2021: The patient is aware of triggers that cause exacerbation. Denies any issues with her asthma at this time;  ?Provided written and verbal instructions on pursed lip breathing and utilized returned demonstration as teach back; ?Provided instruction about proper use of medications used for management of Asthma including inhalers; ?Advised patient to self assesses Asthma action plan zone and make appointment with provider if in the yellow zone for 48 hours without improvement. 07-11-2021: The patient is compliant with the plan of care and knows to call for changes ?Advised patient to engage in light exercise as tolerated 3-5 days a week to  aid in the the management of Asthma; ?Provided education about and advised patient to utilize infection prevention  strategies to reduce risk of respiratory infection. 07-10-2021: The patient knows what factors cause increased risk of infection ?Discussed the importance of adequate rest and management of fatigue with Asthma; ?Screening for signs and symptoms of depression related to chronic disease state;  ?Assessed social determinant of health barriers;  ?  ?  ?Hypothyroidism  (Status: Goal on Track (progressing): YES.) Long Term Goal  ?  ?Evaluation of current treatment plan related to Hypothyroidism, Mental Health Concerns  self-management and patient's adherence to plan as established by provider. 07-10-2021: The patient has regular lab work.  ?Discussed plans with patient for ongoing care management follow up and provided patient with direct contact information for care management team ?Advised patient to call the office for changes in condition, new concerns or questions related to hypothyroidism; ?Provided education to patient re: importance of taking medications as prescribed. The patient was having issues with getting medications due to changes in insurances but she is switching back and the patient should be able to get her synthroid. 07-10-2021: The patient is compliant with medications. Denies any new medication needs at this time. ; ?Reviewed medications with patient and discussed compliance and taking as directed. 30 minutes to an hour before eating and not taking with other medications.07-10-2021: The patient is compliant with medications. Denies any needs at this time.; ?Provided patient with medications, sx and sx to look for with Hypothyroidism educational materials related to effective management of hypothyroidism; ?Reviewed scheduled/upcoming provider appointments including 09-25-2021 at 10 am; ?Discussed plans with patient for ongoing care management follow up and provided patient with direct contact information for care management team; ?Advised patient to discuss lab work and other questions and concerns related  to hypothyroidism with provider;  ?  ?Depression and Anxiety  (Status: Goal on Track (progressing): YES.) Long Term Goal  ?Evaluation of current treatment plan related to Anxiety and Depression, Mental Health Concerns  self-management and patient's adherence to plan as established by provider. 05-22-2021: The patient is doing well and was excited to tell the [facility] about her new adventure. 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 She states that this is giving her purpose in her life and now that she looks back at it she was having the "empty nest syndrome". The 3 oldest children will go back with their mother when she finds stable housing but her and her husband are considering adopting the 50 and 55 year old because their parents are in prison. The patient states this is helping her so much and she is so glad she did it. 07-10-2021: The patient is up beat and happy. She is doing well except for her feet hurting. She is exercising and enjoys riding her bike with the little ones. She feels very blessed to be caring for the 5 children in foster care entrusted to her care. She has been outside today with the little ones. The oldest 3 are at school. The patient states they will be home around 4. She feels they have been a positive influence on her health and well being. Denies any acute findings related to depression or anxiety.  ?Discussed plans with patient for ongoing care management follow up and provided patient with direct contact information for care management team ?Advised patient to call the office for changes in mood, anxiety, depression, or any questions or concerns with mental health conditions; ?Provided  education to patient re: working with the CCM team to optimize mental health well being. The patient is happy and doing well today. She has new purpose in her life and is happy to be fostering 5 children; ?Reviewed medications with patient and discussed compliance. The patient states  compliance with medications; ?Reviewed scheduled/upcoming provider appointments including 09-25-2021 at 10 am; ?Social Work referral for ongoing support and education; ?Discussed plans with patient for ongoin

## 2021-07-10 NOTE — Chronic Care Management (AMB) (Signed)
? Care Management ?  ? RN Visit Note ? ?07/10/2021 ?Name: Jodi Keith MRN: [id number] DOB: 06/18/1965 ? ?Subjective: ?Jodi BrideMartha Jane Pezzullo is a 56 y.o. year old female who is a primary care patient of Vigg, Avanti, MD. The care management team was consulted for assistance with disease management and care coordination needs.   ? ?Engaged with patient by telephone for follow up visit in response to provider referral for case management and/or care coordination services.  ? ?Consent to Services:  ? Ms. Vernon PreyGaddis was given information about Care Management services today including:  ?Care Management services includes personalized support from designated clinical staff supervised by her physician, including individualized plan of care and coordination with other care providers ?24/7 contact phone numbers for assistance for urgent and routine care needs. ?The patient may stop case management services at any time by phone call to the office staff. ? ?Patient agreed to services and consent obtained.  ? ?Assessment: Review of patient past medical history, allergies, medications, health status, including review of consultants reports, laboratory and other test data, was performed as part of comprehensive evaluation and provision of chronic care management services.  ? ?SDOH (Social Determinants of Health) assessments and interventions performed:   ? ?Care Plan ? ?Allergies  ?Allergen Reactions  ? Hydrochlorothiazide Other (See Comments)  ?  Causes severe muscle cramps  ? Prochlorperazine Edisylate Anaphylaxis  ?  coma  ? Contrast Media [Iodinated Contrast Media] Palpitations and Other (See Comments)  ?  Patient had severe hypotension, rapid heart beat after initial contrast injection "years ago." For recent procedure using IV contrast patient had appropriate 13 hour premedication.   ? ? ?Outpatient Encounter Medications as of 07/10/2021  ?Medication Sig  ? albuterol (VENTOLIN HFA) 108 (90 Base) MCG/ACT inhaler INHALE 2 PUFFS BY  MOUTH EVERY 6 HOURS AS NEEDED FOR WHEEZING FOR SHORTNESS OF BREATH  ? albuterol (VENTOLIN HFA) 108 (90 Base) MCG/ACT inhaler Inhale 1 puff into the lungs every 6 (six) hours as needed for wheezing or shortness of breath.  ? amLODipine (NORVASC) 10 MG tablet Take 1 tablet (10 mg total) by mouth daily.  ? atorvastatin (LIPITOR) 20 MG tablet Take 1 tablet by mouth once daily  ? buPROPion (WELLBUTRIN XL) 150 MG 24 hr tablet Take 1 tablet (150 mg total) by mouth daily.  ? famotidine (PEPCID) 20 MG tablet Take 1 tablet (20 mg total) by mouth at bedtime.  ? Homeopathic Products (RESTFUL LEGS SL) Place 1 tablet under the tongue at bedtime.  ? levothyroxine (SYNTHROID) 75 MCG tablet TAKE 1 TABLET BY MOUTH ONCE DAILY BEFORE  BREAKFAST  ? nicotine (NICODERM CQ) 21 mg/24hr patch Place 1 patch (21 mg total) onto the skin daily.  ? nystatin cream (MYCOSTATIN) Apply topically 2 (two) times daily.  ? pramipexole (MIRAPEX) 0.25 MG tablet Take 1 tablet (0.25 mg total) by mouth 3 (three) times daily.  ? SPIRIVA RESPIMAT 2.5 MCG/ACT AERS INHALE 2 SPRAY(S) BY MOUTH ONCE DAILY  ? VITAMIN D, ERGOCALCIFEROL, PO Take 5,000 Int'l Units/day by mouth daily.  ? ?No facility-administered encounter medications on file as of 07/10/2021.  ? ? ?Patient Active Problem List  ? Diagnosis Date Noted  ? Pain in both feet 06/25/2021  ? Primary hypertension 06/25/2021  ? Prediabetes 02/20/2021  ? Need for pneumococcal vaccine 02/20/2021  ? Chronic right-sided back pain 02/20/2021  ? Smoking addiction 01/23/2021  ? Chronic right-sided low back pain with right-sided sciatica 01/23/2021  ? RLS (restless legs syndrome) 01/23/2021  ?  Depression, recurrent (HCC) 01/23/2021  ? Need for influenza vaccination 01/23/2021  ? Cramp in limb 01/02/2021  ? Asthma case management patient 09/30/2020  ? SOB (shortness of breath) 09/30/2020  ? LUQ pain 09/30/2020  ? Hypothyroidism 09/30/2020  ? Polycythemia 09/30/2020  ? Moderate asthma with exacerbation 09/17/2020   ? ? ?Conditions to be addressed/monitored: HTN, Anxiety, Depression, Asthma, Hypothyroidism, Tobacco Use, and Chronic pain ? ?Care Plan : RNCM: General Plan of Care (Adult) for Chronic Disease Management and Care Coordination Needs  ?Updates made by Marlowe Sax, RN since 07/10/2021 12:00 AM  ?  ? ?Problem: RNCM: Development of plan of care for Chronic Disease Management (HTN, Hypothyroidism, Asthma, Depression, Anxiety, Chronic pain, Smoker)   ?Priority: High  ?  ? ?Long-Range Goal: RNCM: Effective Management of plan of care for Chronic Disease Management (HTN, Hypothyroidism, Asthma, Depression, Anxiety, Chronic pain, Smoker)   ?Start Date: 05/22/2021  ?Expected End Date: 05/22/2022  ?Priority: High  ?Note:   ?Current Barriers:  ?Knowledge Deficits related to plan of care for management of HTN, Chronic Pain, Pulmonary Disease, and Depression, anxiety, smoker, and hypothyroidism  ?Care Coordination needs related to Mental Health Concerns   ?Chronic Disease Management support and education needs related to HTN, Chronic Pain, Pulmonary Disease, smoker, and hypothyroidism, depression and anxiety ? ?RNCM Clinical Goal(s):  ?Patient will verbalize basic understanding of HTN, Anxiety, Depression, Pulmonary Disease, Hypothyroidism, smoker,  and Chronic pain  disease process and self health management plan as evidenced by keeping appointments, following the plan of care, following dietary restrictions, adequate rest, and working with the CCM team to effectively manage health and well being. ?take all medications exactly as prescribed and will call provider for medication related questions as evidenced by compliance with medications and calling for refills before running out. The patient is having issues with her insurance as she switched insurances and her medications were going to cost >600.00. The patient has called and went back to her old insurance but it will not be in effect until June 17, 2021. The patient is going to  pick up medications today all but the Spiriva and she has some of that on hand.     ?attend all scheduled medical appointments: 09-25-2021 at 10 am as evidenced by keeping appointments and calling for schedule change needs         ?demonstrate improved and ongoing adherence to prescribed treatment plan for HTN, Anxiety, Depression, Pulmonary Disease, Hypothyroidism, smoker, and chronic pain  as evidenced by calling the office for questions and concerns, keeping appointments, no exacerbation of conditions, stable VS, routine labwork and following established plan of care.  ?continue to work with Medical illustrator and/or Social Worker to address care management and care coordination needs related to HTN, Anxiety, Depression, Pulmonary Disease, Hypothyroidism, Tobacco Use, and Chronic pain  as evidenced by adherence to CM Team Scheduled appointments     ?work with Child psychotherapist to address Mental Health Concerns  related to the management of HTN, Anxiety, Depression, Pulmonary Disease, Hypothyroidism, Tobacco Use, and chronic pain  as evidenced by review of EMR and patient or social worker report     ?demonstrate ongoing self health care management ability for effective management of chronic conditions as evidenced by working with the CCM team through collaboration with Medical illustrator, provider, and care team.  ? ?Interventions: ?1:1 collaboration with primary care provider regarding development and update of comprehensive plan of care as evidenced by provider attestation and co-signature ?Inter-disciplinary care team collaboration (  see longitudinal plan of care) ?Evaluation of current treatment plan related to  self management and patient's adherence to plan as established by provider ? ? ?Asthma: (Status: Goal on Track (progressing): YES.) Long Term Goal  ?Provided patient with basic written and verbal Asthma education on self care/management/and exacerbation prevention. 07-10-2021: The patient denies any issues at this  time with her Asthma or respiratory changes.  ?Advised patient to track and manage Asthma triggers. 07-10-2021: The patient is aware of triggers that cause exacerbation. Denies any issues with her asthma at [facility]

## 2021-07-20 ENCOUNTER — Other Ambulatory Visit: Payer: Self-pay | Admitting: Internal Medicine

## 2021-07-21 NOTE — Telephone Encounter (Signed)
Requested Prescriptions  ?Pending Prescriptions Disp Refills  ?? levothyroxine (SYNTHROID) 75 MCG tablet [Pharmacy Med Name: Levothyroxine Sodium 75 MCG Oral Tablet] 90 tablet 3  ?  Sig: TAKE 1 TABLET BY MOUTH ONCE DAILY BEFORE BREAKFAST  ?  ? Endocrinology:  Hypothyroid Agents Passed - 07/20/2021  6:15 AM  ?  ?  Passed - TSH in normal range and within 360 days  ?  TSH  ?Date Value Ref Range Status  ?06/25/2021 2.870 0.450 - 4.500 uIU/mL Final  ?   ?  ?  Passed - Valid encounter within last 12 months  ?  Recent Outpatient Visits   ?      ? 3 weeks ago Hypothyroidism, unspecified type  ? [facility] Vigg, Avanti, MD  ? 5 months ago Prediabetes  ? [facility] Vigg, Avanti, MD  ? 5 months ago Need for influenza vaccination  ? [place], [place]  ? 8 months ago LUQ pain  ? [facility] Vigg, Avanti, MD  ? 9 months ago Morbid obesity ([place])  ? [facility] Vigg, Avanti, MD  ?  ?  ?Future Appointments   ?        ? In 2 months Vigg, Avanti, MD [facility]  ?  ? ?  ?  ?  ?? buPROPion (WELLBUTRIN XL) 150 MG 24 hr tablet [Pharmacy Med Name: buPROPion HCl ER (XL) 150 MG Oral Tablet Extended Release 24 Hour] 90 tablet 3  ?  Sig: Take 1 tablet by mouth once daily  ?  ? Psychiatry: Antidepressants - bupropion Failed - 07/20/2021  6:15 AM  ?  ?  Failed - Cr in normal range and within 360 days  ?  Creatinine, Ser  ?Date Value Ref Range Status  ?06/25/2021 1.09 (H) 0.57 - 1.00 mg/dL Final  ?   ?  ?  Passed - AST in normal range and within 360 days  ?  AST  ?Date Value Ref Range Status  ?06/25/2021 15 0 - 40 IU/L Final  ?   ?  ?  Passed - ALT in normal range and within 360 days  ?  ALT  ?Date Value Ref Range Status  ?06/25/2021 23 0 - 32 IU/L Final  ?   ?  ?  Passed - Completed PHQ-2 or PHQ-9 in the last 360 days  ?  ?  Passed - Last BP in normal range  ?  BP Readings from Last 1 Encounters:  ?06/25/21 132/77  ?   ?  ?  Passed - Valid encounter  within last 6 months  ?  Recent Outpatient Visits   ?      ? 3 weeks ago Hypothyroidism, unspecified type  ? [facility] Vigg, Avanti, MD  ? 5 months ago Prediabetes  ? [facility] Vigg, Avanti, MD  ? 5 months ago Need for influenza vaccination  ? [place], [place]  ? 8 months ago LUQ pain  ? [facility] Vigg, Avanti, MD  ? 9 months ago Morbid obesity ([place])  ? [facility] Vigg, Avanti, MD  ?  ?  ?Future Appointments   ?        ? In 2 months Vigg, Avanti, MD [facility]  ?  ? ?  ?  ?  ? ?

## 2021-08-20 ENCOUNTER — Other Ambulatory Visit: Payer: Self-pay | Admitting: Internal Medicine

## 2021-08-20 ENCOUNTER — Other Ambulatory Visit: Payer: Self-pay | Admitting: Nurse Practitioner

## 2021-08-20 MED ORDER — SPIRIVA RESPIMAT 2.5 MCG/ACT IN AERS
2.0000 | INHALATION_SPRAY | Freq: Every day | RESPIRATORY_TRACT | 5 refills | Status: DC
Start: 2021-08-20 — End: 2022-07-08

## 2021-08-20 MED ORDER — ALBUTEROL SULFATE HFA 108 (90 BASE) MCG/ACT IN AERS
1.0000 | INHALATION_SPRAY | Freq: Four times a day (QID) | RESPIRATORY_TRACT | 3 refills | Status: DC | PRN
Start: 2021-08-20 — End: 2022-04-28

## 2021-09-04 ENCOUNTER — Ambulatory Visit: Payer: Self-pay

## 2021-09-04 ENCOUNTER — Telehealth: Payer: 59

## 2021-09-04 DIAGNOSIS — G2581 Restless legs syndrome: Secondary | ICD-10-CM

## 2021-09-04 DIAGNOSIS — F17200 Nicotine dependence, unspecified, uncomplicated: Secondary | ICD-10-CM

## 2021-09-04 DIAGNOSIS — F32A Depression, unspecified: Secondary | ICD-10-CM

## 2021-09-04 DIAGNOSIS — I10 Essential (primary) hypertension: Secondary | ICD-10-CM

## 2021-09-04 DIAGNOSIS — E039 Hypothyroidism, unspecified: Secondary | ICD-10-CM

## 2021-09-04 DIAGNOSIS — R609 Edema, unspecified: Secondary | ICD-10-CM

## 2021-09-04 DIAGNOSIS — F339 Major depressive disorder, recurrent, unspecified: Secondary | ICD-10-CM

## 2021-09-04 DIAGNOSIS — J45901 Unspecified asthma with (acute) exacerbation: Secondary | ICD-10-CM

## 2021-09-04 DIAGNOSIS — G8929 Other chronic pain: Secondary | ICD-10-CM

## 2021-09-04 NOTE — Chronic Care Management (AMB) (Signed)
Care Management    RN Visit Note  09/04/2021 Name: Jodi Keith MRN: [id number] DOB: [birth year]  Subjective: Jodi Keith is a 56 y.o. year old female who is a primary care patient of Vigg, Avanti, MD. The care management team was consulted for assistance with disease management and care coordination needs.    Engaged with patient by telephone for follow up visit in response to provider referral for case management and/or care coordination services.   Consent to Services:   Ms. Cardy was given information about Care Management services today including:  Care Management services includes personalized support from designated clinical staff supervised by her physician, including individualized plan of care and coordination with other care providers 24/7 contact phone numbers for assistance for urgent and routine care needs. The patient may stop case management services at any time by phone call to the office staff.  Patient agreed to services and consent obtained.   Assessment: Review of patient past medical history, allergies, medications, health status, including review of consultants reports, laboratory and other test data, was performed as part of comprehensive evaluation and provision of chronic care management services.   SDOH (Social Determinants of Health) assessments and interventions performed:  SDOH Interventions    Flowsheet Row Most Recent Value  SDOH Interventions   Food Insecurity Interventions Intervention Not Indicated  Financial Strain Interventions Intervention Not Indicated  Housing Interventions Intervention Not Indicated  Physical Activity Interventions Other (Comments)  [The patient is walking and more active]  Stress Interventions Intervention Not Indicated  Social Connections Interventions Other (Comment)  [has a good family support]  Transportation Interventions Intervention Not Indicated        Care Plan  Allergies  Allergen Reactions    Hydrochlorothiazide Other (See Comments)    Causes severe muscle cramps   Prochlorperazine Edisylate Anaphylaxis    coma   Contrast Media [Iodinated Contrast Media] Palpitations and Other (See Comments)    Patient had severe hypotension, rapid heart beat after initial contrast injection "years ago." For recent procedure using IV contrast patient had appropriate 13 hour premedication.     Outpatient Encounter Medications as of 09/04/2021  Medication Sig   albuterol (VENTOLIN HFA) 108 (90 Base) MCG/ACT inhaler Inhale 1 puff into the lungs every 6 (six) hours as needed for wheezing or shortness of breath.   albuterol (VENTOLIN HFA) 108 (90 Base) MCG/ACT inhaler Inhale 1-2 puffs into the lungs every 6 (six) hours as needed for wheezing or shortness of breath.   amLODipine (NORVASC) 10 MG tablet Take 1 tablet (10 mg total) by mouth daily.   atorvastatin (LIPITOR) 20 MG tablet Take 1 tablet by mouth once daily   buPROPion (WELLBUTRIN XL) 150 MG 24 hr tablet Take 1 tablet by mouth once daily   famotidine (PEPCID) 20 MG tablet Take 1 tablet (20 mg total) by mouth at bedtime.   Homeopathic Products (RESTFUL LEGS SL) Place 1 tablet under the tongue at bedtime.   levothyroxine (SYNTHROID) 75 MCG tablet TAKE 1 TABLET BY MOUTH ONCE DAILY BEFORE BREAKFAST   nicotine (NICODERM CQ) 21 mg/24hr patch Place 1 patch (21 mg total) onto the skin daily.   nystatin cream (MYCOSTATIN) Apply topically 2 (two) times daily.   pramipexole (MIRAPEX) 0.25 MG tablet Take 1 tablet (0.25 mg total) by mouth 3 (three) times daily.   Tiotropium Bromide Monohydrate (SPIRIVA RESPIMAT) 2.5 MCG/ACT AERS Inhale 2 puffs into the lungs daily.   VITAMIN D, ERGOCALCIFEROL, PO Take 5,000 Int'l Units/day by mouth  daily.   No facility-administered encounter medications on file as of 09/04/2021.    Patient Active Problem List   Diagnosis Date Noted   Pain in both feet 06/25/2021   Primary hypertension 06/25/2021   Prediabetes 02/20/2021    Need for pneumococcal vaccine 02/20/2021   Chronic right-sided back pain 02/20/2021   Smoking addiction 01/23/2021   Chronic right-sided low back pain with right-sided sciatica 01/23/2021   RLS (restless legs syndrome) 01/23/2021   Depression, recurrent ([place]) 01/23/2021   Need for influenza vaccination 01/23/2021   Cramp in limb 01/02/2021   Asthma case management patient 09/30/2020   SOB (shortness of breath) 09/30/2020   LUQ pain 09/30/2020   Hypothyroidism 09/30/2020   Polycythemia 09/30/2020   Moderate asthma with exacerbation 09/17/2020    Conditions to be addressed/monitored: HTN, Anxiety, Depression, Asthma, Hypothyroidism, and chronic pain, weeping edema in left lower leg, cramps  Care Plan : RNCM: General Plan of Care (Adult) for Chronic Disease Management and Care Coordination Needs  Updates made by Vanita Ingles, RN since 09/04/2021 12:00 AM     Problem: RNCM: Development of plan of care for Chronic Disease Management (HTN, Hypothyroidism, Asthma, Depression, Anxiety, Chronic pain, Smoker)   Priority: High     Long-Range Goal: RNCM: Effective Management of plan of care for Chronic Disease Management (HTN, Hypothyroidism, Asthma, Depression, Anxiety, Chronic pain, Smoker)   Start Date: 05/22/2021  Expected End Date: 05/22/2022  Priority: High  Note:   Current Barriers:  Knowledge Deficits related to plan of care for management of HTN, Chronic Pain, Pulmonary Disease, and Depression, anxiety, smoker, and hypothyroidism  Care Coordination needs related to Mental Health Concerns   Chronic Disease Management support and education needs related to HTN, Chronic Pain, Pulmonary Disease, smoker, and hypothyroidism, depression and anxiety  RNCM Clinical Goal(s):  Patient will verbalize basic understanding of HTN, Anxiety, Depression, Pulmonary Disease, Hypothyroidism, smoker,  and Chronic pain  disease process and self health management plan as evidenced by keeping appointments,  following the plan of care, following dietary restrictions, adequate rest, and working with the CCM team to effectively manage health and well being. take all medications exactly as prescribed and will call provider for medication related questions as evidenced by compliance with medications and calling for refills before running out. The patient is having issues with her insurance as she switched insurances and her medications were going to cost >600.00. The patient has called and went back to her old insurance but it will not be in effect until June 17, 2021. The patient is going to pick up medications today all but the Spiriva and she has some of that on hand.     attend all scheduled medical appointments: 09-25-2021 at 10 am as evidenced by keeping appointments and calling for schedule change needs         demonstrate improved and ongoing adherence to prescribed treatment plan for HTN, Anxiety, Depression, Pulmonary Disease, Hypothyroidism, smoker, and chronic pain  as evidenced by calling the office for questions and concerns, keeping appointments, no exacerbation of conditions, stable VS, routine labwork and following established plan of care.  continue to work with Consulting civil engineer and/or Social Worker to address care management and care coordination needs related to HTN, Anxiety, Depression, Pulmonary Disease, Hypothyroidism, Tobacco Use, and Chronic pain  as evidenced by adherence to CM Team Scheduled appointments     work with Education officer, museum to address [place] Concerns  related to the management of HTN, Anxiety, Depression, Pulmonary  Disease, Hypothyroidism, Tobacco Use, and chronic pain  as evidenced by review of EMR and patient or social worker report     demonstrate ongoing self health care management ability for effective management of chronic conditions as evidenced by working with the CCM team through collaboration with Consulting civil engineer, provider, and care team.   Interventions: 1:1  collaboration with primary care provider regarding development and update of comprehensive plan of care as evidenced by provider attestation and co-signature Inter-disciplinary care team collaboration (see longitudinal plan of care) Evaluation of current treatment plan related to  self management and patient's adherence to plan as established by provider   Asthma: (Status: Goal on Track (progressing): YES.) Long Term Goal  Provided patient with basic written and verbal Asthma education on self care/management/and exacerbation prevention. 07-10-2021: The patient denies any issues at this time with her Asthma or respiratory changes.  Advised patient to track and manage Asthma triggers. 09-04-2021: The patient is aware of triggers that cause exacerbation. Denies any issues with her asthma at this time. Denies any issues with her Asthma at this time. The patient is stable with her breathing;  Provided written and verbal instructions on pursed lip breathing and utilized returned demonstration as teach back; Provided instruction about proper use of medications used for management of Asthma including inhalers; Advised patient to self assesses Asthma action plan zone and make appointment with provider if in the yellow zone for 48 hours without improvement. 09-04-2021: The patient is compliant with the plan of care and knows to call for changes Advised patient to engage in light exercise as tolerated 3-5 days a week to aid in the the management of Asthma; Provided education about and advised patient to utilize infection prevention strategies to reduce risk of respiratory infection. 09-04-2021: The patient knows what factors cause increased risk of infection. Is mindful of her surroundings and denies any acte findings today. Will continue to monitor for changes.  Discussed the importance of adequate rest and management of fatigue with Asthma; Screening for signs and symptoms of depression related to chronic disease  state;  Assessed social determinant of health barriers;    Hypothyroidism  (Status: Goal on Track (progressing): YES.) Long Term Goal   Evaluation of current treatment plan related to Hypothyroidism, Mental Health Concerns  self-management and patient's adherence to plan as established by provider. 09-02-2021: The patient has regular lab work.  Discussed plans with patient for ongoing care management follow up and provided patient with direct contact information for care management team Advised patient to call the office for changes in condition, new concerns or questions related to hypothyroidism; Provided education to patient re: importance of taking medications as prescribed. The patient was having issues with getting medications due to changes in insurances but she is switching back and the patient should be able to get her synthroid. 09-04-2021: The patient is compliant with medications. Denies any new medication needs at this time. ; Reviewed medications with patient and discussed compliance and taking as directed. 30 minutes to an hour before eating and not taking with other medications.09-04-2021: The patient is compliant with medications. Denies any needs at this time.; Provided patient with medications, sx and sx to look for with Hypothyroidism educational materials related to effective management of hypothyroidism; Reviewed scheduled/upcoming provider appointments including 09-25-2021 at 10 am; Discussed plans with patient for ongoing care management follow up and provided patient with direct contact information for care management team; Advised patient to discuss lab work and other questions and  concerns related to hypothyroidism with provider;   Depression and Anxiety  (Status: Goal on Track (progressing): YES.) Long Term Goal  Evaluation of current treatment plan related to Anxiety and Depression, Mental Health Concerns  self-management and patient's adherence to plan as established by  provider. 05-22-2021: The patient is doing well and was excited to tell the [facility] about her new adventure. She and her husband are fostering 5 children in range of 2 to 74. She states that this is giving her purpose in her life and now that she looks back at it she was having the "empty nest syndrome". The 3 oldest children will go back with their mother when she finds stable housing but her and her husband are considering adopting the 38 and 55 year old because their parents are in prison. The patient states this is helping her so much and she is so glad she did it. 07-10-2021: The patient is up beat and happy. She is doing well except for her feet hurting. She is exercising and enjoys riding her bike with the little ones. She feels very blessed to be caring for the 5 children in foster care entrusted to her care. She has been outside today with the little ones. The oldest 3 are at school. The patient states they will be home around 4. She feels they have been a positive influence on her health and well being. Denies any acute findings related to depression or anxiety. 09-04-2021: The patient is doing well with her depression and anxiety. She is having issues with cramping not only in her legs but generalized. She does not know why it is worse now. She only has the two smallest children now. The three older children have gone back to live with their mother. She states that the two younger children are staying with her and her husband and they are planning on adopting them. They go to court next month. She says the children bring her a lot of joy and this is the driving factor of her wanting to stay healthy and maintain her health and well being.  Discussed plans with patient for ongoing care management follow up and provided patient with direct contact information for care management team Advised patient to call the office for changes in mood, anxiety, depression, or any questions or concerns with mental health  conditions; Provided education to patient re: working with the CCM team to optimize mental health well being. The patient is happy and doing well today. She has new purpose in her life and is happy to be fostering 5 children. 09-04-2021: Has 2 children now, the 3 oldest have returned to live with their mother. She however sees them often. ; Reviewed medications with patient and discussed compliance. 09-04-2021 The patient states compliance with medications; Reviewed scheduled/upcoming provider appointments including 09-25-2021 at 10 am; Social Work referral for ongoing support and education; Discussed plans with patient for ongoing care management follow up and provided patient with direct contact information for care management team; Advised patient to discuss new concerns or questions about mental health with provider;  Hypertension: (Status: Goal on Track (progressing): YES.) Long Term Goal  Last practice recorded BP readings:  BP Readings from Last 3 Encounters:  06/25/21 132/77  02/20/21 98/62  01/23/21 127/63  Most recent eGFR/CrCl:  Lab Results  Component Value Date   EGFR 60 06/25/2021    No components found for: CRCL  Evaluation of current treatment plan related to hypertension self management and patient's adherence to  plan as established by provider. 09-04-2021: The patient is stable and having good blood pressures. Denies any issues with HTN or heart health. Will continue to monitor. ;   Provided education to patient re: stroke prevention, s/s of heart attack and stroke; Reviewed prescribed diet heart healthy diet. 09-04-2021: The patient is compliant with heart healthy diet.  Reviewed medications with patient and discussed importance of compliance. 09-04-2021: The patient is compliant with medications;  Discussed plans with patient for ongoing care management follow up and provided patient with direct contact information for care management team; Advised patient, providing education  and rationale, to monitor blood pressure daily and record, calling PCP for findings outside established parameters;  Advised patient to discuss blood pressure trends  with provider; Provided education on prescribed diet heart healthy diet ;  Discussed complications of poorly controlled blood pressure such as heart disease, stroke, circulatory complications, vision complications, kidney impairment, sexual dysfunction;   Pain:  (Status: Goal on Track (progressing): YES.) Long Term Goal  Pain assessment performed. Rates her buttocks pain today at a 9. She fell before Christmas and she feels like she may have fractured her tail bone but she states there is nothing they can do for that. The patient has generalized joint pain and discomfort and rates this at a 4 today. She states this is tolerable. 07-10-2021: The patient is not having pain today but has been having pain in her feet at times. She has discussed with the provider and xrays were recommended. She says they are not broke they just hurt. The patient states she will see how things go and will call for an earlier appointment if needed. 09-04-2021: The patient denies pain but states she is having more intense "cramps" and pretty much all over. Denies the needs to see pcp sooner than June. Education provided on calling the office for worsening sx and sx, or uncontrolled cramps.  Medications reviewed. 09-04-2021: Is compliant with medications. States that nothing is really helping with the cramps at this time. She is tolerating the best she can and will call for changes or if she feels she needs to see the provider sooner.  Reviewed provider established plan for pain management; Discussed importance of adherence to all scheduled medical appointments. 09-04-2021: The patient has an appointment on 09-25-2021 with the pcp. Knows to call for a sooner appointment if she changes her mind; Counseled on the importance of reporting any/all new or changed pain symptoms or  management strategies to pain management provider; Advised patient to report to care team affect of pain on daily activities; Discussed use of relaxation techniques and/or diversional activities to assist with pain reduction (distraction, imagery, relaxation, massage, acupressure, TENS, heat, and cold application; Reviewed with patient prescribed pharmacological and nonpharmacological pain relief strategies; Advised patient to discuss unresolved pain, changes in level or intensity of pain with provider;   Falls:  (Status: Goal on Track (progressing): YES.) Long Term Goal  Provided written and verbal education re: potential causes of falls and Fall prevention strategies Reviewed medications and discussed potential side effects of medications such as dizziness and frequent urination Advised patient of importance of notifying provider of falls. 09-04-2021: Reviewed with the patient and discussed the importance of notifying the provider of new falls Assessed for signs and symptoms of orthostatic hypotension Assessed for falls since last encounter. 07-10-2021: The patient states she had a fall about a week ago. She denies injuries. Had a fall in December and felt she broke her tail bone. She  denies any acute findings today. Education and support given. 09-04-2021: The patient denies any new falls. She states that she is being careful. Having a lot of cramps but is still mindful of safety.  Assessed patients knowledge of fall risk prevention secondary to previously provided education. 09-04-2021: Review of safety and fall concerns. Will continue to monitor.  Provided patient information for fall alert systems Advised patient to discuss fall and safety concerns with provider    Edema with weeping in left leg  (Status: New goal.) Short Term Goal  Evaluation of current treatment plan related to  edema with weeping in left leg ,  self-management and patient's adherence to plan as established by provider.  09-04-2021: The patient states that she has noticed a clear drainage coming from her left leg. She says the swelling does not go down no matter how much she elevated her legs. She states it is only in the left leg. Education and support given.  Discussed plans with patient for ongoing care management follow up and provided patient with direct contact information for care management team Advised patient to call the office for sx and sx of infection, smell from the discharge from her leg, pus-like drainage, fever, or chills. ; Provided education to patient re: keeping the site clean and dry, washing with mild anti-bacterial soap and patting dry, calling the office for worsening condition, and to keep appointments. Education on discussing weeping edema with the pcp at upcoming appointment. ; Reviewed scheduled/upcoming provider appointments including 09-25-2021 with pcp, knows to call for sooner appointment if needed. ; Discussed plans with patient for ongoing care management follow up and provided patient with direct contact information for care management team; Advised patient to discuss changes in leg edema or sx and sx of infection in leg with provider;    Smoking Cessation: (Status: Condition stable. 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 On a scale of 1-10, reports MOTIVATION to quit is 8 On a scale of 1-10, reports CONFIDENCE in quitting is 8  Evaluation of current treatment plan reviewed; Advised patient to discuss smoking cessation options with provider; Provided contact information for [place] Quit Line  (1-800-QUIT-NOW); Provided patient with printed smoking cessation educational materials; Reviewed scheduled/upcoming provider appointments including: 06-19-2021 at 10 am; Discussed plans with patient for ongoing care management follow up and provided patient with direct contact information for care management team; Advised patient to discuss smoking cessation options, new concerns or questions with respiratory health with provider;  Patient Goals/Self-Care Activities: Take medications as prescribed   Attend all scheduled provider appointments Call pharmacy for medication refills 3-7 days in advance of running out of medications Attend church or other social activities Perform all self care activities independently  Perform IADL's (shopping, preparing meals, housekeeping, managing finances) independently Call provider office for new concerns or questions  Work with the social worker to address care coordination needs and will continue to work with the clinical team to address health care and disease management related needs call the Suicide and Crisis Lifeline: 988 call the [place] National Suicide Prevention Lifeline: 636 787 2744 or TTY: 615-638-6485 TTY 380 225 4023) to  talk to a trained counselor call 1-800-273-TALK (toll free, 24 hour hotline) if experiencing a Mental Health or [place]  - avoid second hand smoke - eliminate smoking in my home - identify and avoid work-related triggers - identify and remove indoor air pollutants - limit outdoor activity during cold weather - listen for public air quality announcements every day - do breathing exercises every day - develop a rescue plan - eliminate symptom triggers at home - follow rescue plan if symptoms flare-up - keep follow-up appointments: 06-19-2021 at 10 am - use an extra pillow to sleep - develop a new routine to improve sleep - don't eat or exercise right before bedtime - eat healthy/prescribed diet: heart  healthy/ADA diet (pre-DM) - get at least 7 to 8 hours of sleep at night - use devices that will help like a cane, sock-puller or reacher - practice relaxation or meditation daily - do exercises in a comfortable position that makes breathing as easy as possible check blood pressure 3 times per week choose a place to take my blood pressure (home, clinic or office, retail store) write blood pressure results in a log or diary learn about high blood pressure keep a blood pressure log take blood pressure log to all doctor appointments call doctor for signs and symptoms of high blood pressure develop an action plan for high blood pressure keep all doctor appointments take medications for blood pressure exactly as prescribed report new symptoms to your doctor eat more whole grains, fruits and vegetables, lean meats and healthy fats       Plan: Telephone follow up appointment with care management team member scheduled for:  11-04-2021 at 1145 am  Noreene Larsson RN, MSN, [facility] Mobile: [phone]

## 2021-09-04 NOTE — Patient Instructions (Signed)
Visit Information  Thank you for taking time to visit with me today. Please don't hesitate to contact me if I can be of assistance to you before our next scheduled telephone appointment.  Following are the goals we discussed today:  Asthma: (Status: Goal on Track (progressing): YES.) Long Term Goal  Provided patient with basic written and verbal Asthma education on self care/management/and exacerbation prevention. 07-10-2021: The patient denies any issues at this time with her Asthma or respiratory changes.  Advised patient to track and manage Asthma triggers. 09-04-2021: The patient is aware of triggers that cause exacerbation. Denies any issues with her asthma at this time. Denies any issues with her Asthma at this time. The patient is stable with her breathing;  Provided written and verbal instructions on pursed lip breathing and utilized returned demonstration as teach back; Provided instruction about proper use of medications used for management of Asthma including inhalers; Advised patient to self assesses Asthma action plan zone and make appointment with provider if in the yellow zone for 48 hours without improvement. 09-04-2021: The patient is compliant with the plan of care and knows to call for changes Advised patient to engage in light exercise as tolerated 3-5 days a week to aid in the the management of Asthma; Provided education about and advised patient to utilize infection prevention strategies to reduce risk of respiratory infection. 09-04-2021: The patient knows what factors cause increased risk of infection. Is mindful of her surroundings and denies any acte findings today. Will continue to monitor for changes.  Discussed the importance of adequate rest and management of fatigue with Asthma; Screening for signs and symptoms of depression related to chronic disease state;  Assessed social determinant of health barriers;      Hypothyroidism  (Status: Goal on Track (progressing): YES.)  Long Term Goal    Evaluation of current treatment plan related to Hypothyroidism, Mental Health Concerns  self-management and patient's adherence to plan as established by provider. 09-02-2021: The patient has regular lab work.  Discussed plans with patient for ongoing care management follow up and provided patient with direct contact information for care management team Advised patient to call the office for changes in condition, new concerns or questions related to hypothyroidism; Provided education to patient re: importance of taking medications as prescribed. The patient was having issues with getting medications due to changes in insurances but she is switching back and the patient should be able to get her synthroid. 09-04-2021: The patient is compliant with medications. Denies any new medication needs at this time. ; Reviewed medications with patient and discussed compliance and taking as directed. 30 minutes to an hour before eating and not taking with other medications.09-04-2021: The patient is compliant with medications. Denies any needs at this time.; Provided patient with medications, sx and sx to look for with Hypothyroidism educational materials related to effective management of hypothyroidism; Reviewed scheduled/upcoming provider appointments including 09-25-2021 at 10 am; Discussed plans with patient for ongoing care management follow up and provided patient with direct contact information for care management team; Advised patient to discuss lab work and other questions and concerns related to hypothyroidism with provider;    Depression and Anxiety  (Status: Goal on Track (progressing): YES.) Long Term Goal  Evaluation of current treatment plan related to Anxiety and Depression, Mental Health Concerns  self-management and patient's adherence to plan as established by provider. 05-22-2021: The patient is doing well and was excited to tell the [facility] about her new adventure. She and  her husband  are fostering 5 children in range of 2 to 41. She states that this is giving her purpose in her life and now that she looks back at it she was having the "empty nest syndrome". The 3 oldest children will go back with their mother when she finds stable housing but her and her husband are considering adopting the 107 and 55 year old because their parents are in prison. The patient states this is helping her so much and she is so glad she did it. 07-10-2021: The patient is up beat and happy. She is doing well except for her feet hurting. She is exercising and enjoys riding her bike with the little ones. She feels very blessed to be caring for the 5 children in foster care entrusted to her care. She has been outside today with the little ones. The oldest 3 are at school. The patient states they will be home around 4. She feels they have been a positive influence on her health and well being. Denies any acute findings related to depression or anxiety. 09-04-2021: The patient is doing well with her depression and anxiety. She is having issues with cramping not only in her legs but generalized. She does not know why it is worse now. She only has the two smallest children now. The three older children have gone back to live with their mother. She states that the two younger children are staying with her and her husband and they are planning on adopting them. They go to court next month. She says the children bring her a lot of joy and this is the driving factor of her wanting to stay healthy and maintain her health and well being.  Discussed plans with patient for ongoing care management follow up and provided patient with direct contact information for care management team Advised patient to call the office for changes in mood, anxiety, depression, or any questions or concerns with mental health conditions; Provided education to patient re: working with the CCM team to optimize mental health well being. The patient is happy  and doing well today. She has new purpose in her life and is happy to be fostering 5 children. 09-04-2021: Has 2 children now, the 3 oldest have returned to live with their mother. She however sees them often. ; Reviewed medications with patient and discussed compliance. 09-04-2021 The patient states compliance with medications; Reviewed scheduled/upcoming provider appointments including 09-25-2021 at 10 am; Social Work referral for ongoing support and education; Discussed plans with patient for ongoing care management follow up and provided patient with direct contact information for care management team; Advised patient to discuss new concerns or questions about mental health with provider;   Hypertension: (Status: Goal on Track (progressing): YES.) Long Term Goal  Last practice recorded BP readings:     BP Readings from Last 3 Encounters:  06/25/21 132/77  02/20/21 98/62  01/23/21 127/63  Most recent eGFR/CrCl:       Lab Results  Component Value Date    EGFR 60 06/25/2021    No components found for: CRCL   Evaluation of current treatment plan related to hypertension self management and patient's adherence to plan as established by provider. 09-04-2021: The patient is stable and having good blood pressures. Denies any issues with HTN or heart health. Will continue to monitor. ;   Provided education to patient re: stroke prevention, s/s of heart attack and stroke; Reviewed prescribed diet heart healthy diet. 09-04-2021: The patient is compliant  with heart healthy diet.  Reviewed medications with patient and discussed importance of compliance. 09-04-2021: The patient is compliant with medications;  Discussed plans with patient for ongoing care management follow up and provided patient with direct contact information for care management team; Advised patient, providing education and rationale, to monitor blood pressure daily and record, calling PCP for findings outside established parameters;   Advised patient to discuss blood pressure trends  with provider; Provided education on prescribed diet heart healthy diet ;  Discussed complications of poorly controlled blood pressure such as heart disease, stroke, circulatory complications, vision complications, kidney impairment, sexual dysfunction;    Pain:  (Status: Goal on Track (progressing): YES.) Long Term Goal  Pain assessment performed. Rates her buttocks pain today at a 9. She fell before Christmas and she feels like she may have fractured her tail bone but she states there is nothing they can do for that. The patient has generalized joint pain and discomfort and rates this at a 4 today. She states this is tolerable. 07-10-2021: The patient is not having pain today but has been having pain in her feet at times. She has discussed with the provider and xrays were recommended. She says they are not broke they just hurt. The patient states she will see how things go and will call for an earlier appointment if needed. 09-04-2021: The patient denies pain but states she is having more intense "cramps" and pretty much all over. Denies the needs to see pcp sooner than June. Education provided on calling the office for worsening sx and sx, or uncontrolled cramps.  Medications reviewed. 09-04-2021: Is compliant with medications. States that nothing is really helping with the cramps at this time. She is tolerating the best she can and will call for changes or if she feels she needs to see the provider sooner.  Reviewed provider established plan for pain management; Discussed importance of adherence to all scheduled medical appointments. 09-04-2021: The patient has an appointment on 09-25-2021 with the pcp. Knows to call for a sooner appointment if she changes her mind; Counseled on the importance of reporting any/all new or changed pain symptoms or management strategies to pain management provider; Advised patient to report to care team affect of pain on daily  activities; Discussed use of relaxation techniques and/or diversional activities to assist with pain reduction (distraction, imagery, relaxation, massage, acupressure, TENS, heat, and cold application; Reviewed with patient prescribed pharmacological and nonpharmacological pain relief strategies; Advised patient to discuss unresolved pain, changes in level or intensity of pain with provider;     Falls:  (Status: Goal on Track (progressing): YES.) Long Term Goal  Provided written and verbal education re: potential causes of falls and Fall prevention strategies Reviewed medications and discussed potential side effects of medications such as dizziness and frequent urination Advised patient of importance of notifying provider of falls. 09-04-2021: Reviewed with the patient and discussed the importance of notifying the provider of new falls Assessed for signs and symptoms of orthostatic hypotension Assessed for falls since last encounter. 07-10-2021: The patient states she had a fall about a week ago. She denies injuries. Had a fall in December and felt she broke her tail bone. She denies any acute findings today. Education and support given. 09-04-2021: The patient denies any new falls. She states that she is being careful. Having a lot of cramps but is still mindful of safety.  Assessed patients knowledge of fall risk prevention secondary to previously provided education. 09-04-2021: Review of safety  and fall concerns. Will continue to monitor.  Provided patient information for fall alert systems Advised patient to discuss fall and safety concerns with provider      Edema with weeping in left leg  (Status: New goal.) Short Term Goal  Evaluation of current treatment plan related to  edema with weeping in left leg ,  self-management and patient's adherence to plan as established by provider. 09-04-2021: The patient states that she has noticed a clear drainage coming from her left leg. She says the swelling  does not go down no matter how much she elevated her legs. She states it is only in the left leg. Education and support given.  Discussed plans with patient for ongoing care management follow up and provided patient with direct contact information for care management team Advised patient to call the office for sx and sx of infection, smell from the discharge from her leg, pus-like drainage, fever, or chills. ; Provided education to patient re: keeping the site clean and dry, washing with mild anti-bacterial soap and patting dry, calling the office for worsening condition, and to keep appointments. Education on discussing weeping edema with the pcp at upcoming appointment. ; Reviewed scheduled/upcoming provider appointments including 09-25-2021 with pcp, knows to call for sooner appointment if needed. ; Discussed plans with patient for ongoing care management follow up and provided patient with direct contact information for care management team; Advised patient to discuss changes in leg edema or sx and sx of infection in leg with provider;      Smoking Cessation: (Status: Condition stable. Not addressed this visit.) Long Term Goal   Our next appointment is by telephone on 11-04-2021 at 1145 am  Please call the care guide team at [phone] if you need to cancel or reschedule your appointment.   If you are experiencing a Mental Health or [place] or need someone to talk to, please call the Suicide and Crisis Lifeline: 988 call the [place] National Suicide Prevention Lifeline: 3258505632 or TTY: 231-587-5801 TTY 312 516 3329) to talk to a trained counselor call 1-800-273-TALK (toll free, 24 hour hotline)   Patient verbalizes understanding of instructions and care plan provided today and agrees to view in [place]. Active MyChart status and patient understanding of how to access instructions and care plan via MyChart confirmed with patient.     Telephone follow up appointment with  care management team member scheduled for: 11-04-2021 at 1145 am  Noreene Larsson RN, MSN, [facility] Mobile: [phone]

## 2021-09-18 ENCOUNTER — Other Ambulatory Visit: Payer: Self-pay | Admitting: Internal Medicine

## 2021-09-18 NOTE — Telephone Encounter (Signed)
Requested Prescriptions  Pending Prescriptions Disp Refills  . pramipexole (MIRAPEX) 0.25 MG tablet [Pharmacy Med Name: Pramipexole Dihydrochloride 0.25 MG Oral Tablet] 90 tablet 2    Sig: TAKE 1 TABLET BY MOUTH THREE TIMES DAILY     Neurology:  Parkinsonian Agents Passed - 09/18/2021  6:39 AM      Passed - Last BP in normal range    BP Readings from Last 1 Encounters:  06/25/21 132/77         Passed - Last Heart Rate in normal range    Pulse Readings from Last 1 Encounters:  06/25/21 81         Passed - Valid encounter within last 12 months    Recent Outpatient Visits          2 months ago Hypothyroidism, unspecified type   [facility] Vigg, Avanti, MD   7 months ago Prediabetes   [facility] Vigg, Avanti, MD   7 months ago Need for influenza vaccination   [facility] Vigg, Avanti, MD   10 months ago LUQ pain   [facility] Vigg, Avanti, MD   11 months ago Morbid obesity (HCC)   [facility] Vigg, Avanti, MD      Future Appointments            In 1 week Mecum, Oswaldo Conroy, PA-C [employer], [facility]

## 2021-09-21 NOTE — Addendum Note (Signed)
Encounter addended by: Annie Paras on: 09/21/2021 12:22 PM  Actions taken: Letter saved

## 2021-09-24 ENCOUNTER — Encounter: Payer: Self-pay | Admitting: Podiatry

## 2021-09-24 ENCOUNTER — Ambulatory Visit: Payer: 59 | Admitting: Podiatry

## 2021-09-24 DIAGNOSIS — B070 Plantar wart: Secondary | ICD-10-CM

## 2021-09-25 ENCOUNTER — Ambulatory Visit: Payer: 59 | Admitting: Internal Medicine

## 2021-09-25 ENCOUNTER — Ambulatory Visit: Payer: 59 | Admitting: Physician Assistant

## 2021-09-28 ENCOUNTER — Ambulatory Visit: Payer: 59 | Admitting: Physician Assistant

## 2021-09-28 NOTE — Progress Notes (Signed)
  Subjective:  Patient ID: Jodi Keith, female    DOB: 1965/10/19,  MRN: [id number]  Marylou Wages presents to clinic today for  plantar warts bilaterally. She has been followed by Dr. Marylene Land for periodic debridement and application of Salinocaine. Patient states lesions are tender when they grow back. Pain is aggravated with weightbearing.  New problem(s): None.   PCP is [name], MD , and last visit was June 25, 2021.  Allergies  Allergen Reactions   Hydrochlorothiazide Other (See Comments)    Causes severe muscle cramps   Prochlorperazine Edisylate Anaphylaxis    coma   Contrast Media [Iodinated Contrast Media] Palpitations and Other (See Comments)    Patient had severe hypotension, rapid heart beat after initial contrast injection "years ago." For recent procedure using IV contrast patient had appropriate 13 hour premedication.     Review of Systems: Negative except as noted in the HPI.  Objective: No changes noted in today's physical examination.  Vascular Examination: Vascular status intact b/l with palpable pedal pulses. Pedal hair present b/l. CFT immediate b/l. No edema. No pain with calf compression b/l. Skin temperature gradient WNL b/l.   Neurological Examination: Sensation grossly intact b/l with 10 gram monofilament. Vibratory sensation intact b/l.   Dermatological Examination: Pedal skin with normal turgor, texture and tone b/l. Toenails 1-5 b/l adequate length. Multiple verrucoid lesions, flat, with subdermal hemorrhage noted on plantar aspect of submet head 1 left foot and submet head 5 b/l.  Musculoskeletal Examination: Muscle strength 5/5 to b/l LE. Muscle strength 5/5 to all lower extremity muscle groups bilaterally. No pain, crepitus or joint limitation noted with ROM bilateral LE.  Radiographs: None  Assessment/Plan: 1. Plantar wart of both feet     -Patient was evaluated and treated. All patient's and/or POA's questions/concerns answered on  today's visit. -Excisional debridement of lesions submet head 1 left and submet head 5 b/l was performed with sterile dermal currette and Salinocaine Ointment was applied. -Patient/POA to call should there be question/concern in the interim.   Return in about 3 months (around 12/25/2021).  Freddie Breech, DPM

## 2021-10-30 ENCOUNTER — Ambulatory Visit: Payer: 59 | Admitting: Nurse Practitioner

## 2021-10-30 NOTE — Progress Notes (Deleted)
LMP  (LMP Unknown)    Subjective:    Patient ID: Jodi Keith, female    DOB: 04/29/1965, 56 y.o.   MRN: [id number]  HPI: Jodi Keith is a 56 y.o. female  No chief complaint on file.   Relevant past medical, surgical, family and social history reviewed and updated as indicated. Interim medical history since our last visit reviewed. Allergies and medications reviewed and updated.  Review of Systems  Per HPI unless specifically indicated above     Objective:    LMP  (LMP Unknown)   Wt Readings from Last 3 Encounters:  06/25/21 226 lb 9.6 oz (102.8 kg)  02/20/21 231 lb 9.6 oz (105.1 kg)  01/23/21 237 lb (107.5 kg)    Physical Exam  Results for orders placed or performed in visit on 06/25/21  CBC with Differential/Platelet  Result Value Ref Range   WBC 8.6 3.4 - 10.8 x10E3/uL   RBC 5.05 3.77 - 5.28 x10E6/uL   Hemoglobin 15.2 11.1 - 15.9 g/dL   Hematocrit 71.8 89.1 - 46.6 %   MCV 90 79 - 97 fL   MCH 30.1 26.6 - 33.0 pg   MCHC 33.6 31.5 - 35.7 g/dL   RDW 43.5 00.0 - 38.1 %   Platelets 305 150 - 450 x10E3/uL   Neutrophils 59 Not Estab. %   Lymphs 31 Not Estab. %   Monocytes 5 Not Estab. %   Eos 3 Not Estab. %   Basos 1 Not Estab. %   Neutrophils Absolute 5.2 1.4 - 7.0 x10E3/uL   Lymphocytes Absolute 2.7 0.7 - 3.1 x10E3/uL   Monocytes Absolute 0.4 0.1 - 0.9 x10E3/uL   EOS (ABSOLUTE) 0.2 0.0 - 0.4 x10E3/uL   Basophils Absolute 0.1 0.0 - 0.2 x10E3/uL   Immature Granulocytes 1 Not Estab. %   Immature Grans (Abs) 0.1 0.0 - 0.1 x10E3/uL  Comprehensive metabolic panel  Result Value Ref Range   Glucose 87 70 - 99 mg/dL   BUN 12 6 - 24 mg/dL   Creatinine, Ser 9.04 (H) 0.57 - 1.00 mg/dL   eGFR 60 >09 SM/VOU/5.93   BUN/Creatinine Ratio 11 9 - 23   Sodium 141 134 - 144 mmol/L   Potassium 4.6 3.5 - 5.2 mmol/L   Chloride 101 96 - 106 mmol/L   CO2 20 20 - 29 mmol/L   Calcium 9.9 8.7 - 10.2 mg/dL   Total Protein 7.3 6.0 - 8.5 g/dL   Albumin 4.3 3.8 - 4.9 g/dL    Globulin, Total 3.0 1.5 - 4.5 g/dL   Albumin/Globulin Ratio 1.4 1.2 - 2.2   Bilirubin Total <0.2 0.0 - 1.2 mg/dL   Alkaline Phosphatase 145 (H) 44 - 121 IU/L   AST 15 0 - 40 IU/L   ALT 23 0 - 32 IU/L  TSH  Result Value Ref Range   TSH 2.870 0.450 - 4.500 uIU/mL  Urinalysis, Routine w reflex microscopic  Result Value Ref Range   Specific Gravity, UA 1.020 1.005 - 1.030   pH, UA 6.0 5.0 - 7.5   Color, UA Yellow Yellow   Appearance Ur Clear Clear   Leukocytes,UA Negative Negative   Protein,UA Negative Negative/Trace   Glucose, UA Negative Negative   Ketones, UA Negative Negative   RBC, UA Negative Negative   Bilirubin, UA Negative Negative   Urobilinogen, Ur 0.2 0.2 - 1.0 mg/dL   Nitrite, UA Negative Negative  T4, free  Result Value Ref Range   Free T4 1.24 0.82 -  1.77 ng/dL      Assessment & Plan:   Problem List Items Addressed This Visit   None    Follow up plan: No follow-ups on file.

## 2021-11-04 ENCOUNTER — Telehealth: Payer: Self-pay

## 2021-11-04 ENCOUNTER — Telehealth: Payer: 59

## 2021-11-04 NOTE — Telephone Encounter (Signed)
  Care Management   Follow Up Note   11/04/2021 Name: Jodi Keith MRN: [id number] DOB: 05-May-1965   Referred by: [facility], [facility] Reason for referral : Care Coordination (RNCM: Follow up for Chronic Disease Management and Care coordination Needs)   An unsuccessful telephone outreach was attempted today. The patient was referred to the case management team for assistance with care management and care coordination.   Follow Up Plan: No further follow up required: The patient is stable at this time. Will reengage in the future if needed.  Alto Denver RN, MSN, CCM Community Care Coordinator [place]  [facility] Mobile: [phone]

## 2021-12-31 ENCOUNTER — Ambulatory Visit (INDEPENDENT_AMBULATORY_CARE_PROVIDER_SITE_OTHER): Payer: Self-pay | Admitting: Podiatry

## 2021-12-31 DIAGNOSIS — R252 Cramp and spasm: Secondary | ICD-10-CM | POA: Diagnosis not present

## 2021-12-31 DIAGNOSIS — I10 Essential (primary) hypertension: Secondary | ICD-10-CM | POA: Diagnosis not present

## 2021-12-31 DIAGNOSIS — Z91199 Patient's noncompliance with other medical treatment and regimen due to unspecified reason: Secondary | ICD-10-CM

## 2021-12-31 NOTE — Progress Notes (Signed)
1. No-show for appointment     

## 2022-02-20 DIAGNOSIS — R69 Illness, unspecified: Secondary | ICD-10-CM | POA: Diagnosis not present

## 2022-02-20 DIAGNOSIS — S92312A Displaced fracture of first metatarsal bone, left foot, initial encounter for closed fracture: Secondary | ICD-10-CM | POA: Diagnosis not present

## 2022-02-20 DIAGNOSIS — S93325A Dislocation of tarsometatarsal joint of left foot, initial encounter: Secondary | ICD-10-CM | POA: Diagnosis not present

## 2022-02-20 DIAGNOSIS — W010XXA Fall on same level from slipping, tripping and stumbling without subsequent striking against object, initial encounter: Secondary | ICD-10-CM | POA: Diagnosis not present

## 2022-02-20 DIAGNOSIS — S9032XA Contusion of left foot, initial encounter: Secondary | ICD-10-CM | POA: Diagnosis not present

## 2022-02-20 DIAGNOSIS — S99922A Unspecified injury of left foot, initial encounter: Secondary | ICD-10-CM | POA: Diagnosis not present

## 2022-02-20 DIAGNOSIS — E669 Obesity, unspecified: Secondary | ICD-10-CM | POA: Diagnosis not present

## 2022-02-20 DIAGNOSIS — M7989 Other specified soft tissue disorders: Secondary | ICD-10-CM | POA: Diagnosis not present

## 2022-02-20 DIAGNOSIS — Y92524 Gas station as the place of occurrence of the external cause: Secondary | ICD-10-CM | POA: Diagnosis not present

## 2022-02-20 DIAGNOSIS — M79672 Pain in left foot: Secondary | ICD-10-CM | POA: Diagnosis not present

## 2022-02-20 DIAGNOSIS — Z79899 Other long term (current) drug therapy: Secondary | ICD-10-CM | POA: Diagnosis not present

## 2022-02-20 DIAGNOSIS — J45909 Unspecified asthma, uncomplicated: Secondary | ICD-10-CM | POA: Diagnosis not present

## 2022-02-22 ENCOUNTER — Telehealth: Payer: Self-pay | Admitting: Nurse Practitioner

## 2022-02-22 ENCOUNTER — Encounter: Payer: Self-pay | Admitting: Nurse Practitioner

## 2022-02-22 DIAGNOSIS — S93324D Dislocation of tarsometatarsal joint of right foot, subsequent encounter: Secondary | ICD-10-CM

## 2022-02-22 NOTE — Telephone Encounter (Signed)
Copied from [place] [id number]. Topic: Referral - Question >> Feb 22, 2022 11:02 AM Penni Bombard wrote: Reason for CRM: pt was in the ER Saturday at [facility] for a broken.  They referred her to a provider not in her network.  She is wanting to go to an orthopedic in [place].  CB@  [phone]

## 2022-02-22 NOTE — Telephone Encounter (Signed)
Spoke with patient to inform her of referral being placed. No further action needed at this time.

## 2022-02-22 NOTE — Telephone Encounter (Signed)
Copied from CRM #[id number]. Topic: Referral - Question >> Feb 22, 2022 11:02 AM Teressa P wrote: Reason for CRM: pt was in the ER Saturday at [facility] for a broken.  They referred her to a provider not in her network.  She is wanting to go to an orthopedic in [place].  CB@  [phone] 

## 2022-02-22 NOTE — Telephone Encounter (Signed)
Referral placed.

## 2022-03-05 DIAGNOSIS — S92902A Unspecified fracture of left foot, initial encounter for closed fracture: Secondary | ICD-10-CM | POA: Diagnosis not present

## 2022-03-05 DIAGNOSIS — S8002XA Contusion of left knee, initial encounter: Secondary | ICD-10-CM | POA: Diagnosis not present

## 2022-03-08 DIAGNOSIS — S93324D Dislocation of tarsometatarsal joint of right foot, subsequent encounter: Secondary | ICD-10-CM | POA: Diagnosis not present

## 2022-03-15 ENCOUNTER — Other Ambulatory Visit: Payer: Self-pay

## 2022-03-15 MED ORDER — ALBUTEROL SULFATE HFA 108 (90 BASE) MCG/ACT IN AERS: 1.0000 | INHALATION_SPRAY | Freq: Four times a day (QID) | RESPIRATORY_TRACT | 0 refills | Status: DC | PRN

## 2022-03-15 NOTE — Telephone Encounter (Signed)
Previous Dr. Charlotta Newton patient, has appointment 04/06/22

## 2022-03-22 DIAGNOSIS — Z720 Tobacco use: Secondary | ICD-10-CM | POA: Diagnosis not present

## 2022-03-22 DIAGNOSIS — S93324D Dislocation of tarsometatarsal joint of right foot, subsequent encounter: Secondary | ICD-10-CM | POA: Diagnosis not present

## 2022-03-22 DIAGNOSIS — R7303 Prediabetes: Secondary | ICD-10-CM | POA: Diagnosis not present

## 2022-03-22 DIAGNOSIS — M25572 Pain in left ankle and joints of left foot: Secondary | ICD-10-CM | POA: Diagnosis not present

## 2022-03-22 DIAGNOSIS — I509 Heart failure, unspecified: Secondary | ICD-10-CM | POA: Diagnosis not present

## 2022-04-06 ENCOUNTER — Ambulatory Visit: Payer: Self-pay | Admitting: Nurse Practitioner

## 2022-04-13 DIAGNOSIS — E782 Mixed hyperlipidemia: Secondary | ICD-10-CM | POA: Insufficient documentation

## 2022-04-13 NOTE — Progress Notes (Deleted)
LMP  (LMP Unknown)    Subjective:    Patient ID: Jodi Keith, female    DOB: 1965-07-18, 56 y.o.   MRN: [id number]  HPI: Jodi Keith is a 56 y.o. female presenting on 04/14/2022 for comprehensive medical examination. Current medical complaints include:{Blank single:19197::"none","***"}  She currently lives with: Menopausal Symptoms: {Blank single:19197::"yes","no"}  HYPERTENSION / [place] Satisfied with current treatment? {Blank single:19197::"yes","no"} Duration of hypertension: {Blank single:19197::"chronic","months","years"} BP monitoring frequency: {Blank single:19197::"not checking","rarely","daily","weekly","monthly","a few times a day","a few times a week","a few times a month"} BP range:  BP medication side effects: {Blank single:19197::"yes","no"} Past BP meds: {Blank BWGYKZLD:35701::"XBLT","JQZESPQZRA","QTMAUQJFHL/KTGYBWLSLH","TDSKAJGO","TLXBWIOMBT","DHRCBULAGT/XMIW","OEHOZYYQMG (bystolic)","carvedilol","chlorthalidone","clonidine","diltiazem","exforge HCT","HCTZ","irbesartan (avapro)","labetalol","lisinopril","lisinopril-HCTZ","losartan (cozaar)","methyldopa","nifedipine","olmesartan (benicar)","olmesartan-HCTZ","quinapril","ramipril","spironalactone","tekturna","valsartan","valsartan-HCTZ","verapamil"} Duration of hyperlipidemia: {Blank single:19197::"chronic","months","years"} Cholesterol medication side effects: {Blank single:19197::"yes","no"} Cholesterol supplements: {Blank multiple:19196::"none","fish oil","niacin","red yeast rice"} Past cholesterol medications: {Blank multiple:19196::"none","atorvastain (lipitor)","lovastatin (mevacor)","pravastatin (pravachol)","rosuvastatin (crestor)","simvastatin (zocor)","vytorin","fenofibrate (tricor)","gemfibrozil","ezetimide (zetia)","niaspan","lovaza"} Medication compliance: {Blank single:19197::"excellent compliance","good compliance","fair compliance","poor compliance"} Aspirin: {Blank  single:19197::"yes","no"} Recent stressors: {Blank single:19197::"yes","no"} Recurrent headaches: {Blank single:19197::"yes","no"} Visual changes: {Blank single:19197::"yes","no"} Palpitations: {Blank single:19197::"yes","no"} Dyspnea: {Blank single:19197::"yes","no"} Chest pain: {Blank single:19197::"yes","no"} Lower extremity edema: {Blank single:19197::"yes","no"} Dizzy/lightheaded: {Blank single:19197::"yes","no"}   HYPOTHYROIDISM Thyroid control status:{Blank single:19197::"controlled","uncontrolled","better","worse","exacerbated","stable"} Satisfied with current treatment? {Blank single:19197::"yes","no"} Medication side effects: {Blank single:19197::"yes","no"} Medication compliance: {Blank single:19197::"excellent compliance","good compliance","fair compliance","poor compliance"} Etiology of hypothyroidism:  Recent dose adjustment:{Blank single:19197::"yes","no"} Fatigue: {Blank single:19197::"yes","no"} Cold intolerance: {Blank single:19197::"yes","no"} Heat intolerance: {Blank single:19197::"yes","no"} Weight gain: {Blank single:19197::"yes","no"} Weight loss: {Blank single:19197::"yes","no"} Constipation: {Blank single:19197::"yes","no"} Diarrhea/loose stools: {Blank single:19197::"yes","no"} Palpitations: {Blank single:19197::"yes","no"} Lower extremity edema: {Blank single:19197::"yes","no"} Anxiety/depressed mood: {Blank single:19197::"yes","no"}   Depression Screen done today and results listed below:     09/04/2021   12:18 PM 02/20/2021   10:55 AM 01/23/2021   10:32 AM 11/10/2020   10:24 AM 10/09/2020    1:51 PM  Depression screen PHQ 2/9  Decreased Interest 0 $RemoveBe'3 3 1 'QhlLFcnTV$ 0  Down, Depressed, Hopeless 0 $RemoveBe'3 2 3 'VGRFpoKHa$ 0  PHQ - 2 Score 0 $Remov'6 5 4 'ANyEhW$ 0  Altered sleeping  $RemoveB'3 3 3   'bLKpeVnB$ Tired, decreased energy  $Remov'3 3 3   'PaNZGd$ Change in appetite  3 3 0   Feeling bad or failure about yourself   '3 3 2   '$ Trouble concentrating  3 1 0   Moving slowly or fidgety/restless  2 2 0   Suicidal thoughts  0 0  0   PHQ-9 Score  $Remo'23 20 12   'VzsLc$ Difficult doing work/chores  Extremely dIfficult Extremely dIfficult Somewhat difficult     The patient {has/does not NOIB:70488} a history of falls. I {did/did not:19850} complete a risk assessment for falls. A plan of care for falls {was/was not:19852} documented.   Past Medical History:  Past Medical History:  Diagnosis Date   Arthritis    Asthma    CHF (congestive heart failure) ([place])    Hypertension     Surgical History:  Past Surgical History:  Procedure Laterality Date   APPENDECTOMY     CHOLECYSTECTOMY     TUBAL LIGATION      Medications:  Current Outpatient Medications on File Prior to Visit  Medication Sig   albuterol (VENTOLIN HFA) 108 (90 Base) MCG/ACT inhaler Inhale 1-2 puffs into the lungs every 6 (six) hours as needed for wheezing or shortness of breath.   albuterol (VENTOLIN HFA) 108 (90 Base) MCG/ACT inhaler Inhale 1 puff into the lungs every 6 (six) hours as needed for wheezing or shortness of breath.   amLODipine (NORVASC) 10 MG tablet Take 1 tablet (10 mg total) by mouth daily.   atorvastatin (LIPITOR) 20 MG tablet  Take 1 tablet by mouth once daily   buPROPion (WELLBUTRIN XL) 150 MG 24 hr tablet Take 1 tablet by mouth once daily   famotidine (PEPCID) 20 MG tablet Take 1 tablet (20 mg total) by mouth at bedtime.   Homeopathic Products (RESTFUL LEGS SL) Place 1 tablet under the tongue at bedtime.   levothyroxine (SYNTHROID) 75 MCG tablet TAKE 1 TABLET BY MOUTH ONCE DAILY BEFORE BREAKFAST   nicotine (NICODERM CQ) 21 mg/24hr patch Place 1 patch (21 mg total) onto the skin daily.   nystatin cream (MYCOSTATIN) Apply topically 2 (two) times daily.   pramipexole (MIRAPEX) 0.25 MG tablet TAKE 1 TABLET BY MOUTH THREE TIMES DAILY   Tiotropium Bromide Monohydrate (SPIRIVA RESPIMAT) 2.5 MCG/ACT AERS Inhale 2 puffs into the lungs daily.   VITAMIN D, ERGOCALCIFEROL, PO Take 5,000 Int'l Units/day by mouth daily.   No current  facility-administered medications on file prior to visit.    Allergies:  Allergies  Allergen Reactions   Hydrochlorothiazide Other (See Comments)    Causes severe muscle cramps   Prochlorperazine Edisylate Anaphylaxis    coma   Contrast Media [Iodinated Contrast Media] Palpitations and Other (See Comments)    Patient had severe hypotension, rapid heart beat after initial contrast injection "years ago." For recent procedure using IV contrast patient had appropriate 13 hour premedication.     Social History:  Social History   Socioeconomic History   Marital status: Married    Spouse name: Not on file   Number of children: Not on file   Years of education: Not on file   Highest education level: Not on file  Occupational History   Not on file  Tobacco Use   Smoking status: Every Day    Packs/day: 0.50    Types: Cigarettes   Smokeless tobacco: Never  Vaping Use   Vaping Use: Never used  Substance and Sexual Activity   Alcohol use: Never   Drug use: Never   Sexual activity: Yes  Other Topics Concern   Not on file  Social History Narrative   Not on file   Social Determinants of Health   Financial Resource Strain: Low Risk  (09/04/2021)   Overall Financial Resource Strain (CARDIA)    Difficulty of Paying Living Expenses: Not very hard  Food Insecurity: No Food Insecurity (09/04/2021)   Hunger Vital Sign    Worried About Running Out of Food in the Last Year: Never true    Ran Out of Food in the Last Year: Never true  Transportation Needs: No Transportation Needs (09/04/2021)   PRAPARE - Hydrologist (Medical): No    Lack of Transportation (Non-Medical): No  Physical Activity: Sufficiently Active (09/04/2021)   Exercise Vital Sign    Days of Exercise per Week: 6 days    Minutes of Exercise per Session: 60 min  Stress: No Stress Concern Present (09/04/2021)   [place]    Feeling  of Stress : Only a little  Social Connections: Moderately Isolated (09/04/2021)   Social Connection and Isolation Panel [NHANES]    Frequency of Communication with Friends and Family: More than three times a week    Frequency of Social Gatherings with Friends and Family: More than three times a week    Attends Religious Services: Never    Marine scientist or Organizations: No    Attends Archivist Meetings: Never    Marital Status: Married  Intimate  Partner Violence: Not At Risk (09/04/2021)   Humiliation, Afraid, Rape, and Kick questionnaire    Fear of Current or Ex-Partner: No    Emotionally Abused: No    Physically Abused: No    Sexually Abused: No   Social History   Tobacco Use  Smoking Status Every Day   Packs/day: 0.50   Types: Cigarettes  Smokeless Tobacco Never   Social History   Substance and Sexual Activity  Alcohol Use Never    Family History:  Family History  Problem Relation Age of Onset   Thyroid disease Mother    Non-Hodgkin's lymphoma Mother    Lung cancer Mother    Thyroid cancer Mother    Esophageal cancer Mother    Stomach cancer Mother    Ovarian cancer Mother    Skin cancer Mother    Heart failure Mother    Heart disease Mother    Hypertension Mother    Stroke Mother    Cancer Mother    Arthritis Mother    Asthma Mother    COPD Mother    Early death Mother    Heart attack Father    Diabetes Father    Kidney disease Father    Heart disease Father    Hypertension Father    Heart failure Father    Thyroid disease Sister    Anxiety disorder Sister    Depression Sister    Panic disorder Sister    Diabetes Sister    Suicidality Sister    Early death Sister    Diabetes Brother    Other Brother        drug abuse   Heart disease Maternal Grandmother    Stroke Maternal Grandmother    Cancer Maternal Grandfather    Heart disease Maternal Grandfather    Stroke Maternal Grandfather    Alzheimer's disease Paternal Grandmother     Diabetes Paternal Grandmother    Hypertension Paternal Grandmother    Breast cancer Paternal Grandmother 58   Heart disease Paternal Grandfather    Breast cancer Maternal Aunt 60    Past medical history, surgical history, medications, allergies, family history and social history reviewed with patient today and changes made to appropriate areas of the chart.   ROS All other ROS negative except what is listed above and in the HPI.      Objective:    LMP  (LMP Unknown)   Wt Readings from Last 3 Encounters:  06/25/21 226 lb 9.6 oz (102.8 kg)  02/20/21 231 lb 9.6 oz (105.1 kg)  01/23/21 237 lb (107.5 kg)    Physical Exam  Results for orders placed or performed in visit on 06/25/21  CBC with Differential/Platelet  Result Value Ref Range   WBC 8.6 3.4 - 10.8 x10E3/uL   RBC 5.05 3.77 - 5.28 x10E6/uL   Hemoglobin 15.2 11.1 - 15.9 g/dL   Hematocrit 45.3 34.0 - 46.6 %   MCV 90 79 - 97 fL   MCH 30.1 26.6 - 33.0 pg   MCHC 33.6 31.5 - 35.7 g/dL   RDW 14.1 11.7 - 15.4 %   Platelets 305 150 - 450 x10E3/uL   Neutrophils 59 Not Estab. %   Lymphs 31 Not Estab. %   Monocytes 5 Not Estab. %   Eos 3 Not Estab. %   Basos 1 Not Estab. %   Neutrophils Absolute 5.2 1.4 - 7.0 x10E3/uL   Lymphocytes Absolute 2.7 0.7 - 3.1 x10E3/uL   Monocytes Absolute 0.4 0.1 - 0.9 x10E3/uL  EOS (ABSOLUTE) 0.2 0.0 - 0.4 x10E3/uL   Basophils Absolute 0.1 0.0 - 0.2 x10E3/uL   Immature Granulocytes 1 Not Estab. %   Immature Grans (Abs) 0.1 0.0 - 0.1 x10E3/uL  Comprehensive metabolic panel  Result Value Ref Range   Glucose 87 70 - 99 mg/dL   BUN 12 6 - 24 mg/dL   Creatinine, Ser 1.09 (H) 0.57 - 1.00 mg/dL   eGFR 60 >59 mL/min/1.73   BUN/Creatinine Ratio 11 9 - 23   Sodium 141 134 - 144 mmol/L   Potassium 4.6 3.5 - 5.2 mmol/L   Chloride 101 96 - 106 mmol/L   CO2 20 20 - 29 mmol/L   Calcium 9.9 8.7 - 10.2 mg/dL   Total Protein 7.3 6.0 - 8.5 g/dL   Albumin 4.3 3.8 - 4.9 g/dL   Globulin, Total 3.0 1.5 -  4.5 g/dL   Albumin/Globulin Ratio 1.4 1.2 - 2.2   Bilirubin Total <0.2 0.0 - 1.2 mg/dL   Alkaline Phosphatase 145 (H) 44 - 121 IU/L   AST 15 0 - 40 IU/L   ALT 23 0 - 32 IU/L  TSH  Result Value Ref Range   TSH 2.870 0.450 - 4.500 uIU/mL  Urinalysis, Routine w reflex microscopic  Result Value Ref Range   Specific Gravity, UA 1.020 1.005 - 1.030   pH, UA 6.0 5.0 - 7.5   Color, UA Yellow Yellow   Appearance Ur Clear Clear   Leukocytes,UA Negative Negative   Protein,UA Negative Negative/Trace   Glucose, UA Negative Negative   Ketones, UA Negative Negative   RBC, UA Negative Negative   Bilirubin, UA Negative Negative   Urobilinogen, Ur 0.2 0.2 - 1.0 mg/dL   Nitrite, UA Negative Negative  T4, free  Result Value Ref Range   Free T4 1.24 0.82 - 1.77 ng/dL      Assessment & Plan:   Problem List Items Addressed This Visit       Cardiovascular and Mediastinum   Primary hypertension - Primary     Endocrine   Hypothyroidism     Other   Depression, recurrent ([place])   Prediabetes     Follow up plan: No follow-ups on file.   LABORATORY TESTING:  - Pap smear: {Blank PZWCHE:52778::"EUM done","not applicable","up to date","done elsewhere"}  IMMUNIZATIONS:   - Tdap: Tetanus vaccination status reviewed: {tetanus status:315746}. - Influenza: {Blank single:19197::"Up to date","Administered today","Postponed to flu season","Refused","Given elsewhere"} - Pneumovax: {Blank single:19197::"Up to date","Administered today","Not applicable","Refused","Given elsewhere"} - Prevnar: {Blank single:19197::"Up to date","Administered today","Not applicable","Refused","Given elsewhere"} - COVID: {Blank single:19197::"Up to date","Administered today","Not applicable","Refused","Given elsewhere"} - HPV: {Blank single:19197::"Up to date","Administered today","Not applicable","Refused","Given elsewhere"} - Shingrix vaccine: {Blank single:19197::"Up to date","Administered today","Not  applicable","Refused","Given elsewhere"}  SCREENING: -Mammogram: {Blank single:19197::"Up to date","Ordered today","Not applicable","Refused","Done elsewhere"}  - Colonoscopy: {Blank single:19197::"Up to date","Ordered today","Not applicable","Refused","Done elsewhere"}  - Bone Density: {Blank single:19197::"Up to date","Ordered today","Not applicable","Refused","Done elsewhere"}  -Hearing Test: {Blank single:19197::"Up to date","Ordered today","Not applicable","Refused","Done elsewhere"}  -Spirometry: {Blank single:19197::"Up to date","Ordered today","Not applicable","Refused","Done elsewhere"}   PATIENT COUNSELING:   Advised to take 1 mg of folate supplement per day if capable of pregnancy.   Sexuality: Discussed sexually transmitted diseases, partner selection, use of condoms, avoidance of unintended pregnancy  and contraceptive alternatives.   Advised to avoid cigarette smoking.  I discussed with the patient that most people either abstain from alcohol or drink within safe limits (<=14/week and <=4 drinks/occasion for males, <=7/weeks and <= 3 drinks/occasion for females) and that the risk for alcohol disorders and other health  effects rises proportionally with the number of drinks per week and how often a drinker exceeds daily limits.  Discussed cessation/primary prevention of drug use and availability of treatment for abuse.   Diet: Encouraged to adjust caloric intake to maintain  or achieve ideal body weight, to reduce intake of dietary saturated fat and total fat, to limit sodium intake by avoiding high sodium foods and not adding table salt, and to maintain adequate dietary potassium and calcium preferably from fresh fruits, vegetables, and low-fat dairy products.    stressed the importance of regular exercise  Injury prevention: Discussed safety belts, safety helmets, smoke detector, smoking near bedding or upholstery.   Dental health: Discussed importance of regular tooth  brushing, flossing, and dental visits.    NEXT PREVENTATIVE PHYSICAL DUE IN 1 YEAR. No follow-ups on file.

## 2022-04-14 ENCOUNTER — Ambulatory Visit: Payer: Self-pay | Admitting: Nurse Practitioner

## 2022-04-27 DIAGNOSIS — S93325D Dislocation of tarsometatarsal joint of left foot, subsequent encounter: Secondary | ICD-10-CM | POA: Diagnosis not present

## 2022-04-28 ENCOUNTER — Encounter: Payer: Self-pay | Admitting: Nurse Practitioner

## 2022-04-28 ENCOUNTER — Ambulatory Visit (INDEPENDENT_AMBULATORY_CARE_PROVIDER_SITE_OTHER): Payer: 59 | Admitting: Nurse Practitioner

## 2022-04-28 VITALS — BP 126/82 | HR 86 | Temp 97.6°F | Ht 63.0 in | Wt 231.4 lb

## 2022-04-28 DIAGNOSIS — Z1231 Encounter for screening mammogram for malignant neoplasm of breast: Secondary | ICD-10-CM

## 2022-04-28 DIAGNOSIS — E039 Hypothyroidism, unspecified: Secondary | ICD-10-CM

## 2022-04-28 DIAGNOSIS — F339 Major depressive disorder, recurrent, unspecified: Secondary | ICD-10-CM | POA: Diagnosis not present

## 2022-04-28 DIAGNOSIS — Z23 Encounter for immunization: Secondary | ICD-10-CM | POA: Diagnosis not present

## 2022-04-28 DIAGNOSIS — Z720 Tobacco use: Secondary | ICD-10-CM

## 2022-04-28 DIAGNOSIS — Z0000 Encounter for general adult medical examination without abnormal findings: Secondary | ICD-10-CM | POA: Diagnosis not present

## 2022-04-28 DIAGNOSIS — Z114 Encounter for screening for human immunodeficiency virus [HIV]: Secondary | ICD-10-CM | POA: Diagnosis not present

## 2022-04-28 DIAGNOSIS — R69 Illness, unspecified: Secondary | ICD-10-CM | POA: Diagnosis not present

## 2022-04-28 DIAGNOSIS — I10 Essential (primary) hypertension: Secondary | ICD-10-CM

## 2022-04-28 DIAGNOSIS — E782 Mixed hyperlipidemia: Secondary | ICD-10-CM

## 2022-04-28 DIAGNOSIS — Z1159 Encounter for screening for other viral diseases: Secondary | ICD-10-CM

## 2022-04-28 DIAGNOSIS — Z1211 Encounter for screening for malignant neoplasm of colon: Secondary | ICD-10-CM

## 2022-04-28 DIAGNOSIS — R7303 Prediabetes: Secondary | ICD-10-CM | POA: Diagnosis not present

## 2022-04-28 DIAGNOSIS — I509 Heart failure, unspecified: Secondary | ICD-10-CM

## 2022-04-28 LAB — MICROSCOPIC EXAMINATION: Bacteria, UA: NONE SEEN

## 2022-04-28 LAB — URINALYSIS, ROUTINE W REFLEX MICROSCOPIC
Bilirubin, UA: NEGATIVE
Glucose, UA: NEGATIVE
Ketones, UA: NEGATIVE
Leukocytes,UA: NEGATIVE
Nitrite, UA: NEGATIVE
Protein,UA: NEGATIVE
Specific Gravity, UA: 1.025 (ref 1.005–1.030)
Urobilinogen, Ur: 1 mg/dL (ref 0.2–1.0)
pH, UA: 6 (ref 5.0–7.5)

## 2022-04-28 MED ORDER — BUPROPION HCL ER (XL) 150 MG PO TB24: 150.0000 mg | ORAL_TABLET | Freq: Every day | ORAL | 1 refills | Status: AC

## 2022-04-28 MED ORDER — ALBUTEROL SULFATE HFA 108 (90 BASE) MCG/ACT IN AERS: 1.0000 | INHALATION_SPRAY | Freq: Four times a day (QID) | RESPIRATORY_TRACT | 1 refills | Status: DC | PRN

## 2022-04-28 MED ORDER — LEVOTHYROXINE SODIUM 75 MCG PO TABS: 75.0000 ug | ORAL_TABLET | Freq: Every day | ORAL | 1 refills | Status: DC

## 2022-04-28 MED ORDER — PRAMIPEXOLE DIHYDROCHLORIDE 0.25 MG PO TABS: 0.2500 mg | ORAL_TABLET | Freq: Three times a day (TID) | ORAL | 1 refills | Status: DC

## 2022-04-28 MED ORDER — ATORVASTATIN CALCIUM 20 MG PO TABS: 20.0000 mg | ORAL_TABLET | Freq: Every day | ORAL | 1 refills | Status: DC

## 2022-04-28 NOTE — Assessment & Plan Note (Signed)
Labs ordered at visit today.  Will make recommendations based on lab results.   

## 2022-04-28 NOTE — Assessment & Plan Note (Signed)
Chronic.  Controlled without medication.  Will continue without medication at this time.  If needed can restart Amlodipine.  Labs ordered today.  Return to clinic in 6 months for reevaluation.  Call sooner if concerns arise.

## 2022-04-28 NOTE — Assessment & Plan Note (Signed)
Chronic. Has been out of medication for 3-4 months.  Labs ordered today. Will restart Levothyroxine 40mcg daily.  Follow up in 6 months.  Call sooner if concerns arise.

## 2022-04-28 NOTE — Assessment & Plan Note (Signed)
Chronic.  Controlled.  Continue with current medication regimen of atorvastatin 20mg  daily.  Refill sent today.  Labs ordered today.  Return to clinic in 6 months for reevaluation.  Call sooner if concerns arise.

## 2022-04-28 NOTE — Assessment & Plan Note (Signed)
Chronic. Not well controlled. Will restart Wellbutrin 150mg  daily.  Labs ordered.  Follow up in 6 months. Call sooner if concerns arise.

## 2022-04-28 NOTE — Assessment & Plan Note (Signed)
Recommended eating smaller high protein, low fat meals more frequently and exercising 30 mins a day 5 times a week with a goal of 10-15lb weight loss in the next 3 months.  

## 2022-04-28 NOTE — Progress Notes (Signed)
BP 126/82   Pulse 86   Temp 97.6 F (36.4 C) (Oral)   Ht 5\' 3"  (1.6 m)   Wt 231 lb 6.4 oz (105 kg)   LMP  (LMP Unknown)   SpO2 98%   BMI 40.99 kg/m    Subjective:    Patient ID: Jodi Keith, female    DOB: 06-15-1965, 57 y.o.   MRN: [id number]  HPI: Jodi Keith is a 57 y.o. female presenting on 04/28/2022 for comprehensive medical examination. Current medical complaints include:none  She currently lives with: Menopausal Symptoms: no  HYPERTENSION / HYPERLIPIDEMIA Satisfied with current treatment? yes Duration of hypertension: years BP monitoring frequency: daily BP range: 120/70 BP medication side effects: no Past BP meds: amlodipine Duration of hyperlipidemia: years Cholesterol medication side effects: no Cholesterol supplements: none Past cholesterol medications: atorvastain (lipitor) Medication compliance: excellent compliance Aspirin: no Recent stressors: no Recurrent headaches: no Visual changes: no Palpitations: no Dyspnea: no Chest pain: no Lower extremity edema: no Dizzy/lightheaded: no  HYPOTHYROIDISM Has been out of medication for 3-4 months. Thyroid control status:uncontrolled Satisfied with current treatment? yes Medication side effects: no Medication compliance: excellent compliance Etiology of hypothyroidism:  Recent dose adjustment:no Fatigue: yes Cold intolerance: yes Heat intolerance: no Weight gain: no Weight loss: no Constipation: no Diarrhea/loose stools: no Palpitations: no Lower extremity edema: no Anxiety/depressed mood: yes  MOOD Patient states she has been more short tempered.  Feels like she is constantly worrying about things. Has been out of medication for about 3-4 months.  Okay with restarting medication.   Depression Screen done today and results listed below:     04/28/2022    1:18 PM 09/04/2021   12:18 PM 02/20/2021   10:55 AM 01/23/2021   10:32 AM 11/10/2020   10:24 AM  Depression screen PHQ 2/9   Decreased Interest 2 0 3 3 1   Down, Depressed, Hopeless 2 0 3 2 3   PHQ - 2 Score 4 0 6 5 4   Altered sleeping 3  3 3 3   Tired, decreased energy 2  3 3 3   Change in appetite 0  3 3 0  Feeling bad or failure about yourself  2  3 3 2   Trouble concentrating 0  3 1 0  Moving slowly or fidgety/restless 0  2 2 0  Suicidal thoughts 0  0 0 0  PHQ-9 Score 11  23 20 12   Difficult doing work/chores Somewhat difficult  Extremely dIfficult Extremely dIfficult Somewhat difficult    The patient does not have a history of falls. I did complete a risk assessment for falls. A plan of care for falls was documented.   Past Medical History:  Past Medical History:  Diagnosis Date   Arthritis    Asthma    CHF (congestive heart failure) ([place])    Hypertension     Surgical History:  Past Surgical History:  Procedure Laterality Date   APPENDECTOMY     CHOLECYSTECTOMY     TUBAL LIGATION      Medications:  Current Outpatient Medications on File Prior to Visit  Medication Sig   famotidine (PEPCID) 20 MG tablet Take 1 tablet (20 mg total) by mouth at bedtime.   Homeopathic Products (RESTFUL LEGS SL) Place 1 tablet under the tongue at bedtime.   Tiotropium Bromide Monohydrate (SPIRIVA RESPIMAT) 2.5 MCG/ACT AERS Inhale 2 puffs into the lungs daily.   VITAMIN D, ERGOCALCIFEROL, PO Take 5,000 Int'l Units/day by mouth daily.   No current facility-administered medications on  file prior to visit.    Allergies:  Allergies  Allergen Reactions   Hydrochlorothiazide Other (See Comments)    Causes severe muscle cramps   Prochlorperazine Edisylate Anaphylaxis    coma   Contrast Media [Iodinated Contrast Media] Palpitations and Other (See Comments)    Patient had severe hypotension, rapid heart beat after initial contrast injection "years ago." For recent procedure using IV contrast patient had appropriate 13 hour premedication.     Social History:  Social History   Socioeconomic History   Marital  status: Married    Spouse name: Not on file   Number of children: Not on file   Years of education: Not on file   Highest education level: Not on file  Occupational History   Not on file  Tobacco Use   Smoking status: Every Day    Packs/day: 0.50    Types: Cigarettes   Smokeless tobacco: Never  Vaping Use   Vaping Use: Never used  Substance and Sexual Activity   Alcohol use: Never   Drug use: Never   Sexual activity: Yes  Other Topics Concern   Not on file  Social History Narrative   Not on file   Social Determinants of Health   Financial Resource Strain: Low Risk  (09/04/2021)   Overall Financial Resource Strain (CARDIA)    Difficulty of Paying Living Expenses: Not very hard  Food Insecurity: No Food Insecurity (09/04/2021)   Hunger Vital Sign    Worried About Running Out of Food in the Last Year: Never true    Ran Out of Food in the Last Year: Never true  Transportation Needs: No Transportation Needs (09/04/2021)   PRAPARE - Hydrologist (Medical): No    Lack of Transportation (Non-Medical): No  Physical Activity: Sufficiently Active (09/04/2021)   Exercise Vital Sign    Days of Exercise per Week: 6 days    Minutes of Exercise per Session: 60 min  Stress: No Stress Concern Present (09/04/2021)   [place]    Feeling of Stress : Only a little  Social Connections: Moderately Isolated (09/04/2021)   Social Connection and Isolation Panel [NHANES]    Frequency of Communication with Friends and Family: More than three times a week    Frequency of Social Gatherings with Friends and Family: More than three times a week    Attends Religious Services: Never    Marine scientist or Organizations: No    Attends Archivist Meetings: Never    Marital Status: Married  Human resources officer Violence: Not At Risk (09/04/2021)   Humiliation, Afraid, Rape, and Kick questionnaire     Fear of Current or Ex-Partner: No    Emotionally Abused: No    Physically Abused: No    Sexually Abused: No   Social History   Tobacco Use  Smoking Status Every Day   Packs/day: 0.50   Types: Cigarettes  Smokeless Tobacco Never   Social History   Substance and Sexual Activity  Alcohol Use Never    Family History:  Family History  Problem Relation Age of Onset   Thyroid disease Mother    Non-Hodgkin's lymphoma Mother    Lung cancer Mother    Thyroid cancer Mother    Esophageal cancer Mother    Stomach cancer Mother    Ovarian cancer Mother    Skin cancer Mother    Heart failure Mother    Heart disease Mother  Hypertension Mother    Stroke Mother    Cancer Mother    Arthritis Mother    Asthma Mother    COPD Mother    Early death Mother    Heart attack Father    Diabetes Father    Kidney disease Father    Heart disease Father    Hypertension Father    Heart failure Father    Thyroid disease Sister    Anxiety disorder Sister    Depression Sister    Panic disorder Sister    Diabetes Sister    Suicidality Sister    Early death Sister    Diabetes Brother    Other Brother        drug abuse   Heart disease Maternal Grandmother    Stroke Maternal Grandmother    Cancer Maternal Grandfather    Heart disease Maternal Grandfather    Stroke Maternal Grandfather    Alzheimer's disease Paternal Grandmother    Diabetes Paternal Grandmother    Hypertension Paternal Grandmother    Breast cancer Paternal Grandmother 40   Heart disease Paternal Grandfather    Breast cancer Maternal Aunt 60    Past medical history, surgical history, medications, allergies, family history and social history reviewed with patient today and changes made to appropriate areas of the chart.   Review of Systems  Constitutional:  Positive for malaise/fatigue.  Eyes:  Negative for blurred vision and double vision.  Respiratory:  Negative for shortness of breath.   Cardiovascular:  Negative  for chest pain, palpitations and leg swelling.  Gastrointestinal:  Negative for constipation and diarrhea.  Neurological:  Negative for dizziness and headaches.  Psychiatric/Behavioral:  Positive for depression. Negative for suicidal ideas. The patient is nervous/anxious.    All other ROS negative except what is listed above and in the HPI.      Objective:    BP 126/82   Pulse 86   Temp 97.6 F (36.4 C) (Oral)   Ht 5\' 3"  (1.6 m)   Wt 231 lb 6.4 oz (105 kg)   LMP  (LMP Unknown)   SpO2 98%   BMI 40.99 kg/m   Wt Readings from Last 3 Encounters:  04/28/22 231 lb 6.4 oz (105 kg)  06/25/21 226 lb 9.6 oz (102.8 kg)  02/20/21 231 lb 9.6 oz (105.1 kg)    Physical Exam Vitals and nursing note reviewed.  Constitutional:      General: She is awake. She is not in acute distress.    Appearance: Normal appearance. She is well-developed. She is not ill-appearing.  HENT:     Head: Normocephalic and atraumatic.     Right Ear: Hearing, tympanic membrane, ear canal and external ear normal. No drainage.     Left Ear: Hearing, tympanic membrane, ear canal and external ear normal. No drainage.     Nose: Nose normal.     Right Sinus: No maxillary sinus tenderness or frontal sinus tenderness.     Left Sinus: No maxillary sinus tenderness or frontal sinus tenderness.     Mouth/Throat:     Mouth: Mucous membranes are moist.     Pharynx: Oropharynx is clear. Uvula midline. No pharyngeal swelling, oropharyngeal exudate or posterior oropharyngeal erythema.  Eyes:     General: Lids are normal.        Right eye: No discharge.        Left eye: No discharge.     Extraocular Movements: Extraocular movements intact.     Conjunctiva/sclera: Conjunctivae normal.  Pupils: Pupils are equal, round, and reactive to light.     Visual Fields: Right eye visual fields normal and left eye visual fields normal.  Neck:     Thyroid: No thyromegaly.     Vascular: No carotid bruit.     Trachea: Trachea normal.   Cardiovascular:     Rate and Rhythm: Normal rate and regular rhythm.     Heart sounds: Normal heart sounds. No murmur heard.    No gallop.  Pulmonary:     Effort: Pulmonary effort is normal. No accessory muscle usage or respiratory distress.     Breath sounds: Normal breath sounds.  Chest:  Breasts:    Right: Normal.     Left: Normal.  Abdominal:     General: Bowel sounds are normal.     Palpations: Abdomen is soft. There is no hepatomegaly or splenomegaly.     Tenderness: There is no abdominal tenderness.  Musculoskeletal:        General: Normal range of motion.     Cervical back: Normal range of motion and neck supple.     Right lower leg: No edema.     Left lower leg: No edema.  Lymphadenopathy:     Head:     Right side of head: No submental, submandibular, tonsillar, preauricular or posterior auricular adenopathy.     Left side of head: No submental, submandibular, tonsillar, preauricular or posterior auricular adenopathy.     Cervical: No cervical adenopathy.     Upper Body:     Right upper body: No supraclavicular, axillary or pectoral adenopathy.     Left upper body: No supraclavicular, axillary or pectoral adenopathy.  Skin:    General: Skin is warm and dry.     Capillary Refill: Capillary refill takes less than 2 seconds.     Findings: No rash.  Neurological:     Mental Status: She is alert and oriented to person, place, and time.     Gait: Gait is intact.  Psychiatric:        Attention and Perception: Attention normal.        Mood and Affect: Mood normal.        Speech: Speech normal.        Behavior: Behavior normal. Behavior is cooperative.        Thought Content: Thought content normal.        Judgment: Judgment normal.     Results for orders placed or performed in visit on 06/25/21  CBC with Differential/Platelet  Result Value Ref Range   WBC 8.6 3.4 - 10.8 x10E3/uL   RBC 5.05 3.77 - 5.28 x10E6/uL   Hemoglobin 15.2 11.1 - 15.9 g/dL   Hematocrit 45.3  34.0 - 46.6 %   MCV 90 79 - 97 fL   MCH 30.1 26.6 - 33.0 pg   MCHC 33.6 31.5 - 35.7 g/dL   RDW 14.1 11.7 - 15.4 %   Platelets 305 150 - 450 x10E3/uL   Neutrophils 59 Not Estab. %   Lymphs 31 Not Estab. %   Monocytes 5 Not Estab. %   Eos 3 Not Estab. %   Basos 1 Not Estab. %   Neutrophils Absolute 5.2 1.4 - 7.0 x10E3/uL   Lymphocytes Absolute 2.7 0.7 - 3.1 x10E3/uL   Monocytes Absolute 0.4 0.1 - 0.9 x10E3/uL   EOS (ABSOLUTE) 0.2 0.0 - 0.4 x10E3/uL   Basophils Absolute 0.1 0.0 - 0.2 x10E3/uL   Immature Granulocytes 1 Not Estab. %   Immature  Grans (Abs) 0.1 0.0 - 0.1 x10E3/uL  Comprehensive metabolic panel  Result Value Ref Range   Glucose 87 70 - 99 mg/dL   BUN 12 6 - 24 mg/dL   Creatinine, Ser 8.18 (H) 0.57 - 1.00 mg/dL   eGFR 60 >29 HB/ZJI/9.67   BUN/Creatinine Ratio 11 9 - 23   Sodium 141 134 - 144 mmol/L   Potassium 4.6 3.5 - 5.2 mmol/L   Chloride 101 96 - 106 mmol/L   CO2 20 20 - 29 mmol/L   Calcium 9.9 8.7 - 10.2 mg/dL   Total Protein 7.3 6.0 - 8.5 g/dL   Albumin 4.3 3.8 - 4.9 g/dL   Globulin, Total 3.0 1.5 - 4.5 g/dL   Albumin/Globulin Ratio 1.4 1.2 - 2.2   Bilirubin Total <0.2 0.0 - 1.2 mg/dL   Alkaline Phosphatase 145 (H) 44 - 121 IU/L   AST 15 0 - 40 IU/L   ALT 23 0 - 32 IU/L  TSH  Result Value Ref Range   TSH 2.870 0.450 - 4.500 uIU/mL  Urinalysis, Routine w reflex microscopic  Result Value Ref Range   Specific Gravity, UA 1.020 1.005 - 1.030   pH, UA 6.0 5.0 - 7.5   Color, UA Yellow Yellow   Appearance Ur Clear Clear   Leukocytes,UA Negative Negative   Protein,UA Negative Negative/Trace   Glucose, UA Negative Negative   Ketones, UA Negative Negative   RBC, UA Negative Negative   Bilirubin, UA Negative Negative   Urobilinogen, Ur 0.2 0.2 - 1.0 mg/dL   Nitrite, UA Negative Negative  T4, free  Result Value Ref Range   Free T4 1.24 0.82 - 1.77 ng/dL      Assessment & Plan:   Problem List Items Addressed This Visit       Cardiovascular and  Mediastinum   Primary hypertension    Chronic.  Controlled without medication.  Will continue without medication at this time.  If needed can restart Amlodipine.  Labs ordered today.  Return to clinic in 6 months for reevaluation.  Call sooner if concerns arise.        Relevant Medications   atorvastatin (LIPITOR) 20 MG tablet   Congestive heart failure, unspecified HF chronicity, unspecified heart failure type (HCC)    Chronic.  Controlled.  Continue with current medication regimen.  Labs ordered today.  Return to clinic in 6 months for reevaluation.  Call sooner if concerns arise.        Relevant Medications   atorvastatin (LIPITOR) 20 MG tablet     Endocrine   Hypothyroidism    Chronic. Has been out of medication for 3-4 months.  Labs ordered today. Will restart Levothyroxine daily.  Follow up in 6 months.  Call sooner if concerns arise.       Relevant Medications   levothyroxine (SYNTHROID) 75 MCG tablet   Other Relevant Orders   T4, free     Other   Depression, recurrent (HCC)    Chronic. Not well controlled. Will restart Wellbutrin 150mg  daily.  Labs ordered.  Follow up in 6 months. Call sooner if concerns arise.       Relevant Medications   buPROPion (WELLBUTRIN XL) 150 MG 24 hr tablet   Prediabetes    Labs ordered at visit today.  Will make recommendations based on lab results.        Relevant Orders   HgB A1c   Mixed hyperlipidemia    Chronic.  Controlled.  Continue with current medication regimen  of atorvastatin 20mg  daily.  Refill sent today.  Labs ordered today.  Return to clinic in 6 months for reevaluation.  Call sooner if concerns arise.        Relevant Medications   atorvastatin (LIPITOR) 20 MG tablet   Other Relevant Orders   Lipid panel   Morbid obesity ([place])    Recommended eating smaller high protein, low fat meals more frequently and exercising 30 mins a day 5 times a week with a goal of 10-15lb weight loss in the next 3 months.        Other Visit Diagnoses     Annual physical exam    -  Primary   Health maintenance reviewed during visit today.  Labs ordered.  TDAP and Flu shot given. Cologuard and Mammogram ordered.  CT Lung ordered.   Relevant Orders   CBC with Differential/Platelet   Comprehensive metabolic panel   TSH   Urinalysis, Routine w reflex microscopic   HgB A1c   Tobacco use       CT lung screening ordered.   Relevant Orders   CT CHEST LUNG CA SCREEN LOW DOSE W/O CM   Screening for colon cancer       Relevant Orders   Cologuard   Encounter for screening mammogram for malignant neoplasm of breast       Relevant Orders   MM 3D SCREEN BREAST BILATERAL   Need for influenza vaccination       Relevant Orders   Flu Vaccine QUAD 52mo+IM (Fluarix, Fluzone & Alfiuria Quad PF)   Need for tetanus booster       Relevant Orders   Tdap vaccine greater than or equal to 7yo IM   Encounter for hepatitis C screening test for low risk patient       Relevant Orders   Hepatitis C Antibody   Screening for HIV (human immunodeficiency virus)       Relevant Orders   HIV Antibody (routine testing w rflx)        Follow up plan: Return in about 5 months (around 09/27/2022) for HTN, HLD, DM2 FU.   LABORATORY TESTING:  - Pap smear: up to date  IMMUNIZATIONS:   - Tdap: Tetanus vaccination status reviewed: last tetanus booster within 10 years. - Influenza: Administered today - Pneumovax: Will give at next visit - Prevnar: Not applicable - COVID: Not applicable - HPV: Not applicable - Shingrix vaccine:  Discussed at visit today  SCREENING: -Mammogram: Up to date  - Colonoscopy: Ordered today  - Bone Density: Not applicable  -Hearing Test: Not applicable  -Spirometry: Not applicable   PATIENT COUNSELING:   Advised to take 1 mg of folate supplement per day if capable of pregnancy.   Sexuality: Discussed sexually transmitted diseases, partner selection, use of condoms, avoidance of unintended pregnancy  and  contraceptive alternatives.   Advised to avoid cigarette smoking.  I discussed with the patient that most people either abstain from alcohol or drink within safe limits (<=14/week and <=4 drinks/occasion for males, <=7/weeks and <= 3 drinks/occasion for females) and that the risk for alcohol disorders and other health effects rises proportionally with the number of drinks per week and how often a drinker exceeds daily limits.  Discussed cessation/primary prevention of drug use and availability of treatment for abuse.   Diet: Encouraged to adjust caloric intake to maintain  or achieve ideal body weight, to reduce intake of dietary saturated fat and total fat, to limit sodium intake by avoiding high sodium  foods and not adding table salt, and to maintain adequate dietary potassium and calcium preferably from fresh fruits, vegetables, and low-fat dairy products.    stressed the importance of regular exercise  Injury prevention: Discussed safety belts, safety helmets, smoke detector, smoking near bedding or upholstery.   Dental health: Discussed importance of regular tooth brushing, flossing, and dental visits.    NEXT PREVENTATIVE PHYSICAL DUE IN 1 YEAR. Return in about 5 months (around 09/27/2022) for HTN, HLD, DM2 FU.

## 2022-04-28 NOTE — Assessment & Plan Note (Signed)
Chronic.  Controlled.  Continue with current medication regimen.  Labs ordered today.  Return to clinic in 6 months for reevaluation.  Call sooner if concerns arise.  ? ?

## 2022-04-29 LAB — CBC WITH DIFFERENTIAL/PLATELET
Basophils Absolute: 0.1 10*3/uL (ref 0.0–0.2)
Basos: 1 %
EOS (ABSOLUTE): 0.2 10*3/uL (ref 0.0–0.4)
Eos: 3 %
Hematocrit: 44.2 % (ref 34.0–46.6)
Hemoglobin: 14.9 g/dL (ref 11.1–15.9)
Immature Grans (Abs): 0.1 10*3/uL (ref 0.0–0.1)
Immature Granulocytes: 1 %
Lymphocytes Absolute: 2.5 10*3/uL (ref 0.7–3.1)
Lymphs: 31 %
MCH: 31 pg (ref 26.6–33.0)
MCHC: 33.7 g/dL (ref 31.5–35.7)
MCV: 92 fL (ref 79–97)
Monocytes Absolute: 0.4 10*3/uL (ref 0.1–0.9)
Monocytes: 5 %
Neutrophils Absolute: 4.8 10*3/uL (ref 1.4–7.0)
Neutrophils: 59 %
Platelets: 337 10*3/uL (ref 150–450)
RBC: 4.81 x10E6/uL (ref 3.77–5.28)
RDW: 13.8 % (ref 11.7–15.4)
WBC: 8 10*3/uL (ref 3.4–10.8)

## 2022-04-29 LAB — COMPREHENSIVE METABOLIC PANEL
ALT: 31 IU/L (ref 0–32)
AST: 24 IU/L (ref 0–40)
Albumin/Globulin Ratio: 1.5 (ref 1.2–2.2)
Albumin: 4 g/dL (ref 3.8–4.9)
Alkaline Phosphatase: 145 IU/L — ABNORMAL HIGH (ref 44–121)
BUN/Creatinine Ratio: 8 — ABNORMAL LOW (ref 9–23)
BUN: 9 mg/dL (ref 6–24)
Bilirubin Total: 0.2 mg/dL (ref 0.0–1.2)
CO2: 21 mmol/L (ref 20–29)
Calcium: 9.6 mg/dL (ref 8.7–10.2)
Chloride: 100 mmol/L (ref 96–106)
Creatinine, Ser: 1.2 mg/dL — ABNORMAL HIGH (ref 0.57–1.00)
Globulin, Total: 2.7 g/dL (ref 1.5–4.5)
Glucose: 128 mg/dL — ABNORMAL HIGH (ref 70–99)
Potassium: 4.3 mmol/L (ref 3.5–5.2)
Sodium: 140 mmol/L (ref 134–144)
Total Protein: 6.7 g/dL (ref 6.0–8.5)
eGFR: 53 mL/min/{1.73_m2} — ABNORMAL LOW (ref >59)

## 2022-04-29 LAB — HEMOGLOBIN A1C
Est. average glucose Bld gHb Est-mCnc: 126 mg/dL
Hgb A1c MFr Bld: 6 % — ABNORMAL HIGH (ref 4.8–5.6)

## 2022-04-29 LAB — LIPID PANEL
Chol/HDL Ratio: 5.2 ratio — ABNORMAL HIGH (ref 0.0–4.4)
Cholesterol, Total: 188 mg/dL (ref 100–199)
HDL: 36 mg/dL — ABNORMAL LOW (ref >39)
LDL Chol Calc (NIH): 78 mg/dL (ref 0–99)
Triglycerides: 467 mg/dL — ABNORMAL HIGH (ref 0–149)
VLDL Cholesterol Cal: 74 mg/dL — ABNORMAL HIGH (ref 5–40)

## 2022-04-29 LAB — HEPATITIS C ANTIBODY: Hep C Virus Ab: NONREACTIVE

## 2022-04-29 LAB — T4, FREE: Free T4: 1.15 ng/dL (ref 0.82–1.77)

## 2022-04-29 LAB — HIV ANTIBODY (ROUTINE TESTING W REFLEX): HIV Screen 4th Generation wRfx: NONREACTIVE

## 2022-04-29 LAB — TSH: TSH: 4.46 u[IU]/mL (ref 0.450–4.500)

## 2022-04-29 NOTE — Progress Notes (Signed)
HI Annais. It was good to see you yesterday.  Your lab work shows that your kidney function is stable but it is at stage 3 chronic kidney disease.  Triglycerides are elevated from prior but I recommend decreasing processed foods and refined sugar.  Your A1c is in the prediabetic range. Make sure to follow a low carb diet.  No other concerns at this time.  Your other lab work looks good.  See you at our next visit.  We don't need to recheck your thyroid labs until your next visit.

## 2022-05-05 DIAGNOSIS — Z1211 Encounter for screening for malignant neoplasm of colon: Secondary | ICD-10-CM | POA: Diagnosis not present

## 2022-05-06 ENCOUNTER — Encounter: Payer: Self-pay | Admitting: Nurse Practitioner

## 2022-05-14 LAB — COLOGUARD: COLOGUARD: NEGATIVE

## 2022-05-17 NOTE — Progress Notes (Signed)
Hi Jodi Keith. Your Cologuard was negative. We will repeat it in 3 years.

## 2022-05-20 ENCOUNTER — Telehealth: Payer: Self-pay

## 2022-05-20 NOTE — Telephone Encounter (Signed)
-----  Message from Georgina Peer, [place] sent at 04/28/2022  1:15 PM EST ----- Schedule mammogram- morning appointment

## 2022-05-20 NOTE — Telephone Encounter (Signed)
Called and scheduled mammogram for 06/11/22 at 9:00 AM at [facility] in [place].   Tried calling patient to give appointment date and time, LVM to return my call.   OK for P[facility] to speak to patient and give appointment date and time if the patient calls back.

## 2022-05-24 NOTE — Telephone Encounter (Signed)
Called and LVM asking for patient to please return my call.  

## 2022-06-28 DIAGNOSIS — H524 Presbyopia: Secondary | ICD-10-CM | POA: Diagnosis not present

## 2022-07-05 ENCOUNTER — Encounter: Payer: Self-pay | Admitting: Nurse Practitioner

## 2022-07-07 NOTE — Telephone Encounter (Signed)
Spoke to provider about patient and advised pt to go to the ER.  I asked her was she going to go and she stated to me, "No, there is no way I can.  I have a small child here.  If I go there it will be a 7 or 8 hour wait, but if I start to feel any worse I will call the ambulance."  I did ask her if she wanted to schedule the appointment for the opening we had tomorrow at 10 and she said, "Yes."  But she still stated yet again that she would call 911 if she starts to feel really bad.

## 2022-07-07 NOTE — Telephone Encounter (Signed)
Patient called in with worsening SOB and neck pain down her arm.  Instructed Heather to tell the patient to go to the ER due to worsening symptoms.

## 2022-07-08 ENCOUNTER — Encounter: Payer: Self-pay | Admitting: Nurse Practitioner

## 2022-07-08 ENCOUNTER — Ambulatory Visit (INDEPENDENT_AMBULATORY_CARE_PROVIDER_SITE_OTHER): Payer: 59 | Admitting: Nurse Practitioner

## 2022-07-08 VITALS — BP 134/81 | HR 76 | Temp 97.8°F | Wt 232.0 lb

## 2022-07-08 DIAGNOSIS — G43119 Migraine with aura, intractable, without status migrainosus: Secondary | ICD-10-CM

## 2022-07-08 MED ORDER — TOPIRAMATE 25 MG PO TABS: 25.0000 mg | ORAL_TABLET | Freq: Two times a day (BID) | ORAL | 0 refills | Status: DC

## 2022-07-08 MED ORDER — SPIRIVA RESPIMAT 2.5 MCG/ACT IN AERS: 2.0000 | INHALATION_SPRAY | Freq: Every day | RESPIRATORY_TRACT | 5 refills | Status: AC

## 2022-07-08 MED ORDER — SUMATRIPTAN SUCCINATE 50 MG PO TABS: 50.0000 mg | ORAL_TABLET | ORAL | 0 refills | Status: AC | PRN

## 2022-07-08 NOTE — Progress Notes (Signed)
BP 134/81   Pulse 76   Temp 97.8 F (36.6 C) (Oral)   Wt 232 lb (105.2 kg)   LMP  (LMP Unknown)   SpO2 98%   BMI 41.10 kg/m    Subjective:    Patient ID: Jodi Keith, female    DOB: 1965-04-21, 57 y.o.   MRN: [id number]  HPI: Jodi Keith is a 57 y.o. female  Chief Complaint  Patient presents with   Headache    Pt states she has been having a on the back side of her head. States when she feels the headache, she also feels a pain running down the L side of her neck and into her chest. States she also breaks into a sweat, cries, and starts shaking when she gets the headache. States this has been going on for about 3 weeks now, off and on. States she takes ibuprofen, eases the pain. Then takes extra strength tylenol about 2 hours later. States she does have a hx of migraines and states this is what it felt like.     Patient states her headaches are on and off for about 3 weeks.  She has been taking Ibuprofen and then 2-3 hours later she takes tylenol and then has to repeat the medications later in the day because the headache returns.  Sometimes she is breaking out in a sweat, crying and shaking- these symptoms last for a few minutes.  When the headache comes on she see squiggly lines.  She is having the headaches several times weekly.          Relevant past medical, surgical, family and social history reviewed and updated as indicated. Interim medical history since our last visit reviewed. Allergies and medications reviewed and updated.  Review of Systems  Eyes:  Positive for photophobia and visual disturbance.  Musculoskeletal:  Positive for neck pain.  Neurological:  Positive for headaches.    Per HPI unless specifically indicated above     Objective:    BP 134/81   Pulse 76   Temp 97.8 F (36.6 C) (Oral)   Wt 232 lb (105.2 kg)   LMP  (LMP Unknown)   SpO2 98%   BMI 41.10 kg/m   Wt Readings from Last 3 Encounters:  07/08/22 232 lb (105.2 kg)   04/28/22 231 lb 6.4 oz (105 kg)  06/25/21 226 lb 9.6 oz (102.8 kg)    Physical Exam Vitals and nursing note reviewed.  Constitutional:      General: She is not in acute distress.    Appearance: Normal appearance. She is normal weight. She is not ill-appearing, toxic-appearing or diaphoretic.  HENT:     Head: Normocephalic.     Right Ear: External ear normal.     Left Ear: External ear normal.     Nose: Nose normal.     Mouth/Throat:     Mouth: Mucous membranes are moist.     Pharynx: Oropharynx is clear.  Eyes:     General:        Right eye: No discharge.        Left eye: No discharge.     Extraocular Movements: Extraocular movements intact.     Right eye: Normal extraocular motion and no nystagmus.     Left eye: Normal extraocular motion and no nystagmus.     Conjunctiva/sclera: Conjunctivae normal.     Pupils: Pupils are equal, round, and reactive to light.  Cardiovascular:     Rate and Rhythm: Normal  rate and regular rhythm.     Heart sounds: No murmur heard. Pulmonary:     Effort: Pulmonary effort is normal. No respiratory distress.     Breath sounds: Normal breath sounds. No wheezing or rales.  Musculoskeletal:     Cervical back: Normal range of motion and neck supple.  Skin:    General: Skin is warm and dry.     Capillary Refill: Capillary refill takes less than 2 seconds.  Neurological:     General: No focal deficit present.     Mental Status: She is alert and oriented to person, place, and time. Mental status is at baseline.     Cranial Nerves: Cranial nerves 2-12 are intact.     Sensory: Sensation is intact.     Motor: Motor function is intact.     Coordination: Coordination is intact.     Gait: Gait is intact.  Psychiatric:        Mood and Affect: Mood normal.        Behavior: Behavior normal.        Thought Content: Thought content normal.        Judgment: Judgment normal.     Results for orders placed or performed in visit on 04/28/22  Microscopic  Examination   Urine  Result Value Ref Range   WBC, UA 0-5 0 - 5 /hpf   RBC, Urine 0-2 0 - 2 /hpf   Epithelial Cells (non renal) 0-10 0 - 10 /hpf   Bacteria, UA None seen None seen/Few  CBC with Differential/Platelet  Result Value Ref Range   WBC 8.0 3.4 - 10.8 x10E3/uL   RBC 4.81 3.77 - 5.28 x10E6/uL   Hemoglobin 14.9 11.1 - 15.9 g/dL   Hematocrit 44.2 34.0 - 46.6 %   MCV 92 79 - 97 fL   MCH 31.0 26.6 - 33.0 pg   MCHC 33.7 31.5 - 35.7 g/dL   RDW 13.8 11.7 - 15.4 %   Platelets 337 150 - 450 x10E3/uL   Neutrophils 59 Not Estab. %   Lymphs 31 Not Estab. %   Monocytes 5 Not Estab. %   Eos 3 Not Estab. %   Basos 1 Not Estab. %   Neutrophils Absolute 4.8 1.4 - 7.0 x10E3/uL   Lymphocytes Absolute 2.5 0.7 - 3.1 x10E3/uL   Monocytes Absolute 0.4 0.1 - 0.9 x10E3/uL   EOS (ABSOLUTE) 0.2 0.0 - 0.4 x10E3/uL   Basophils Absolute 0.1 0.0 - 0.2 x10E3/uL   Immature Granulocytes 1 Not Estab. %   Immature Grans (Abs) 0.1 0.0 - 0.1 x10E3/uL  Comprehensive metabolic panel  Result Value Ref Range   Glucose 128 (H) 70 - 99 mg/dL   BUN 9 6 - 24 mg/dL   Creatinine, Ser 1.20 (H) 0.57 - 1.00 mg/dL   eGFR 53 (L) >59 mL/min/1.73   BUN/Creatinine Ratio 8 (L) 9 - 23   Sodium 140 134 - 144 mmol/L   Potassium 4.3 3.5 - 5.2 mmol/L   Chloride 100 96 - 106 mmol/L   CO2 21 20 - 29 mmol/L   Calcium 9.6 8.7 - 10.2 mg/dL   Total Protein 6.7 6.0 - 8.5 g/dL   Albumin 4.0 3.8 - 4.9 g/dL   Globulin, Total 2.7 1.5 - 4.5 g/dL   Albumin/Globulin Ratio 1.5 1.2 - 2.2   Bilirubin Total 0.2 0.0 - 1.2 mg/dL   Alkaline Phosphatase 145 (H) 44 - 121 IU/L   AST 24 0 - 40 IU/L   ALT 31 0 -  32 IU/L  Lipid panel  Result Value Ref Range   Cholesterol, Total 188 100 - 199 mg/dL   Triglycerides 467 (H) 0 - 149 mg/dL   HDL 36 (L) >39 mg/dL   VLDL Cholesterol Cal 74 (H) 5 - 40 mg/dL   LDL Chol Calc (NIH) 78 0 - 99 mg/dL   Chol/HDL Ratio 5.2 (H) 0.0 - 4.4 ratio  TSH  Result Value Ref Range   TSH 4.460 0.450 - 4.500  uIU/mL  Urinalysis, Routine w reflex microscopic  Result Value Ref Range   Specific Gravity, UA 1.025 1.005 - 1.030   pH, UA 6.0 5.0 - 7.5   Color, UA Yellow Yellow   Appearance Ur Clear Clear   Leukocytes,UA Negative Negative   Protein,UA Negative Negative/Trace   Glucose, UA Negative Negative   Ketones, UA Negative Negative   RBC, UA Trace (A) Negative   Bilirubin, UA Negative Negative   Urobilinogen, Ur 1.0 0.2 - 1.0 mg/dL   Nitrite, UA Negative Negative   Microscopic Examination See below:   HgB A1c  Result Value Ref Range   Hgb A1c MFr Bld 6.0 (H) 4.8 - 5.6 %   Est. average glucose Bld gHb Est-mCnc 126 mg/dL  T4, free  Result Value Ref Range   Free T4 1.15 0.82 - 1.77 ng/dL  Cologuard  Result Value Ref Range   COLOGUARD Negative Negative  HIV Antibody (routine testing w rflx)  Result Value Ref Range   HIV Screen 4th Generation wRfx Non Reactive Non Reactive  Hepatitis C Antibody  Result Value Ref Range   Hep C Virus Ab Non Reactive Non Reactive      Assessment & Plan:   Problem List Items Addressed This Visit   None Visit Diagnoses     Intractable 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 status migrainosus    -  Primary   Will start topamax 25mg  BID then increase to 25mg  am and 50mg  in the pm if tolerating well after 2 weeks. Dicsussed appropriate doses of Ibuprofen and tylenol to use.  Imitrex given for abortive therapy.  Side effects and benefits of medications discussed during visit.  Follow up in 1 month.  Call sooner if concerns arise.    Relevant Medications   topiramate (TOPAMAX) 25 MG tablet   SUMAtriptan (IMITREX) 50 MG tablet        Follow up plan: Return in about 1 month (around 08/08/2022) for Headaches.

## 2022-08-11 ENCOUNTER — Ambulatory Visit: Payer: 59 | Admitting: Nurse Practitioner

## 2022-08-11 NOTE — Progress Notes (Deleted)
LMP  (LMP Unknown)    Subjective:    Patient ID: Jodi Keith, female    DOB: [birth year], 57 y.o.   MRN: [id number]  HPI: Jodi Keith is a 57 y.o. female  No chief complaint on file.   Patient states her headaches are on and off for about 3 weeks.  She has been taking Ibuprofen and then 2-3 hours later she takes tylenol and then has to repeat the medications later in the day because the headache returns.  Sometimes she is breaking out in a sweat, crying and shaking- these symptoms last for a few minutes.  When the headache comes on she see squiggly lines.  She is having the headaches several times weekly.          Relevant past medical, surgical, family and social history reviewed and updated as indicated. Interim medical history since our last visit reviewed. Allergies and medications reviewed and updated.  Review of Systems  Eyes:  Positive for photophobia and visual disturbance.  Musculoskeletal:  Positive for neck pain.  Neurological:  Positive for headaches.    Per HPI unless specifically indicated above     Objective:    LMP  (LMP Unknown)   Wt Readings from Last 3 Encounters:  07/08/22 232 lb (105.2 kg)  04/28/22 231 lb 6.4 oz (105 kg)  06/25/21 226 lb 9.6 oz (102.8 kg)    Physical Exam Vitals and nursing note reviewed.  Constitutional:      General: She is not in acute distress.    Appearance: Normal appearance. She is normal weight. She is not ill-appearing, toxic-appearing or diaphoretic.  HENT:     Head: Normocephalic.     Right Ear: External ear normal.     Left Ear: External ear normal.     Nose: Nose normal.     Mouth/Throat:     Mouth: Mucous membranes are moist.     Pharynx: Oropharynx is clear.  Eyes:     General:        Right eye: No discharge.        Left eye: No discharge.     Extraocular Movements: Extraocular movements intact.     Right eye: Normal extraocular motion and no nystagmus.     Left eye: Normal extraocular motion  and no nystagmus.     Conjunctiva/sclera: Conjunctivae normal.     Pupils: Pupils are equal, round, and reactive to light.  Cardiovascular:     Rate and Rhythm: Normal rate and regular rhythm.     Heart sounds: No murmur heard. Pulmonary:     Effort: Pulmonary effort is normal. No respiratory distress.     Breath sounds: Normal breath sounds. No wheezing or rales.  Musculoskeletal:     Cervical back: Normal range of motion and neck supple.  Skin:    General: Skin is warm and dry.     Capillary Refill: Capillary refill takes less than 2 seconds.  Neurological:     General: No focal deficit present.     Mental Status: She is alert and oriented to person, place, and time. Mental status is at baseline.     Cranial Nerves: Cranial nerves 2-12 are intact.     Sensory: Sensation is intact.     Motor: Motor function is intact.     Coordination: Coordination is intact.     Gait: Gait is intact.  Psychiatric:        Mood and Affect: Mood normal.  Behavior: Behavior normal.        Thought Content: Thought content normal.        Judgment: Judgment normal.    Results for orders placed or performed in visit on 04/28/22  Microscopic Examination   Urine  Result Value Ref Range   WBC, UA 0-5 0 - 5 /hpf   RBC, Urine 0-2 0 - 2 /hpf   Epithelial Cells (non renal) 0-10 0 - 10 /hpf   Bacteria, UA None seen None seen/Few  CBC with Differential/Platelet  Result Value Ref Range   WBC 8.0 3.4 - 10.8 x10E3/uL   RBC 4.81 3.77 - 5.28 x10E6/uL   Hemoglobin 14.9 11.1 - 15.9 g/dL   Hematocrit 04.5 40.9 - 46.6 %   MCV 92 79 - 97 fL   MCH 31.0 26.6 - 33.0 pg   MCHC 33.7 31.5 - 35.7 g/dL   RDW 81.1 91.4 - 78.2 %   Platelets 337 150 - 450 x10E3/uL   Neutrophils 59 Not Estab. %   Lymphs 31 Not Estab. %   Monocytes 5 Not Estab. %   Eos 3 Not Estab. %   Basos 1 Not Estab. %   Neutrophils Absolute 4.8 1.4 - 7.0 x10E3/uL   Lymphocytes Absolute 2.5 0.7 - 3.1 x10E3/uL   Monocytes Absolute 0.4 0.1 -  0.9 x10E3/uL   EOS (ABSOLUTE) 0.2 0.0 - 0.4 x10E3/uL   Basophils Absolute 0.1 0.0 - 0.2 x10E3/uL   Immature Granulocytes 1 Not Estab. %   Immature Grans (Abs) 0.1 0.0 - 0.1 x10E3/uL  Comprehensive metabolic panel  Result Value Ref Range   Glucose 128 (H) 70 - 99 mg/dL   BUN 9 6 - 24 mg/dL   Creatinine, Ser 9.56 (H) 0.57 - 1.00 mg/dL   eGFR 53 (L) >21 HY/QMV/7.84   BUN/Creatinine Ratio 8 (L) 9 - 23   Sodium 140 134 - 144 mmol/L   Potassium 4.3 3.5 - 5.2 mmol/L   Chloride 100 96 - 106 mmol/L   CO2 21 20 - 29 mmol/L   Calcium 9.6 8.7 - 10.2 mg/dL   Total Protein 6.7 6.0 - 8.5 g/dL   Albumin 4.0 3.8 - 4.9 g/dL   Globulin, Total 2.7 1.5 - 4.5 g/dL   Albumin/Globulin Ratio 1.5 1.2 - 2.2   Bilirubin Total 0.2 0.0 - 1.2 mg/dL   Alkaline Phosphatase 145 (H) 44 - 121 IU/L   AST 24 0 - 40 IU/L   ALT 31 0 - 32 IU/L  Lipid panel  Result Value Ref Range   Cholesterol, Total 188 100 - 199 mg/dL   Triglycerides 696 (H) 0 - 149 mg/dL   HDL 36 (L) >29 mg/dL   VLDL Cholesterol Cal 74 (H) 5 - 40 mg/dL   LDL Chol Calc (NIH) 78 0 - 99 mg/dL   Chol/HDL Ratio 5.2 (H) 0.0 - 4.4 ratio  TSH  Result Value Ref Range   TSH 4.460 0.450 - 4.500 uIU/mL  Urinalysis, Routine w reflex microscopic  Result Value Ref Range   Specific Gravity, UA 1.025 1.005 - 1.030   pH, UA 6.0 5.0 - 7.5   Color, UA Yellow Yellow   Appearance Ur Clear Clear   Leukocytes,UA Negative Negative   Protein,UA Negative Negative/Trace   Glucose, UA Negative Negative   Ketones, UA Negative Negative   RBC, UA Trace (A) Negative   Bilirubin, UA Negative Negative   Urobilinogen, Ur 1.0 0.2 - 1.0 mg/dL   Nitrite, UA Negative Negative   Microscopic  Examination See below:   HgB A1c  Result Value Ref Range   Hgb A1c MFr Bld 6.0 (H) 4.8 - 5.6 %   Est. average glucose Bld gHb Est-mCnc 126 mg/dL  T4, free  Result Value Ref Range   Free T4 1.15 0.82 - 1.77 ng/dL  Cologuard  Result Value Ref Range   COLOGUARD Negative Negative  HIV  Antibody (routine testing w rflx)  Result Value Ref Range   HIV Screen 4th Generation wRfx Non Reactive Non Reactive  Hepatitis C Antibody  Result Value Ref Range   Hep C Virus Ab Non Reactive Non Reactive      Assessment & Plan:   Problem List Items Addressed This Visit   None Visit Diagnoses     Intractable 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 status migrainosus    -  Primary   Will start topamax 25mg  BID then increase to 25mg  am and 50mg  in the pm if tolerating well after 2 weeks. Dicsussed appropriate doses of Ibuprofen and tylenol to use.  Imitrex given for abortive therapy.  Side effects and benefits of medications discussed during visit.  Follow up in 1 month.  Call sooner if concerns arise.    Relevant Medications   topiramate (TOPAMAX) 25 MG tablet   SUMAtriptan (IMITREX) 50 MG tablet        Follow up plan: No follow-ups on file.

## 2022-08-26 DIAGNOSIS — F32A Depression, unspecified: Secondary | ICD-10-CM | POA: Diagnosis not present

## 2022-08-26 DIAGNOSIS — Z1239 Encounter for other screening for malignant neoplasm of breast: Secondary | ICD-10-CM | POA: Diagnosis not present

## 2022-08-26 DIAGNOSIS — G4700 Insomnia, unspecified: Secondary | ICD-10-CM | POA: Diagnosis not present

## 2022-08-26 DIAGNOSIS — Z862 Personal history of diseases of the blood and blood-forming organs and certain disorders involving the immune mechanism: Secondary | ICD-10-CM | POA: Diagnosis not present

## 2022-08-26 DIAGNOSIS — F17200 Nicotine dependence, unspecified, uncomplicated: Secondary | ICD-10-CM | POA: Diagnosis not present

## 2022-08-26 DIAGNOSIS — Z1159 Encounter for screening for other viral diseases: Secondary | ICD-10-CM | POA: Diagnosis not present

## 2022-08-26 DIAGNOSIS — F419 Anxiety disorder, unspecified: Secondary | ICD-10-CM | POA: Diagnosis not present

## 2022-09-27 ENCOUNTER — Ambulatory Visit: Payer: 59 | Admitting: Nurse Practitioner

## 2022-10-02 ENCOUNTER — Other Ambulatory Visit: Payer: Self-pay | Admitting: Nurse Practitioner

## 2022-10-04 NOTE — Telephone Encounter (Signed)
Requested Prescriptions  Pending Prescriptions Disp Refills   albuterol (VENTOLIN HFA) 108 (90 Base) MCG/ACT inhaler [Pharmacy Med Name: Albuterol Sulfate HFA 108 (90 Base) MCG/ACT Inhalation Aerosol Solution] 18 g 2    Sig: INHALE 1 PUFF BY MOUTH EVERY 6 HOURS AS NEEDED FOR WHEEZING FOR SHORTNESS OF BREATH     Pulmonology:  Beta Agonists 2 Passed - 10/02/2022 12:39 PM      Passed - Last BP in normal range    BP Readings from Last 1 Encounters:  07/08/22 134/81         Passed - Last Heart Rate in normal range    Pulse Readings from Last 1 Encounters:  07/08/22 76         Passed - Valid encounter within last 12 months    Recent Outpatient Visits           2 months ago Intractable 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 status migrainosus   [place] [facility] Larae Grooms, NP   5 months ago Annual physical exam   [place] [facility] Larae Grooms, NP   1 year ago Hypothyroidism, unspecified type   [place] [facility] Vigg, Avanti, MD   1 year ago Prediabetes   [place] [facility] Vigg, Avanti, MD   1 year ago Need for influenza vaccination   [place] [facility] [name], MD

## 2022-10-05 DIAGNOSIS — F17210 Nicotine dependence, cigarettes, uncomplicated: Secondary | ICD-10-CM | POA: Diagnosis not present

## 2022-10-05 DIAGNOSIS — I2510 Atherosclerotic heart disease of native coronary artery without angina pectoris: Secondary | ICD-10-CM | POA: Diagnosis not present

## 2022-10-05 DIAGNOSIS — Z122 Encounter for screening for malignant neoplasm of respiratory organs: Secondary | ICD-10-CM | POA: Diagnosis not present

## 2022-10-05 DIAGNOSIS — Z1231 Encounter for screening mammogram for malignant neoplasm of breast: Secondary | ICD-10-CM | POA: Diagnosis not present

## 2022-10-29 ENCOUNTER — Other Ambulatory Visit: Payer: Self-pay | Admitting: Nurse Practitioner

## 2022-11-01 NOTE — Telephone Encounter (Signed)
Labs in date  Requested Prescriptions  Pending Prescriptions Disp Refills   atorvastatin (LIPITOR) 20 MG tablet [Pharmacy Med Name: Atorvastatin Calcium 20 MG Oral Tablet] 90 tablet 0    Sig: Take 1 tablet by mouth once daily     Cardiovascular:  Antilipid - Statins Failed - 10/29/2022  3:56 PM      Failed - Lipid Panel in normal range within the last 12 months    Cholesterol, Total  Date Value Ref Range Status  04/28/2022 188 100 - 199 mg/dL Final   LDL Chol Calc (NIH)  Date Value Ref Range Status  04/28/2022 78 0 - 99 mg/dL Final   HDL  Date Value Ref Range Status  04/28/2022 36 (L) >39 mg/dL Final   Triglycerides  Date Value Ref Range Status  04/28/2022 467 (H) 0 - 149 mg/dL Final         Passed - Patient is not pregnant      Passed - Valid encounter within last 12 months    Recent Outpatient Visits           3 months ago Intractable 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 status migrainosus   [place] [facility] Larae Grooms, NP   6 months ago Annual physical exam   [place] [facility] Larae Grooms, NP   1 year ago Hypothyroidism, unspecified type   [place] [facility] Vigg, Avanti, MD   1 year ago Prediabetes   [place] [facility] Vigg, Avanti, MD   1 year ago Need for influenza vaccination   [place] Vigg, Avanti, MD               levothyroxine (SYNTHROID) 75 MCG tablet [Pharmacy Med Name: Levothyroxine Sodium 75 MCG Oral Tablet] 90 tablet 0    Sig: TAKE 1 TABLET BY MOUTH ONCE DAILY BEFORE BREAKFAST     Endocrinology:  Hypothyroid Agents Passed - 10/29/2022  3:56 PM      Passed - TSH in normal range and within 360 days    TSH  Date Value Ref Range Status  04/28/2022 4.460 0.450 - 4.500 uIU/mL Final         Passed - Valid encounter within last 12 months    Recent Outpatient Visits           3 months ago Intractable 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 status  migrainosus   [place] [facility] Larae Grooms, NP   6 months ago Annual physical exam   [place] [facility] Larae Grooms, NP   1 year ago Hypothyroidism, unspecified type   [place] [facility] Vigg, Avanti, MD   1 year ago Prediabetes   [place] [facility] Vigg, Avanti, MD   1 year ago Need for influenza vaccination   [place] [facility] Vigg, Avanti, MD               pramipexole (MIRAPEX) 0.25 MG tablet [Pharmacy Med Name: Pramipexole Dihydrochloride 0.25 MG Oral Tablet] 270 tablet 0    Sig: TAKE 1 TABLET BY MOUTH THREE TIMES DAILY     Neurology:  Parkinsonian Agents Passed - 10/29/2022  3:56 PM      Passed - Last BP in normal range    BP Readings from Last 1 Encounters:  07/08/22 134/81         Passed - Last Heart Rate in normal range    Pulse Readings from Last 1  Encounters:  07/08/22 76         Passed - Valid encounter within last 12 months    Recent Outpatient Visits           3 months ago Intractable 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 status migrainosus   [place] [facility] Larae Grooms, NP   6 months ago Annual physical exam   [place] [facility] Larae Grooms, NP   1 year ago Hypothyroidism, unspecified type   [place] [facility] Vigg, Avanti, MD   1 year ago Prediabetes   [place] [facility] Vigg, Avanti, MD   1 year ago Need for influenza vaccination   [place] [facility] Vigg, Avanti, MD               topiramate (TOPAMAX) 25 MG tablet [Pharmacy Med Name: Topiramate 25 MG Oral Tablet] 270 tablet 0    Sig: TAKE 1 TABLET BY MOUTH TWICE DAILY **AFTER  TWO  WEEKS  INCREASE  TO  1  IN  THE  MORNING  AND  2  IN  THE  EVENING     Neurology: Anticonvulsants - topiramate & zonisamide Failed - 10/29/2022  3:56 PM      Failed - Cr in normal range and within 360 days    Creatinine,  Ser  Date Value Ref Range Status  04/28/2022 1.20 (H) 0.57 - 1.00 mg/dL Final         Passed - CO2 in normal range and within 360 days    CO2  Date Value Ref Range Status  04/28/2022 21 20 - 29 mmol/L Final         Passed - ALT in normal range and within 360 days    ALT  Date Value Ref Range Status  04/28/2022 31 0 - 32 IU/L Final         Passed - AST in normal range and within 360 days    AST  Date Value Ref Range Status  04/28/2022 24 0 - 40 IU/L Final         Passed - Completed PHQ-2 or PHQ-9 in the last 360 days      Passed - Valid encounter within last 12 months    Recent Outpatient Visits           3 months ago Intractable 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 status migrainosus   [place] [facility] Larae Grooms, NP   6 months ago Annual physical exam   [place] [facility] Larae Grooms, NP   1 year ago Hypothyroidism, unspecified type   [place] [facility] Vigg, Avanti, MD   1 year ago Prediabetes   [place] [facility] Vigg, Avanti, MD   1 year ago Need for influenza vaccination   [place] [facility] [name], MD

## 2022-12-03 DIAGNOSIS — F32A Depression, unspecified: Secondary | ICD-10-CM | POA: Diagnosis not present

## 2022-12-03 DIAGNOSIS — R252 Cramp and spasm: Secondary | ICD-10-CM | POA: Diagnosis not present

## 2022-12-03 DIAGNOSIS — F419 Anxiety disorder, unspecified: Secondary | ICD-10-CM | POA: Diagnosis not present

## 2023-03-25 ENCOUNTER — Other Ambulatory Visit: Payer: Self-pay | Admitting: Nurse Practitioner

## 2023-03-28 NOTE — Telephone Encounter (Signed)
Requested Prescriptions  Pending Prescriptions Disp Refills   pramipexole (MIRAPEX) 0.25 MG tablet [Pharmacy Med Name: Pramipexole Dihydrochloride 0.25 MG Oral Tablet] 270 tablet 0    Sig: TAKE 1 TABLET BY MOUTH THREE TIMES DAILY     Neurology:  Parkinsonian Agents Passed - 03/25/2023 11:13 AM      Passed - Last BP in normal range    BP Readings from Last 1 Encounters:  07/08/22 134/81         Passed - Last Heart Rate in normal range    Pulse Readings from Last 1 Encounters:  07/08/22 76         Passed - Valid encounter within last 12 months    Recent Outpatient Visits           8 months ago Intractable 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 status migrainosus   [place] [facility] Larae Grooms, NP   11 months ago Annual physical exam   [place] [facility] Larae Grooms, NP   1 year ago Hypothyroidism, unspecified type   [place] [facility] Vigg, Avanti, MD   2 years ago Prediabetes   [place] [facility] Vigg, Avanti, MD   2 years ago Need for influenza vaccination   [facility] [facility] Vigg, Avanti, MD               atorvastatin (LIPITOR) 20 MG tablet [Pharmacy Med Name: Atorvastatin Calcium 20 MG Oral Tablet] 90 tablet 0    Sig: Take 1 tablet by mouth once daily     Cardiovascular:  Antilipid - Statins Failed - 03/25/2023 11:13 AM      Failed - Lipid Panel in normal range within the last 12 months    Cholesterol, Total  Date Value Ref Range Status  04/28/2022 188 100 - 199 mg/dL Final   LDL Chol Calc (NIH)  Date Value Ref Range Status  04/28/2022 78 0 - 99 mg/dL Final   HDL  Date Value Ref Range Status  04/28/2022 36 (L) >39 mg/dL Final   Triglycerides  Date Value Ref Range Status  04/28/2022 467 (H) 0 - 149 mg/dL Final         Passed - Patient is not pregnant      Passed - Valid encounter within last 12 months    Recent Outpatient Visits           8 months ago  Intractable 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 status migrainosus   [place] [facility] Larae Grooms, NP   11 months ago Annual physical exam   [place] [facility] Larae Grooms, NP   1 year ago Hypothyroidism, unspecified type   [place] [facility] Vigg, Avanti, MD   2 years ago Prediabetes   [place] [facility] Vigg, Avanti, MD   2 years ago Need for influenza vaccination   [place] [facility] Vigg, Avanti, MD               topiramate (TOPAMAX) 25 MG tablet [Pharmacy Med Name: Topiramate 25 MG Oral Tablet] 270 tablet 0    Sig: TAKE 1 TABLET BY MOUTH TWICE DAILY FOR 2 WEEKS, THEN INCREASE TO 1 TAB IN THE MORNING AND 2 TABS IN THE EVENING     Neurology: Anticonvulsants - topiramate & zonisamide Failed - 03/25/2023 11:13 AM      Failed - Cr in normal range and within 360 days    Creatinine, Ser  Date Value Ref Range Status  04/28/2022 1.20 (H) 0.57 - 1.00 mg/dL Final         Passed - CO2 in normal range and within 360 days    CO2  Date Value Ref Range Status  04/28/2022 21 20 - 29 mmol/L Final         Passed - ALT in normal range and within 360 days    ALT  Date Value Ref Range Status  04/28/2022 31 0 - 32 IU/L Final         Passed - AST in normal range and within 360 days    AST  Date Value Ref Range Status  04/28/2022 24 0 - 40 IU/L Final         Passed - Completed PHQ-2 or PHQ-9 in the last 360 days      Passed - Valid encounter within last 12 months    Recent Outpatient Visits           8 months ago Intractable 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 status migrainosus   [place] [facility] Larae Grooms, NP   11 months ago Annual physical exam   [place] [facility] Larae Grooms, NP   1 year ago Hypothyroidism, unspecified type   [place] [facility] Vigg, Avanti, MD   2 years ago Prediabetes   [place] [facility] Vigg, Avanti, MD   2 years ago Need for influenza vaccination   [place] [facility] [name], MD              Patient will need an office visit for further refills.

## 2023-04-28 ENCOUNTER — Encounter: Payer: Self-pay | Admitting: *Deleted

## 2023-05-13 DIAGNOSIS — N3001 Acute cystitis with hematuria: Secondary | ICD-10-CM | POA: Diagnosis not present

## 2023-05-13 DIAGNOSIS — Z7951 Long term (current) use of inhaled steroids: Secondary | ICD-10-CM | POA: Diagnosis not present

## 2023-05-13 DIAGNOSIS — R1012 Left upper quadrant pain: Secondary | ICD-10-CM | POA: Diagnosis not present

## 2023-05-13 DIAGNOSIS — E669 Obesity, unspecified: Secondary | ICD-10-CM | POA: Diagnosis not present

## 2023-05-13 DIAGNOSIS — R109 Unspecified abdominal pain: Secondary | ICD-10-CM | POA: Diagnosis not present

## 2023-05-13 DIAGNOSIS — Z79899 Other long term (current) drug therapy: Secondary | ICD-10-CM | POA: Diagnosis not present

## 2023-05-13 DIAGNOSIS — Z888 Allergy status to other drugs, medicaments and biological substances status: Secondary | ICD-10-CM | POA: Diagnosis not present

## 2023-05-13 DIAGNOSIS — I509 Heart failure, unspecified: Secondary | ICD-10-CM | POA: Diagnosis not present

## 2023-05-13 DIAGNOSIS — E872 Acidosis, unspecified: Secondary | ICD-10-CM | POA: Diagnosis not present

## 2023-05-13 DIAGNOSIS — N200 Calculus of kidney: Secondary | ICD-10-CM | POA: Diagnosis not present

## 2023-05-13 DIAGNOSIS — R1900 Intra-abdominal and pelvic swelling, mass and lump, unspecified site: Secondary | ICD-10-CM | POA: Diagnosis not present

## 2023-05-13 DIAGNOSIS — I2510 Atherosclerotic heart disease of native coronary artery without angina pectoris: Secondary | ICD-10-CM | POA: Diagnosis not present

## 2023-05-13 DIAGNOSIS — F17210 Nicotine dependence, cigarettes, uncomplicated: Secondary | ICD-10-CM | POA: Diagnosis not present

## 2023-05-13 DIAGNOSIS — J45909 Unspecified asthma, uncomplicated: Secondary | ICD-10-CM | POA: Diagnosis not present

## 2023-08-03 DIAGNOSIS — K2970 Gastritis, unspecified, without bleeding: Secondary | ICD-10-CM | POA: Diagnosis not present

## 2023-08-03 DIAGNOSIS — B9681 Helicobacter pylori [H. pylori] as the cause of diseases classified elsewhere: Secondary | ICD-10-CM | POA: Diagnosis not present

## 2023-09-15 DIAGNOSIS — Z8249 Family history of ischemic heart disease and other diseases of the circulatory system: Secondary | ICD-10-CM | POA: Diagnosis not present

## 2023-09-15 DIAGNOSIS — M1990 Unspecified osteoarthritis, unspecified site: Secondary | ICD-10-CM | POA: Diagnosis not present

## 2023-09-15 DIAGNOSIS — Z791 Long term (current) use of non-steroidal anti-inflammatories (NSAID): Secondary | ICD-10-CM | POA: Diagnosis not present

## 2023-09-15 DIAGNOSIS — I2510 Atherosclerotic heart disease of native coronary artery without angina pectoris: Secondary | ICD-10-CM | POA: Diagnosis not present

## 2023-09-15 DIAGNOSIS — K219 Gastro-esophageal reflux disease without esophagitis: Secondary | ICD-10-CM | POA: Diagnosis not present

## 2023-09-15 DIAGNOSIS — F325 Major depressive disorder, single episode, in full remission: Secondary | ICD-10-CM | POA: Diagnosis not present

## 2023-09-15 DIAGNOSIS — Z809 Family history of malignant neoplasm, unspecified: Secondary | ICD-10-CM | POA: Diagnosis not present

## 2023-09-15 DIAGNOSIS — M62838 Other muscle spasm: Secondary | ICD-10-CM | POA: Diagnosis not present

## 2023-09-15 DIAGNOSIS — E039 Hypothyroidism, unspecified: Secondary | ICD-10-CM | POA: Diagnosis not present

## 2023-09-15 DIAGNOSIS — Z833 Family history of diabetes mellitus: Secondary | ICD-10-CM | POA: Diagnosis not present

## 2023-09-15 DIAGNOSIS — J4489 Other specified chronic obstructive pulmonary disease: Secondary | ICD-10-CM | POA: Diagnosis not present

## 2023-09-20 ENCOUNTER — Other Ambulatory Visit: Payer: Self-pay | Admitting: Nurse Practitioner

## 2023-09-21 NOTE — Telephone Encounter (Signed)
 No longer current at [facility] Requested Prescriptions  Pending Prescriptions Disp Refills   pramipexole  (MIRAPEX ) 0.25 MG tablet [Pharmacy Med Name: Pramipexole  Dihydrochloride 0.25 MG Oral Tablet] 270 tablet 0    Sig: TAKE 1 TABLET BY MOUTH THREE TIMES DAILY . APPOINTMENT REQUIRED FOR FUTURE REFILLS     Neurology:  Parkinsonian Agents Failed - 09/21/2023 10:26 AM      Failed - Valid encounter within last 12 months    Recent Outpatient Visits   None            Passed - Last BP in normal range    BP Readings from Last 1 Encounters:  07/08/22 134/81         Passed - Last Heart Rate in normal range    Pulse Readings from Last 1 Encounters:  07/08/22 76

## 2023-09-27 DIAGNOSIS — K2970 Gastritis, unspecified, without bleeding: Secondary | ICD-10-CM | POA: Diagnosis not present

## 2023-09-27 DIAGNOSIS — K259 Gastric ulcer, unspecified as acute or chronic, without hemorrhage or perforation: Secondary | ICD-10-CM | POA: Diagnosis not present

## 2023-09-29 DIAGNOSIS — R600 Localized edema: Secondary | ICD-10-CM | POA: Diagnosis not present

## 2023-10-09 IMAGING — DX DG LUMBAR SPINE COMPLETE 4+V
6 series · 6 of 6 positions shown · non-contrast
Comparison: 11/12/2020

CLINICAL DATA: Low back pain with right-sided radiculopathy

EXAM:
LUMBAR SPINE - COMPLETE 4+ VIEW

[l-spine obl (1 of 3)]
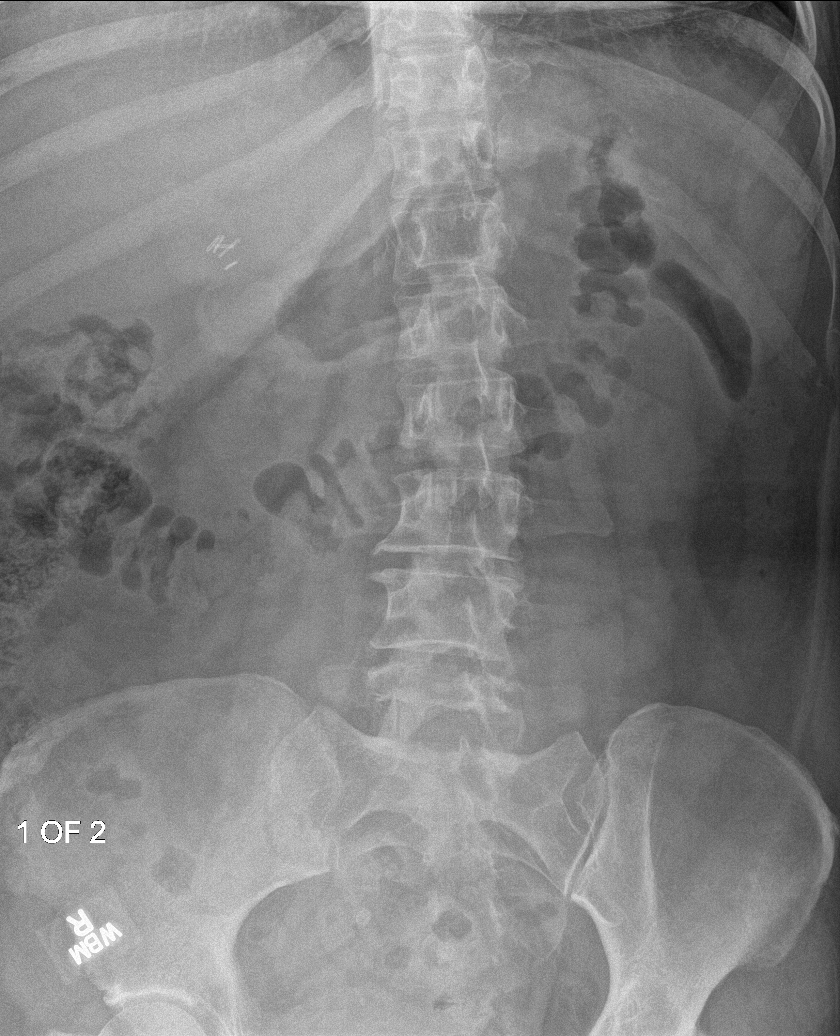

[l-spine obl (2 of 3)]
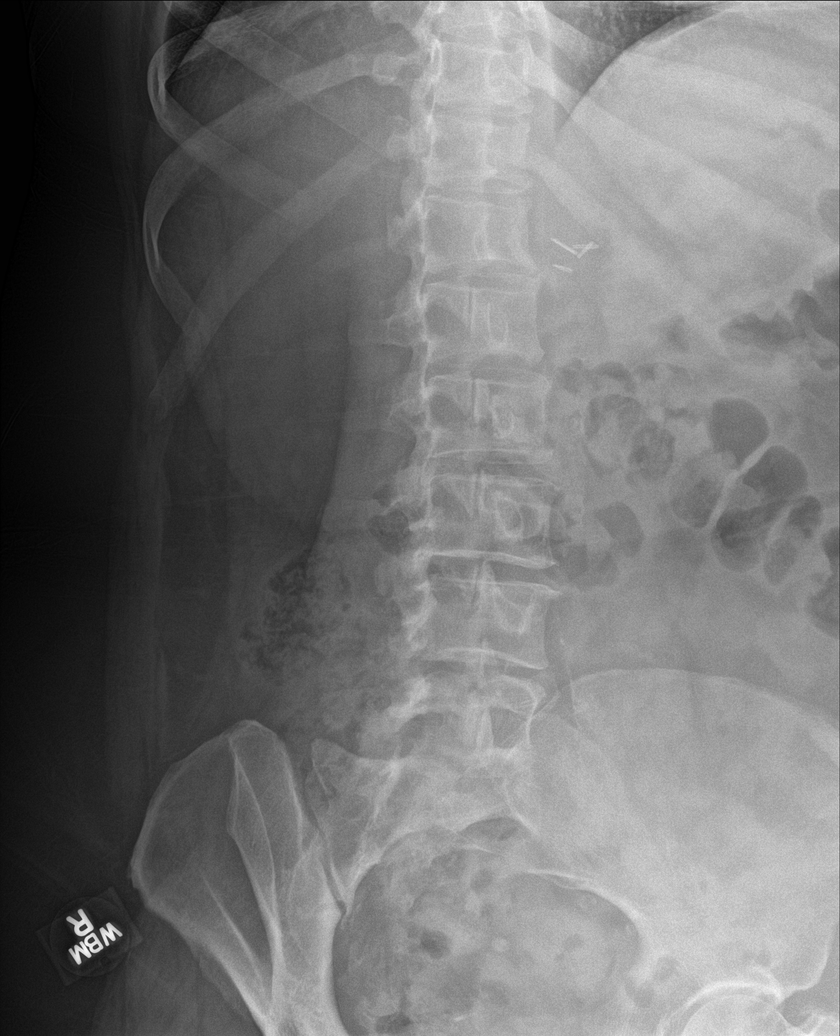

[l-spine lat]
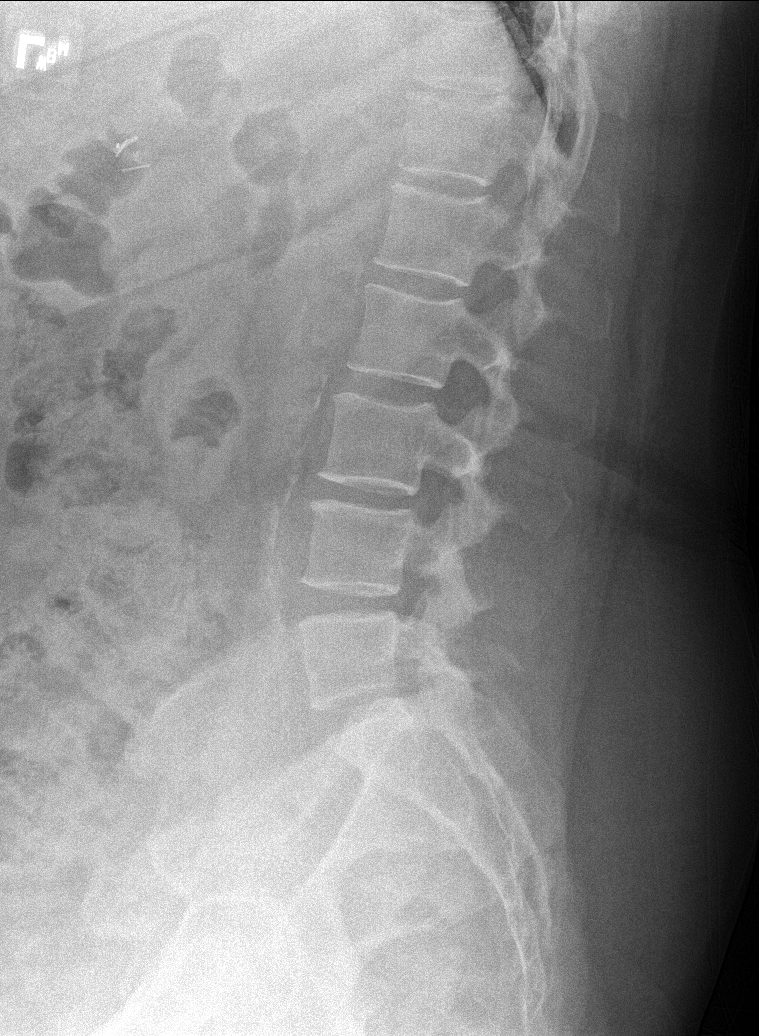

[l-spine spot]
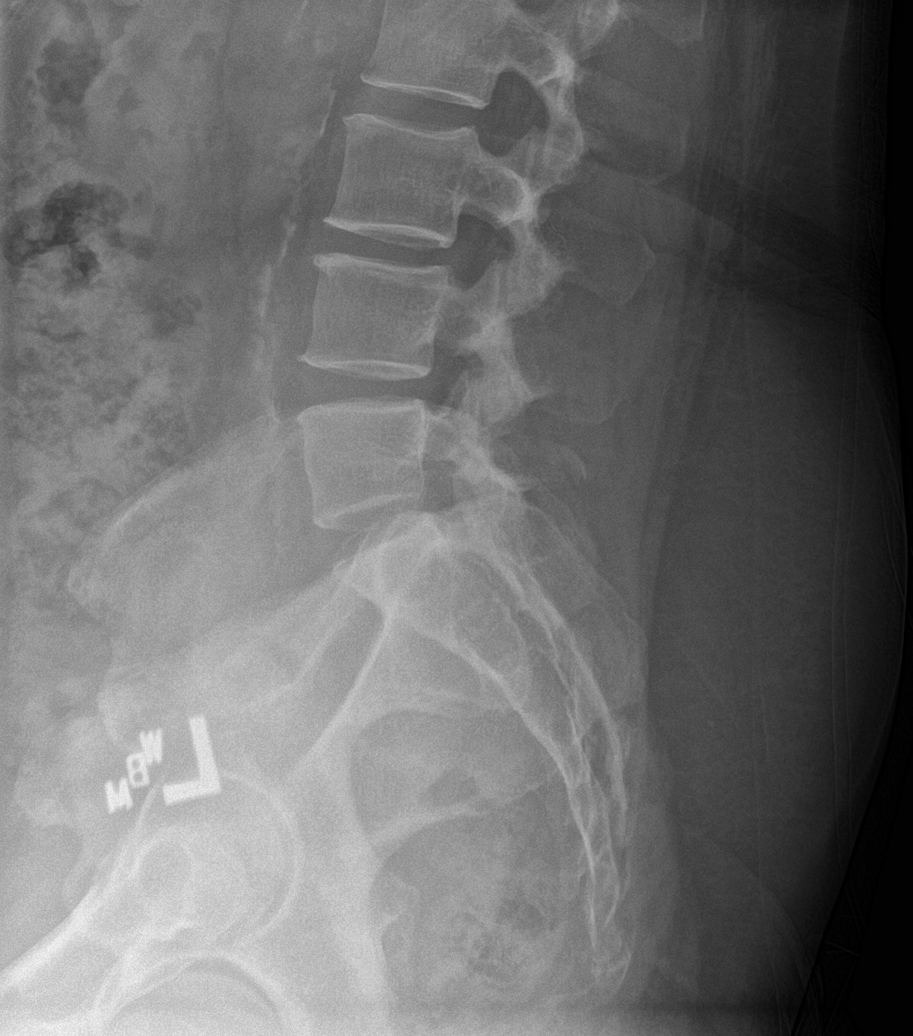

[l-spine ap]
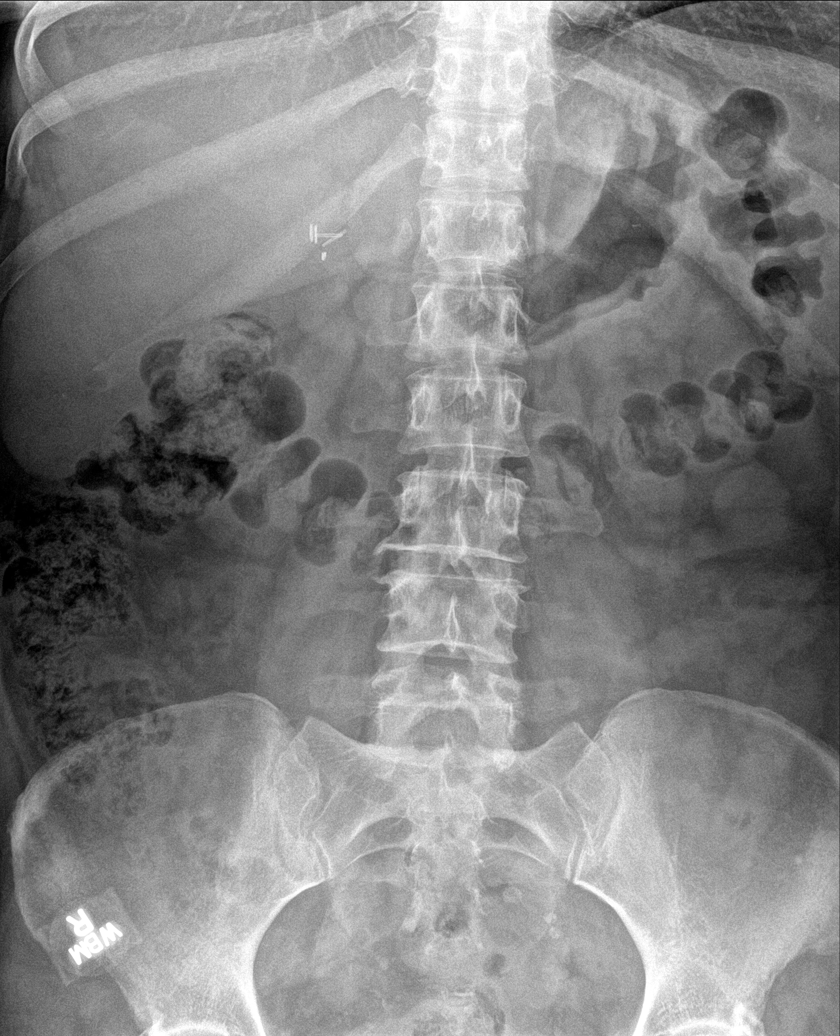

[l-spine obl (3 of 3)]
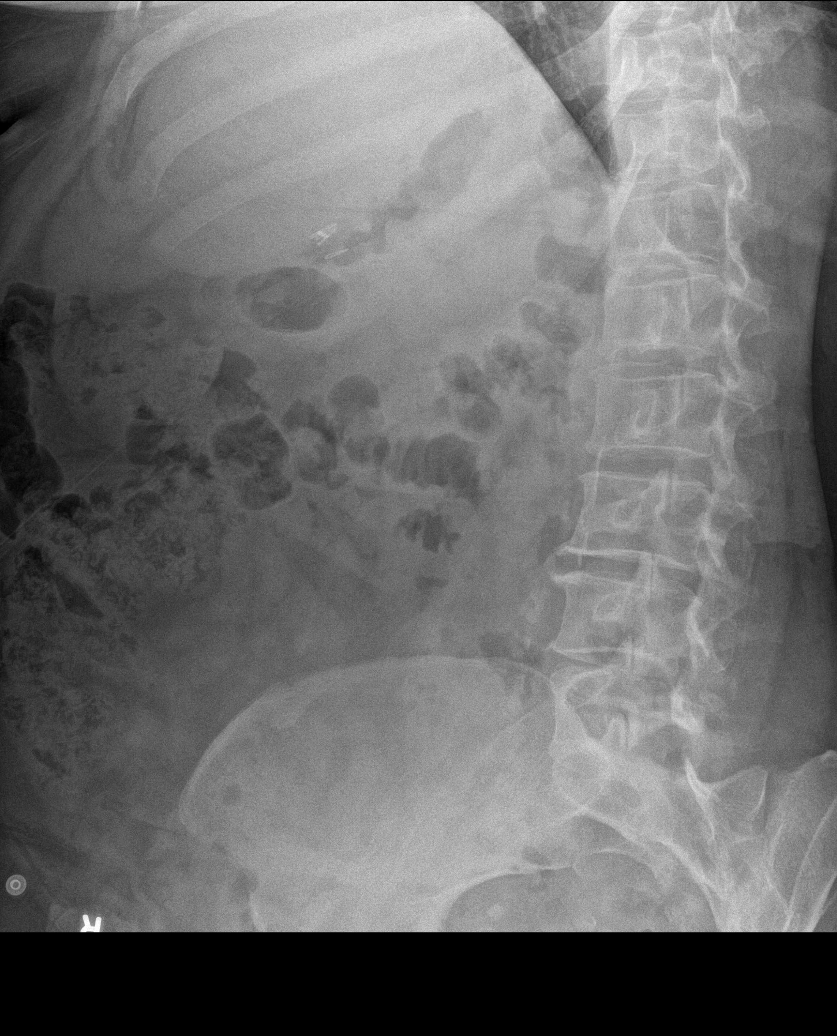

[6 of 6 positions shown; findings below may reference images not displayed]

FINDINGS: There is no evidence of lumbar spine fracture. Alignment is normal.
Intervertebral disc spaces are maintained. Facet joints appear
within normal limits.
IMPRESSION: Negative.

## 2023-10-20 DIAGNOSIS — I774 Celiac artery compression syndrome: Secondary | ICD-10-CM | POA: Diagnosis not present

## 2023-10-20 DIAGNOSIS — I701 Atherosclerosis of renal artery: Secondary | ICD-10-CM | POA: Diagnosis not present

## 2023-10-20 DIAGNOSIS — K760 Fatty (change of) liver, not elsewhere classified: Secondary | ICD-10-CM | POA: Diagnosis not present

## 2023-10-28 DIAGNOSIS — N950 Postmenopausal bleeding: Secondary | ICD-10-CM | POA: Diagnosis not present

## 2023-11-21 DIAGNOSIS — N950 Postmenopausal bleeding: Secondary | ICD-10-CM | POA: Diagnosis not present

## 2024-01-16 DIAGNOSIS — N83201 Unspecified ovarian cyst, right side: Secondary | ICD-10-CM | POA: Diagnosis not present

## 2024-01-16 DIAGNOSIS — D270 Benign neoplasm of right ovary: Secondary | ICD-10-CM | POA: Diagnosis not present

## 2024-01-16 DIAGNOSIS — Z6841 Body Mass Index (BMI) 40.0 and over, adult: Secondary | ICD-10-CM | POA: Diagnosis not present

## 2024-01-16 DIAGNOSIS — N8501 Benign endometrial hyperplasia: Secondary | ICD-10-CM | POA: Diagnosis not present

## 2024-01-16 DIAGNOSIS — N189 Chronic kidney disease, unspecified: Secondary | ICD-10-CM | POA: Diagnosis not present

## 2024-01-16 DIAGNOSIS — N8502 Endometrial intraepithelial neoplasia [EIN]: Secondary | ICD-10-CM | POA: Diagnosis not present

## 2024-01-16 DIAGNOSIS — I509 Heart failure, unspecified: Secondary | ICD-10-CM | POA: Diagnosis not present

## 2024-01-16 DIAGNOSIS — I130 Hypertensive heart and chronic kidney disease with heart failure and stage 1 through stage 4 chronic kidney disease, or unspecified chronic kidney disease: Secondary | ICD-10-CM | POA: Diagnosis not present

## 2024-01-16 DIAGNOSIS — E66813 Obesity, class 3: Secondary | ICD-10-CM | POA: Diagnosis not present

## 2024-01-16 DIAGNOSIS — Z9851 Tubal ligation status: Secondary | ICD-10-CM | POA: Diagnosis not present

## 2024-01-16 DIAGNOSIS — N8500 Endometrial hyperplasia, unspecified: Secondary | ICD-10-CM | POA: Diagnosis not present
